# Patient Record
Sex: Female | Born: 1950 | Race: White | Hispanic: No | Marital: Married | Smoking: Never smoker
Health system: Southern US, Community
[De-identification: ages and names within clinical notes are randomized; demographics above are authoritative.]

## PROBLEM LIST (undated history)

## (undated) DIAGNOSIS — C541 Malignant neoplasm of endometrium: Secondary | ICD-10-CM

## (undated) DIAGNOSIS — E039 Hypothyroidism, unspecified: Secondary | ICD-10-CM

## (undated) DIAGNOSIS — Z923 Personal history of irradiation: Secondary | ICD-10-CM

## (undated) DIAGNOSIS — N9489 Other specified conditions associated with female genital organs and menstrual cycle: Secondary | ICD-10-CM

## (undated) DIAGNOSIS — M19071 Primary osteoarthritis, right ankle and foot: Secondary | ICD-10-CM

## (undated) DIAGNOSIS — G629 Polyneuropathy, unspecified: Secondary | ICD-10-CM

## (undated) DIAGNOSIS — G709 Myoneural disorder, unspecified: Secondary | ICD-10-CM

## (undated) DIAGNOSIS — C801 Malignant (primary) neoplasm, unspecified: Secondary | ICD-10-CM

## (undated) DIAGNOSIS — N950 Postmenopausal bleeding: Secondary | ICD-10-CM

## (undated) HISTORY — PX: COSMETIC SURGERY: SHX468

## (undated) HISTORY — PX: ABDOMINAL HYSTERECTOMY: SHX81

## (undated) HISTORY — DX: Personal history of irradiation: Z92.3

## (undated) HISTORY — PX: ABDOMINOPLASTY: SUR9

## (undated) HISTORY — PX: COLONOSCOPY: SHX174

## (undated) HISTORY — PX: APPENDECTOMY: SHX54

---

## 1974-03-22 HISTORY — PX: OVARIAN CYST REMOVAL: SHX89

## 1996-03-22 DIAGNOSIS — C801 Malignant (primary) neoplasm, unspecified: Secondary | ICD-10-CM

## 1996-03-22 HISTORY — DX: Malignant (primary) neoplasm, unspecified: C80.1

## 1996-04-22 HISTORY — PX: THYROIDECTOMY: SHX17

## 1997-08-22 ENCOUNTER — Other Ambulatory Visit: Admission: RE | Admit: 1997-08-22 | Discharge: 1997-08-22 | Payer: Self-pay | Admitting: Obstetrics and Gynecology

## 1997-11-03 ENCOUNTER — Inpatient Hospital Stay (HOSPITAL_COMMUNITY): Admission: AD | Admit: 1997-11-03 | Discharge: 1997-11-06 | Payer: Self-pay | Admitting: Obstetrics and Gynecology

## 1999-01-01 ENCOUNTER — Other Ambulatory Visit: Admission: RE | Admit: 1999-01-01 | Discharge: 1999-01-01 | Payer: Self-pay | Admitting: Obstetrics and Gynecology

## 2000-08-02 ENCOUNTER — Other Ambulatory Visit: Admission: RE | Admit: 2000-08-02 | Discharge: 2000-08-02 | Payer: Self-pay | Admitting: Obstetrics and Gynecology

## 2000-11-07 ENCOUNTER — Encounter: Payer: Self-pay | Admitting: Neurology

## 2000-11-07 ENCOUNTER — Ambulatory Visit (HOSPITAL_COMMUNITY): Admission: RE | Admit: 2000-11-07 | Discharge: 2000-11-07 | Payer: Self-pay | Admitting: Neurology

## 2001-09-13 ENCOUNTER — Ambulatory Visit (HOSPITAL_COMMUNITY): Admission: RE | Admit: 2001-09-13 | Discharge: 2001-09-13 | Payer: Self-pay | Admitting: Gastroenterology

## 2004-12-10 ENCOUNTER — Encounter (INDEPENDENT_AMBULATORY_CARE_PROVIDER_SITE_OTHER): Payer: Self-pay | Admitting: Specialist

## 2004-12-10 ENCOUNTER — Ambulatory Visit (HOSPITAL_COMMUNITY): Admission: RE | Admit: 2004-12-10 | Discharge: 2004-12-10 | Payer: Self-pay | Admitting: Obstetrics and Gynecology

## 2007-09-06 ENCOUNTER — Ambulatory Visit (HOSPITAL_BASED_OUTPATIENT_CLINIC_OR_DEPARTMENT_OTHER): Admission: RE | Admit: 2007-09-06 | Discharge: 2007-09-06 | Payer: Self-pay | Admitting: Orthopedic Surgery

## 2008-09-16 ENCOUNTER — Encounter: Admission: RE | Admit: 2008-09-16 | Discharge: 2008-09-16 | Payer: Self-pay | Admitting: Orthopedic Surgery

## 2010-08-04 NOTE — Op Note (Signed)
NAMENABILAH, [name]                   ACCOUNT NO.:  [id number]   MEDICAL RECORD NO.:  [id number]          PATIENT TYPE:  AMB   LOCATION:  DSC                          FACILITY:  [facility]   PHYSICIAN:  Feliberto Gottron. Turner Daniels, M.D.   DATE OF BIRTH:  [birth year]   DATE OF PROCEDURE:  09/06/2007  DATE OF DISCHARGE:  09/06/2007                               OPERATIVE REPORT   PREOPERATIVE DIAGNOSIS:  Left knee chondromalacia.   POSTOPERATIVE DIAGNOSIS:  Left knee chondromalacia patella, grade 2-3,  apex.   PROCEDURE:  Left knee arthroscopic debridement, chondromalacia patella.   SURGEON:  Feliberto Gottron.  Turner Daniels, MD   FIRST ASSISTANT:  None.   ANESTHETIC:  Local IV sedation plus general LMA.   ESTIMATED BLOOD LOSS:  Minimal.   FLUID REPLACEMENT:  500 mL of crystalloid.   DRAINS PLACED:  None.   TOURNIQUET TIME:  None.   INDICATIONS FOR PROCEDURE:  A 60 year old woman with symptomatic  catching and popping in the subpatellar region of her left knee that has  failed conservative treatment and desires elective arthroscopic  evaluation and treatment of her knee.  Risks and benefits of surgery  were discussed and questions were answered.   DESCRIPTION OF PROCEDURE:  The patient was identified by armband, taken  to the operating room at [facility].  Appropriate  anesthetic monitors were attached, and general LMA anesthesia induced  with the patient in supine position.  She also had local anesthesia.  A  total of 20 mL of 0.5% Marcaine with epinephrine was injected into the  knee and into the inferomedial and inferolateral parapatellar portal  regions after a time-out procedure had been performed.  After the  induction of general anesthesia and administration of the local  anesthetic under sterile conditions, lateral post was applied to the  table, and the left lower extremity was prepped and draped in sterile  fashion from the ankle to the midthigh.  Using a #11 blade, standard  inferomedial and inferolateral parapatellar portals were then made  allowing introduction of the arthroscope through the inferolateral  portal and the outflow through the inferomedial portal.  Diagnostic  arthroscopy revealed focal grade II-III chondromalacia at the apex of  the patella, which was lightly debrided, and some anterior synovium was  incidentally removed.  Moving into the medial compartment, the articular  and meniscal cartilages were in excellent condition.  Moving into the  notch, the ACL and PCL were noted to be in good shape, and moving  laterally, the articular and meniscal cartilages were in excellent  condition.  The gutters were cleared medially and laterally.  The  arthroscopic instruments were  then positioned, so we looked into the posterior compartments of the  knee.  The knee was irrigated out with normal saline solution.  Instruments were removed.  A dressing of Xeroform, 4 x 4 dressing  sponges, Webril, and an Ace wrap applied.  The patient was then awakened  and taken to the recovery room without difficulty.      Feliberto Gottron. Turner Daniels, M.D.  Electronically Signed  FJR/MEDQ  D:  09/25/2007  T:  09/26/2007  Job:  [id number]

## 2010-08-07 NOTE — Procedures (Signed)
[place]  Patient:    [name], [name] Visit Number: [id number] MRN: [id number]          Service Type: END Location: ENDO Attending Physician:  Charna Elizabeth Dictated by:   Anselmo Rod, M.D. Proc. Date: 09/13/01 Admit Date:  09/13/2001   CC:         Cordelia Pen A. Rosalio Macadamia, M.D.  Dr. Lanora Manis ________   Procedure Report  DATE OF BIRTH:  Jan 04, 1951  REFERRING PHYSICIAN:  Cordelia Pen A. Rosalio Macadamia, M.D.  PROCEDURE PERFORMED:  Colonoscopy.  ENDOSCOPIST:  Anselmo Rod, M.D.  INSTRUMENT USED:  Olympus video colonoscope.  INDICATIONS FOR PROCEDURE:  The patient is a 60 year old white female undergoing screening colonoscopy.  Rule out colonic polyps, masses, hemorrhoids, etc.  PREPROCEDURE PREPARATION:  Informed consent was procured from the patient. The patient was fasted for eight hours prior to the procedure and prepped with a bottle of magnesium citrate and a gallon of NuLytely the night prior to the procedure.  PREPROCEDURE PHYSICAL:  The patient had stable vital signs.  Neck supple. Chest clear to auscultation.  S1, S2 regular.  Abdomen soft with normal bowel sounds.  DESCRIPTION OF PROCEDURE:  The patient was placed in the left lateral decubitus position and sedated with 100 mg of Demerol and 10 mg of Versed intravenously.  Once the patient was adequately sedated and maintained on low-flow oxygen and continuous cardiac monitoring, the Olympus video colonoscope was advanced from the rectum to the cecum with slight difficulty. There was some residual stool in the colon.  Multiple washes were done.  There was evidence of mild diffuse melanosis coli throughout the colon.  No masses, polyps, erosions, ulcerations or diverticula were seen.  The procedure was complete up to the cecum.  The appendiceal orifice and ileocecal valve were clearly visualized and photographed.  Small lesions could have been missed secondary to the residual  stool in the colon.  IMPRESSION: 1. No masses, polyps or diverticulosis noted. 2. Mild evidence of melanosis coli throughout the colonic mucosa. 3. Some residual stool in the colon.  Small lesions could have been missed.  RECOMMENDATIONS: 1. A high fiber diet was recommended along with liberal fluid intact. 2. Avoidance of all laxatives has been encouraged.  Stool softeners are to be    used as needed. 3. Repeat colorectal cancer screening is recommended in the next five to 10    years unless the patient develops any abnormal symptoms in the interim. Dictated by:   Anselmo Rod, M.D. Attending Physician:  Charna Elizabeth DD:  09/13/01 TD:  09/14/01 Job: [id number]

## 2010-08-07 NOTE — Op Note (Signed)
[name], [name]                   ACCOUNT NO.:  [id number]   MEDICAL RECORD NO.:  [id number]          PATIENT TYPE:  AMB   LOCATION:  SDC                           FACILITY:  [facility]   PHYSICIAN:  Sherry A. Dickstein, M.D.DATE OF BIRTH:  12-05-1950   DATE OF PROCEDURE:  12/10/2004  DATE OF DISCHARGE:                                 OPERATIVE REPORT   PREOPERATIVE DIAGNOSIS:  Hydrosalpinx.   POSTOPERATIVE DIAGNOSIS:  Hydrosalpinx.   PROCEDURE:  Laparoscopic bilateral salpingectomy.   SURGEONS:  Sherry A. Rosalio Macadamia, M.D., Chester Holstein. Earlene Plater, M.D.   ANESTHESIA:  General.   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  Because of the chronic nature of  the problem, the patient has requested removal of her fallopian tubes.  However, she has requested the conservative surgery.  The patient requests  leaving her ovary in place and leaving her uterus in place.   FINDINGS:  Slightly enlarged anteflexed uterus, right hydrosalpinx.  No  right ovary present.  Left hydrosalpinx with left ovary present.  Sigmoid  colon adhesions to left fallopian tube.   PROCEDURE:  The patient was brought into the operating room and given  adequate general anesthesia after slightly difficult intubation that was  long but very successful.  The patient was placed in a dorsal lithotomy  position.  Her abdomen and the vagina were washed with Betadine, a Foley  catheter was inserted into the bladder, a speculum was placed within the  vagina.  Cervix was grasped with a single-toothed tenaculum, single-tooth  Hulka tenaculum was placed on the cervix.  First tenaculum and speculum were  removed from the vagina.  Surgeon's gown and gloves were changed.  The  patient was draped in sterile fashion.  Subumbilical area was infiltrated  0.25%% Marcaine.  Incision was made under the umbilicus.  The fascia was identified and grasped with Kocher clamps.  The  fascia was  incised, fascial edges were identified and a pursestring stitch was taken  with 0 Vicryl.  The posterior sheath was identified and incised sharply and  the Hulka trocar was introduced into the peritoneal cavity.  This was  cinched down with a Vicryl suture.  Carbon dioxide side was insufflated.  The pelvis was inspected and it was felt that the hydrosalpinx could be  removed through the laparoscope.  A right median incision was made after  infiltrating with 0.25% Marcaine and under direct visualization, a 5 mm  trocar was introduced.  A left median incision was made after infiltrating  with 0.25% Marcaine and 10/11 trocar was introduced with a Z fashion under  direct visualization.  The right infundibulopelvic ligament was identified.  It was grasped well above the ureter and using tripolar cautery was  cauterized in three places and cut.  The mesosalpinx was cauterized and cut  across to the ovary until the fallopian tube was free.  The fallopian tube  was then placed in an EndoCatch bag through the left median incision and  brought through the abdominal  cavity.  It had to be ruptured to be able to  be removed through the EndoCatch bag, and it was sent separately.  The  trocar was then replaced into the abdominal cavity.  The left fallopian tube  had to be dissected free very carefully with Endoshears from the left  sigmoid colon.  This was done with blunt and sharp dissection. The ovary was  dissected free.  The blood supply to the left fallopian tube was cauterized  with tripolar cautery and the fallopian tube was dissected free from the  ovary with tripolar cautery until it was completely separate.  It was then  put in a bag and the fluid was opened while in the bag and suctioned free,  and the bag was removed to the lateral port.  The fallopian tube was sent  separately.  There was very small amount of bleeding from the sigmoid colon  mesentery where it had been dissected  from the fallopian tube.  This was  felt to be very minimal.  The left ovary was bleeding very minimally, and  this was cauterized.  The uterus was felt to be normal.  The entire pelvis  was visualized and felt to be normal.  No other abnormalities were seen.  The upper abdomen was inspected and felt to be normal.  The pelvis was  irrigated and suctioned.  All carbon dioxide was then allowed to escape.  The incisions were then closed.  The subumbilical incision was closed first  by tying the Vicryl pursestring stitch, and all skin incisions were closed  using Monocryl subcuticular running stitches.  The skin was then closed  using Dermabond.  Adequate hemostasis was present throughout.  The trocar  was removed from the vagina.  The Foley catheter was removed.  The patient  taken out of the dorsal lithotomy position.  She was awakened.  She was  moved from the operating table to a stretcher in the stable condition.  Complications were none.  Estimated blood loss was less than 5 mL.      Sherry A. Rosalio Macadamia, M.D.  Electronically Signed     SAD/MEDQ  D:  12/10/2004  T:  12/10/2004  Job:  [id number]

## 2010-12-17 LAB — POCT HEMOGLOBIN-HEMACUE: Hemoglobin: 13.1

## 2012-02-29 ENCOUNTER — Other Ambulatory Visit: Payer: Self-pay | Admitting: Plastic Surgery

## 2015-03-23 HISTORY — PX: SHOULDER SURGERY: SHX246

## 2016-02-09 ENCOUNTER — Other Ambulatory Visit: Payer: Self-pay | Admitting: Orthopedic Surgery

## 2016-02-25 ENCOUNTER — Encounter (HOSPITAL_BASED_OUTPATIENT_CLINIC_OR_DEPARTMENT_OTHER): Payer: Self-pay | Admitting: *Deleted

## 2016-03-02 NOTE — H&P (Signed)
[name] is an 65 y.o. female.    Chief Complaint: Right Great Toe Pain  HPI: [name] is a 65 year old female that is presenting with right great toe pain.  This pain has been ongoing for the past 6 months.  She noticed it more when she was running on a treadmill after her shoulder surgery.  She denies any prior surgery in this area.  She has redness and swelling after she runs on this foot.  She has a history of idiopathic neuropathy.  She has pain over the first MTP joint of the right foot.  Past Medical History:  Diagnosis Date  . Cancer ([place]) 1998   thyroid ca  . Degenerative arthritis of toe joint, right    great toe  . Hypothyroidism   . Neuropathy ([place])    feet    Past Surgical History:  Procedure Laterality Date  . ABDOMINOPLASTY    . APPENDECTOMY    . COSMETIC SURGERY    . OVARIAN CYST REMOVAL  1976  . SHOULDER SURGERY Right 03/2015   by dr [name] at [facility]      History reviewed. No pertinent family history. Social History:  reports that she has never smoked. She has never used smokeless tobacco. She reports that she drinks alcohol. She reports that she does not use drugs.  Allergies: No Known Allergies  No prescriptions prior to admission.    No results found for this or any previous visit (from the past 48 hour(s)). No results found.  Review of Systems  Constitutional: Negative.   HENT: Negative.   Eyes:       Reading glasses  Respiratory: Negative.   Cardiovascular: Negative.   Gastrointestinal: Negative.   Genitourinary: Negative.   Musculoskeletal: Positive for joint pain.  Skin: Negative.   Neurological: Negative.   Endo/Heme/Allergies: Negative.   Psychiatric/Behavioral: Negative.     Height 5\' 9"  (1.753 m), weight 68 kg (150 lb). Physical Exam  Constitutional: She is oriented to person, place, and time. She appears well-developed and well-nourished.  HENT:  Head: Normocephalic and atraumatic.  Eyes: Pupils are equal, round,  and reactive to light.  Neck: Normal range of motion. Neck supple.  Cardiovascular: Intact distal pulses.   Respiratory: Effort normal.  Musculoskeletal: She exhibits tenderness.  Large dorsal osteophyte, Redness over the first MTP joint.  Limited flexion and extension of that first MTP joint.  Slight hallux valgus.  Some loss of the transverse arch.  Longitudinal arch seems to be preserved.  No abnormal callus formation on the plantar aspect.  Normal range of motion of the ankle.  Normal strength.  Neurovascularly intact.  Neurological: She is alert and oriented to person, place, and time.  Skin: Skin is warm and dry.  Psychiatric: She has a normal mood and affect. Her behavior is normal. Judgment and thought content normal.     Assessment/Plan Assessment: Right great toe pain likely related to hallux valgus and arthritic change.   Plan: She was provided and injection of her first MTP joint.  She would also like to be placed on the schedule for consideration of surgery today for right great toe chielectomy. The benefits risks and potential complications of surgery are discussed. The risks include but are not limited to infection,blood clots, difficulty with anesthesia, and damage to bone tendon nerves and soft tissues. She wishes to proceed with surgery. She is to follow-up postop.  Demitri Kucinski R, PA-C 03/02/2016, 8:02 AM

## 2016-03-03 ENCOUNTER — Encounter (HOSPITAL_BASED_OUTPATIENT_CLINIC_OR_DEPARTMENT_OTHER): Payer: Self-pay | Admitting: Anesthesiology

## 2016-03-03 ENCOUNTER — Ambulatory Visit (HOSPITAL_BASED_OUTPATIENT_CLINIC_OR_DEPARTMENT_OTHER)
Admission: RE | Admit: 2016-03-03 | Discharge: 2016-03-03 | Disposition: A | Discharge status: Home / Self Care | Payer: BC Managed Care – PPO | Source: Ambulatory Visit | Attending: Orthopedic Surgery | Admitting: Orthopedic Surgery

## 2016-03-03 ENCOUNTER — Encounter (HOSPITAL_BASED_OUTPATIENT_CLINIC_OR_DEPARTMENT_OTHER)
Admission: RE | Disposition: A | Discharge status: Home / Self Care | Payer: Self-pay | Source: Ambulatory Visit | Attending: Orthopedic Surgery

## 2016-03-03 ENCOUNTER — Ambulatory Visit (HOSPITAL_BASED_OUTPATIENT_CLINIC_OR_DEPARTMENT_OTHER): Payer: BC Managed Care – PPO | Admitting: Anesthesiology

## 2016-03-03 DIAGNOSIS — Z01818 Encounter for other preprocedural examination: Secondary | ICD-10-CM

## 2016-03-03 DIAGNOSIS — Z79899 Other long term (current) drug therapy: Secondary | ICD-10-CM | POA: Diagnosis not present

## 2016-03-03 DIAGNOSIS — E039 Hypothyroidism, unspecified: Secondary | ICD-10-CM | POA: Insufficient documentation

## 2016-03-03 DIAGNOSIS — M21611 Bunion of right foot: Secondary | ICD-10-CM | POA: Insufficient documentation

## 2016-03-03 DIAGNOSIS — M2021 Hallux rigidus, right foot: Secondary | ICD-10-CM | POA: Diagnosis not present

## 2016-03-03 DIAGNOSIS — M2011 Hallux valgus (acquired), right foot: Secondary | ICD-10-CM | POA: Insufficient documentation

## 2016-03-03 DIAGNOSIS — Z85850 Personal history of malignant neoplasm of thyroid: Secondary | ICD-10-CM | POA: Diagnosis not present

## 2016-03-03 DIAGNOSIS — M19071 Primary osteoarthritis, right ankle and foot: Secondary | ICD-10-CM | POA: Diagnosis not present

## 2016-03-03 HISTORY — PX: CHEILECTOMY: SHX1336

## 2016-03-03 HISTORY — DX: Primary osteoarthritis, right ankle and foot: M19.071

## 2016-03-03 HISTORY — PX: BUNIONECTOMY WITH CHILECTOMY: SHX5598

## 2016-03-03 HISTORY — DX: Malignant (primary) neoplasm, unspecified: C80.1

## 2016-03-03 HISTORY — DX: Polyneuropathy, unspecified: G62.9

## 2016-03-03 HISTORY — DX: Hypothyroidism, unspecified: E03.9

## 2016-03-03 SURGERY — CHEILECTOMY
Anesthesia: Monitor Anesthesia Care | Site: Foot | Laterality: Right

## 2016-03-03 MED ORDER — MIDAZOLAM HCL 2 MG/2ML IJ SOLN
INTRAMUSCULAR | Status: AC
  Filled 2016-03-03: qty 2

## 2016-03-03 MED ORDER — FENTANYL CITRATE (PF) 100 MCG/2ML IJ SOLN
INTRAMUSCULAR | Status: AC
  Filled 2016-03-03: qty 2

## 2016-03-03 MED ORDER — DEXAMETHASONE SODIUM PHOSPHATE 10 MG/ML IJ SOLN
INTRAMUSCULAR | Status: AC
  Filled 2016-03-03: qty 1

## 2016-03-03 MED ORDER — ONDANSETRON HCL 4 MG/2ML IJ SOLN
INTRAMUSCULAR | Status: AC
  Filled 2016-03-03: qty 2

## 2016-03-03 MED ORDER — SCOPOLAMINE 1 MG/3DAYS TD PT72: 1.0000 | MEDICATED_PATCH | Freq: Once | TRANSDERMAL | Status: DC | PRN

## 2016-03-03 MED ORDER — ONDANSETRON HCL 4 MG/2ML IJ SOLN
INTRAMUSCULAR | Status: DC | PRN
  Administered 2016-03-03: 4 mg via INTRAVENOUS

## 2016-03-03 MED ORDER — DEXTROSE-NACL 5-0.45 % IV SOLN: INTRAVENOUS | Status: DC

## 2016-03-03 MED ORDER — BUPIVACAINE HCL (PF) 0.5 % IJ SOLN
INTRAMUSCULAR | Status: DC | PRN
  Administered 2016-03-03 (×2): 30 mL

## 2016-03-03 MED ORDER — FENTANYL CITRATE (PF) 100 MCG/2ML IJ SOLN
50.0000 ug | INTRAMUSCULAR | Status: DC | PRN
  Administered 2016-03-03: 50 ug via INTRAVENOUS

## 2016-03-03 MED ORDER — HYDROMORPHONE HCL 1 MG/ML IJ SOLN: 0.2500 mg | INTRAMUSCULAR | Status: DC | PRN

## 2016-03-03 MED ORDER — FENTANYL CITRATE (PF) 100 MCG/2ML IJ SOLN
INTRAMUSCULAR | Status: DC | PRN
  Administered 2016-03-03: 100 ug via INTRAVENOUS

## 2016-03-03 MED ORDER — LACTATED RINGERS IV SOLN
INTRAVENOUS | Status: DC
  Administered 2016-03-03 (×2): via INTRAVENOUS

## 2016-03-03 MED ORDER — HYDROCODONE-ACETAMINOPHEN 5-325 MG PO TABS: 1.0000 | ORAL_TABLET | Freq: Four times a day (QID) | ORAL | 0 refills | Status: DC | PRN

## 2016-03-03 MED ORDER — CEFAZOLIN SODIUM-DEXTROSE 2-4 GM/100ML-% IV SOLN
2.0000 g | INTRAVENOUS | Status: AC
  Administered 2016-03-03: 2 g via INTRAVENOUS

## 2016-03-03 MED ORDER — LIDOCAINE 2% (20 MG/ML) 5 ML SYRINGE
INTRAMUSCULAR | Status: AC
Start: 2016-03-03 — End: 2016-03-03
  Filled 2016-03-03: qty 5

## 2016-03-03 MED ORDER — FENTANYL CITRATE (PF) 100 MCG/2ML IJ SOLN: 25.0000 ug | INTRAMUSCULAR | Status: DC | PRN

## 2016-03-03 MED ORDER — MIDAZOLAM HCL 2 MG/2ML IJ SOLN
1.0000 mg | INTRAMUSCULAR | Status: DC | PRN
  Administered 2016-03-03: 1 mg via INTRAVENOUS

## 2016-03-03 MED ORDER — LIDOCAINE HCL (CARDIAC) 20 MG/ML IV SOLN
INTRAVENOUS | Status: DC | PRN
  Administered 2016-03-03: 10 mg via INTRAVENOUS

## 2016-03-03 MED ORDER — PROPOFOL 10 MG/ML IV BOLUS
INTRAVENOUS | Status: DC | PRN
  Administered 2016-03-03: 30 mg via INTRAVENOUS
  Administered 2016-03-03: 10 mg via INTRAVENOUS
  Administered 2016-03-03 (×2): 20 mg via INTRAVENOUS
  Administered 2016-03-03: 30 mg via INTRAVENOUS
  Administered 2016-03-03: 10 mg via INTRAVENOUS
  Administered 2016-03-03: 20 mg via INTRAVENOUS

## 2016-03-03 MED ORDER — PROPOFOL 10 MG/ML IV BOLUS
INTRAVENOUS | Status: AC
  Filled 2016-03-03: qty 20

## 2016-03-03 MED ORDER — LIDOCAINE-EPINEPHRINE (PF) 1.5 %-1:200000 IJ SOLN
INTRAMUSCULAR | Status: DC | PRN
  Administered 2016-03-03: 10 mL
  Administered 2016-03-03: 10 mL via PERINEURAL

## 2016-03-03 MED ORDER — CHLORHEXIDINE GLUCONATE 4 % EX LIQD: 60.0000 mL | Freq: Once | CUTANEOUS | Status: DC

## 2016-03-03 MED ORDER — MIDAZOLAM HCL 5 MG/5ML IJ SOLN
INTRAMUSCULAR | Status: DC | PRN
  Administered 2016-03-03: 1 mg via INTRAVENOUS
  Administered 2016-03-03: 2 mg via INTRAVENOUS

## 2016-03-03 MED ORDER — CEFAZOLIN SODIUM-DEXTROSE 2-4 GM/100ML-% IV SOLN
INTRAVENOUS | Status: AC
  Filled 2016-03-03: qty 100

## 2016-03-03 SURGICAL SUPPLY — 61 items
BANDAGE ACE 4X5 VEL STRL LF (GAUZE/BANDAGES/DRESSINGS) ×4 IMPLANT
BLADE OSC/SAG .038X5.5 CUT EDG (BLADE) IMPLANT
BLADE SURG 10 STRL SS (BLADE) ×2 IMPLANT
BLADE SURG 15 STRL LF DISP TIS (BLADE) ×2 IMPLANT
BLADE SURG 15 STRL SS (BLADE) ×4
BNDG CMPR 9X4 STRL LF SNTH (GAUZE/BANDAGES/DRESSINGS) ×1
BNDG ESMARK 4X9 LF (GAUZE/BANDAGES/DRESSINGS) ×2 IMPLANT
BOOT STEPPER DURA LG (SOFTGOODS) IMPLANT
BOOT STEPPER DURA MED (SOFTGOODS) IMPLANT
BOOT STEPPER DURA SM (SOFTGOODS) IMPLANT
CANISTER SUCT 1200ML W/VALVE (MISCELLANEOUS) IMPLANT
COVER BACK TABLE 60X90IN (DRAPES) ×2 IMPLANT
COVER MAYO STAND STRL (DRAPES) ×2 IMPLANT
CUFF TOURNIQUET SINGLE 18IN (TOURNIQUET CUFF) ×1 IMPLANT
CUFF TOURNIQUET SINGLE 34IN LL (TOURNIQUET CUFF) ×1 IMPLANT
DECANTER SPIKE VIAL GLASS SM (MISCELLANEOUS) IMPLANT
DRAPE EXTREMITY T 121X128X90 (DRAPE) ×2 IMPLANT
DRAPE IMP U-DRAPE 54X76 (DRAPES) ×2 IMPLANT
DRAPE OEC MINIVIEW 54X84 (DRAPES) IMPLANT
DRAPE SURG 17X23 STRL (DRAPES) ×2 IMPLANT
DURAPREP 26ML APPLICATOR (WOUND CARE) ×2 IMPLANT
ELECT REM PT RETURN 9FT ADLT (ELECTROSURGICAL) ×2
ELECTRODE REM PT RTRN 9FT ADLT (ELECTROSURGICAL) ×1 IMPLANT
GAUZE SPONGE 4X4 16PLY XRAY LF (GAUZE/BANDAGES/DRESSINGS) IMPLANT
GAUZE XEROFORM 1X8 LF (GAUZE/BANDAGES/DRESSINGS) ×2 IMPLANT
GLOVE BIO SURGEON STRL SZ7.5 (GLOVE) ×2 IMPLANT
GLOVE BIO SURGEON STRL SZ8.5 (GLOVE) ×2 IMPLANT
GLOVE BIOGEL PI IND STRL 7.0 (GLOVE) IMPLANT
GLOVE BIOGEL PI IND STRL 8 (GLOVE) ×1 IMPLANT
GLOVE BIOGEL PI IND STRL 9 (GLOVE) ×1 IMPLANT
GLOVE BIOGEL PI INDICATOR 7.0 (GLOVE) ×2
GLOVE BIOGEL PI INDICATOR 8 (GLOVE) ×1
GLOVE BIOGEL PI INDICATOR 9 (GLOVE) ×1
GLOVE ECLIPSE 6.5 STRL STRAW (GLOVE) ×1 IMPLANT
GOWN STRL REUS W/ TWL LRG LVL3 (GOWN DISPOSABLE) ×1 IMPLANT
GOWN STRL REUS W/TWL LRG LVL3 (GOWN DISPOSABLE) ×2
GOWN STRL REUS W/TWL XL LVL3 (GOWN DISPOSABLE) ×2 IMPLANT
NEEDLE HYPO 22GX1.5 SAFETY (NEEDLE) IMPLANT
NS IRRIG 1000ML POUR BTL (IV SOLUTION) ×2 IMPLANT
PACK BASIN DAY SURGERY FS (CUSTOM PROCEDURE TRAY) ×2 IMPLANT
PAD CAST 4YDX4 CTTN HI CHSV (CAST SUPPLIES) ×1 IMPLANT
PADDING CAST ABS 4INX4YD NS (CAST SUPPLIES) ×1
PADDING CAST ABS COTTON 4X4 ST (CAST SUPPLIES) ×1 IMPLANT
PADDING CAST COTTON 4X4 STRL (CAST SUPPLIES) ×2
PENCIL BUTTON HOLSTER BLD 10FT (ELECTRODE) ×1 IMPLANT
SLEEVE SCD COMPRESS KNEE MED (MISCELLANEOUS) IMPLANT
SPONGE LAP 18X18 X RAY DECT (DISPOSABLE) ×1 IMPLANT
SUCTION FRAZIER HANDLE 10FR (MISCELLANEOUS)
SUCTION TUBE FRAZIER 10FR DISP (MISCELLANEOUS) IMPLANT
SUT ETHILON 3 0 PS 1 (SUTURE) IMPLANT
SUT ETHILON 4 0 PS 2 18 (SUTURE) ×2 IMPLANT
SUT TICRON 1 T 12 (SUTURE) IMPLANT
SUT VIC AB 2-0 SH 27 (SUTURE)
SUT VIC AB 2-0 SH 27XBRD (SUTURE) IMPLANT
SUT VIC AB 3-0 FS2 27 (SUTURE) ×1 IMPLANT
SYR BULB 3OZ (MISCELLANEOUS) ×2 IMPLANT
SYR CONTROL 10ML LL (SYRINGE) IMPLANT
TOWEL OR 17X24 6PK STRL BLUE (TOWEL DISPOSABLE) ×2 IMPLANT
TUBE CONNECTING 20X1/4 (TUBING) IMPLANT
UNDERPAD 30X30 (UNDERPADS AND DIAPERS) ×2 IMPLANT
YANKAUER SUCT BULB TIP NO VENT (SUCTIONS) IMPLANT

## 2016-03-03 NOTE — Interval H&P Note (Signed)
History and Physical Interval Note:  03/03/2016 11:17 AM  [name]  has presented today for surgery, with the diagnosis of RIGHT GREAT TOE DEGENERATIVE JOINT DISEASE  The various methods of treatment have been discussed with the patient and family. After consideration of risks, benefits and other options for treatment, the patient has consented to  Procedure(s): CHEILECTOMY RIGHT GREAT TOE (Right) as a surgical intervention .  The patient's history has been reviewed, patient examined, no change in status, stable for surgery.  I have reviewed the patient's chart and labs.  Questions were answered to the patient's satisfaction.     Kerin Salen

## 2016-03-03 NOTE — Anesthesia Procedure Notes (Signed)
Anesthesia Regional Block:  Ankle block  Pre-Anesthetic Checklist: ,, timeout performed, Correct Patient, Correct Site, Correct Laterality, Correct Procedure, Correct Position, site marked, Risks and benefits discussed, pre-op evaluation,  At surgeon's request and post-op pain management  Laterality: Right  Prep: chloraprep       Needles:  Injection technique: Single-shot     Needle Length: 4cm 4 cm Needle Gauge: 21 and 21 G    Additional Needles:  Procedures: other  (field Block) Ankle block Narrative:  Start time: 03/03/2016 10:56 AM End time: 03/03/2016 11:04 AM  Performed by: Personally  Anesthesiologist: Lyndle Herrlich  Additional Notes: 30cc .5% Marcaine + 10cc 1.5% Xylo 1:200 mixture = 40cc

## 2016-03-03 NOTE — Anesthesia Procedure Notes (Signed)
Procedure Name: MAC Date/Time: 03/03/2016 11:34 AM Performed by: Marrianne Mood Pre-anesthesia Checklist: Patient identified, Timeout performed, Emergency Drugs available and Suction available Patient Re-evaluated:Patient Re-evaluated prior to inductionOxygen Delivery Method: Simple face mask Preoxygenation: Pre-oxygenation with 100% oxygen

## 2016-03-03 NOTE — Discharge Instructions (Signed)
°  Post Anesthesia Home Care Instructions ° °Activity: °Get plenty of rest for the remainder of the day. A responsible adult should stay with you for 24 hours following the procedure.  °For the next 24 hours, DO NOT: °-Drive a car °-Operate machinery °-Drink alcoholic beverages °-Take any medication unless instructed by your physician °-Make any legal decisions or sign important papers. ° °Meals: °Start with liquid foods such as gelatin or soup. Progress to regular foods as tolerated. Avoid greasy, spicy, heavy foods. If nausea and/or vomiting occur, drink only clear liquids until the nausea and/or vomiting subsides. Call your physician if vomiting continues. ° °Special Instructions/Symptoms: °Your throat may feel dry or sore from the anesthesia or the breathing tube placed in your throat during surgery. If this causes discomfort, gargle with warm salt water. The discomfort should disappear within 24 hours. ° °If you had a scopolamine patch placed behind your ear for the management of post- operative nausea and/or vomiting: ° °1. The medication in the patch is effective for 72 hours, after which it should be removed.  Wrap patch in a tissue and discard in the trash. Wash hands thoroughly with soap and water. °2. You may remove the patch earlier than 72 hours if you experience unpleasant side effects which may include dry mouth, dizziness or visual disturbances. °3. Avoid touching the patch. Wash your hands with soap and water after contact with the patch. °  °Regional Anesthesia Blocks ° °1. Numbness or the inability to move the "blocked" extremity may last from 3-48 hours after placement. The length of time depends on the medication injected and your individual response to the medication. If the numbness is not going away after 48 hours, call your surgeon. ° °2. The extremity that is blocked will need to be protected until the numbness is gone and the  Strength has returned. Because you cannot feel it, you will need  to take extra care to avoid injury. Because it may be weak, you may have difficulty moving it or using it. You may not know what position it is in without looking at it while the block is in effect. ° °3. For blocks in the legs and feet, returning to weight bearing and walking needs to be done carefully. You will need to wait until the numbness is entirely gone and the strength has returned. You should be able to move your leg and foot normally before you try and bear weight or walk. You will need someone to be with you when you first try to ensure you do not fall and possibly risk injury. ° °4. Bruising and tenderness at the needle site are common side effects and will resolve in a few days. ° °5. Persistent numbness or new problems with movement should be communicated to the surgeon or the [facility] ([phone])/  Surgery Center ([phone]). °

## 2016-03-03 NOTE — H&P (View-Only) (Signed)
Assisted Dr. Lyndle Herrlich with right, ankle block. Side rails up, monitors on throughout procedure. See vital signs in flow sheet. Tolerated Procedure well.

## 2016-03-03 NOTE — Progress Notes (Signed)
Assisted Dr. Lyndle Herrlich with right, ankle block. Side rails up, monitors on throughout procedure. See vital signs in flow sheet. Tolerated Procedure well.

## 2016-03-03 NOTE — Op Note (Signed)
Pre Op Dx: Right great toe MTP hallux rigidus and bunion deformity  Post Op Dx: Same  Procedure: Right great toe MTP joint cheilectomy and modified McBride bunion correction  Surgeon: Kerin Salen, MD  Assistant: Kerry Hough. Barton Dubois  (present throughout entire procedure and necessary for timely completion of the procedure)  Anesthesia: General  EBL: Minimal  Fluids: 1000 mL  Tourniquet Time: 25 minutes  Indications: 65 year old female with right great toe hallux rigidus with a very large dorsal osteophyte as well as a mild to moderate bunion deformity with a first second toe intermetatarsal angle of 11 and and MTP hallux valgus angle of 20. Patient is failed conservative treatment with anti-inflammatory medicines modified shoe wear and stretching exercises saw significant pain and deformity to the right great toe she desires elective cheilectomy and bunion correction plan is to perform the cheilectomy and then possibly a modified McBride procedure to get rid of the hallux valgus deformity. Wrist benefits of surgery been discussed and all questions answered.  Procedure: Patient identified by arm band receive preoperative right ankle block anesthetic [facility]. She is taken to operating room 5 the appropriate sec monitors were attached and IV sedation was administered. A tourniquet was applied to the right ankle and the right foot prepped and draped in usual sterile fashion from the toes to the tourniquet. A timeout procedure was performed. The limb was wrapped with an Esmarch bandage tourniquet inflated to 250 mmHg and we began the operation by making a standard dorsal lateral incision from 1.5 cm distal to the MTP joint and 3 cm proximal to the MTP joint. The incision went through the skin and into the subcutaneous tissue down to the capsule over the MTP joint which was incised longitudinally exposing the very large dorsal osteophyte measured 2.5 cm x  1.2 cm x 8 mm. Placing small Cobra retractors we isolated the spur and removed with a small anterior cruciate ligament saw. The edges were smoothed off. After this was accomplished the majority of the bunion deformity was actually corrected. We then went into the first second intermetatarsal space at the MTP joint and cut the tendon to the adductor fenestrated the lateral capsule to complete the modified McBride release. Moving to the medial side we placed a V incision in the capsule allowing [place] to repeat the medial capsule completing the correction of the bunion deformity. Tourniquet was let down small bleeders identified and cauterized with the bipolar we then closed the capsular and periosteal incision with running 3-0 Vicryl suture the subcutaneous tissue and subcuticular closures were accomplished with the same suture. Dressing of Xerofoam 4 x 4 dressing sponges with 20 years between the toes and a great toe spica 4 x 4 followed by web roll and an Ace wrap was then applied followed by a postop shoe. Patient was then taken to the recovery without difficulty.

## 2016-03-03 NOTE — Transfer of Care (Signed)
Immediate Anesthesia Transfer of Care Note  Patient: [name]  Procedure(s) Performed: Procedure(s): CHEILECTOMY RIGHT GREAT TOE (Right) BUNIONECTOMY WITH CHILECTOMY (Right)  Patient Location: PACU  Anesthesia Type:MAC combined with regional for post-op pain  Level of Consciousness: awake and patient cooperative  Airway & Oxygen Therapy: Patient Spontanous Breathing and Patient connected to face mask oxygen  Post-op Assessment: Report given to RN and Post -op Vital signs reviewed and stable  Post vital signs: Reviewed and stable  Last Vitals:  Vitals:   03/03/16 1100 03/03/16 1105  BP: 111/61 119/64  Pulse: 78 77  Resp:  13  Temp:      Last Pain:  Vitals:   03/03/16 1034  TempSrc: Oral         Complications: No apparent anesthesia complications

## 2016-03-03 NOTE — Anesthesia Postprocedure Evaluation (Signed)
Anesthesia Post Note  Patient: [name]  Procedure(s) Performed: Procedure(s) (LRB): CHEILECTOMY RIGHT GREAT TOE (Right) BUNIONECTOMY WITH CHILECTOMY (Right)  Patient location during evaluation: PACU Anesthesia Type: General Level of consciousness: awake Pain management: pain level controlled Vital Signs Assessment: post-procedure vital signs reviewed and stable Respiratory status: spontaneous breathing Cardiovascular status: stable Anesthetic complications: no    Last Vitals:  Vitals:   03/03/16 1245 03/03/16 1300  BP: (!) 101/56   Pulse: 69 65  Resp: 15 (!) 25  Temp:      Last Pain:  Vitals:   03/03/16 1300  TempSrc:   PainSc: 0-No pain                 Tandi Hanko

## 2016-03-03 NOTE — Anesthesia Preprocedure Evaluation (Addendum)
Anesthesia Evaluation  Patient identified by MRN, date of birth, ID band Patient awake    Reviewed: Allergy & Precautions, H&P , Patient's Chart, lab work & pertinent test results, reviewed documented beta blocker date and time   Airway Mallampati: II  TM Distance: >3 FB Neck ROM: full    Dental no notable dental hx.    Pulmonary    Pulmonary exam normal breath sounds clear to auscultation       Cardiovascular  Rhythm:regular Rate:Normal     Neuro/Psych    GI/Hepatic   Endo/Other    Renal/GU      Musculoskeletal   Abdominal   Peds  Hematology   Anesthesia Other Findings   Reproductive/Obstetrics                            Anesthesia Physical Anesthesia Plan  ASA: II  Anesthesia Plan: General   Post-op Pain Management:  Regional for Post-op pain   Induction: Intravenous  Airway Management Planned: Mask  Additional Equipment:   Intra-op Plan:   Post-operative Plan:   Informed Consent: I have reviewed the patients History and Physical, chart, labs and discussed the procedure including the risks, benefits and alternatives for the proposed anesthesia with the patient or authorized representative who has indicated his/her understanding and acceptance.   Dental Advisory Given and Dental advisory given  Plan Discussed with: CRNA and Surgeon  Anesthesia Plan Comments: (Discussed GA with LMA, possible sore throat, potential need to switch to ETT, N/V, pulmonary aspiration. Questions answered. )     Anesthesia Quick Evaluation

## 2016-03-04 ENCOUNTER — Encounter (HOSPITAL_BASED_OUTPATIENT_CLINIC_OR_DEPARTMENT_OTHER): Payer: Self-pay | Admitting: Orthopedic Surgery

## 2016-03-05 NOTE — Addendum Note (Signed)
Addendum  created 03/05/16 1512 by Lyndle Herrlich, MD   Anesthesia Intra Meds edited, Sign clinical note

## 2016-03-09 NOTE — Addendum Note (Signed)
Addendum  created 03/09/16 1415 by Lyndle Herrlich, MD   Sign clinical note

## 2016-03-09 NOTE — Addendum Note (Signed)
Addendum  created 03/09/16 1943 by Belinda Block, MD   Sign clinical note

## 2016-03-10 ENCOUNTER — Encounter (HOSPITAL_BASED_OUTPATIENT_CLINIC_OR_DEPARTMENT_OTHER): Payer: Self-pay | Admitting: Orthopedic Surgery

## 2016-03-10 NOTE — Addendum Note (Signed)
Addendum  created 03/10/16 1251 by Lyndle Herrlich, MD   SmartForm saved

## 2016-09-28 ENCOUNTER — Other Ambulatory Visit: Payer: Self-pay | Admitting: Obstetrics & Gynecology

## 2016-10-04 ENCOUNTER — Other Ambulatory Visit: Payer: Self-pay

## 2016-10-04 ENCOUNTER — Encounter (HOSPITAL_COMMUNITY): Payer: Self-pay

## 2016-10-04 ENCOUNTER — Encounter (HOSPITAL_COMMUNITY)
Admission: RE | Admit: 2016-10-04 | Discharge: 2016-10-04 | Disposition: A | Discharge status: Home / Self Care | Payer: Medicare Other | Source: Ambulatory Visit | Attending: Obstetrics & Gynecology | Admitting: Obstetrics & Gynecology

## 2016-10-04 DIAGNOSIS — Z01818 Encounter for other preprocedural examination: Secondary | ICD-10-CM | POA: Insufficient documentation

## 2016-10-04 DIAGNOSIS — N950 Postmenopausal bleeding: Secondary | ICD-10-CM | POA: Insufficient documentation

## 2016-10-04 DIAGNOSIS — Z01812 Encounter for preprocedural laboratory examination: Secondary | ICD-10-CM | POA: Diagnosis not present

## 2016-10-04 DIAGNOSIS — Z0183 Encounter for blood typing: Secondary | ICD-10-CM | POA: Insufficient documentation

## 2016-10-04 LAB — BASIC METABOLIC PANEL
Anion gap: 6 (ref 5–15)
BUN: 22 mg/dL — ABNORMAL HIGH (ref 6–20)
CO2: 27 mmol/L (ref 22–32)
Calcium: 9.1 mg/dL (ref 8.9–10.3)
Chloride: 107 mmol/L (ref 101–111)
Creatinine, Ser: 0.64 mg/dL (ref 0.44–1.00)
GFR calc Af Amer: >60 mL/min (ref >60)
GFR calc non Af Amer: >60 mL/min (ref >60)
Glucose, Bld: 85 mg/dL (ref 65–99)
Potassium: 4.2 mmol/L (ref 3.5–5.1)
Sodium: 140 mmol/L (ref 135–145)

## 2016-10-04 LAB — CBC
HCT: 36.2 % (ref 36.0–46.0)
Hemoglobin: 12.2 g/dL (ref 12.0–15.0)
MCH: 32.1 pg (ref 26.0–34.0)
MCHC: 33.7 g/dL (ref 30.0–36.0)
MCV: 95.3 fL (ref 78.0–100.0)
Platelets: 176 10*3/uL (ref 150–400)
RBC: 3.8 MIL/uL — ABNORMAL LOW (ref 3.87–5.11)
RDW: 12.8 % (ref 11.5–15.5)
WBC: 6.9 10*3/uL (ref 4.0–10.5)

## 2016-10-04 LAB — TYPE AND SCREEN
ABO/RH(D): O POS
Antibody Screen: NEGATIVE

## 2016-10-04 LAB — ABO/RH: ABO/RH(D): O POS

## 2016-10-04 NOTE — Patient Instructions (Signed)
Your procedure is scheduled on:  Friday, October 15, 2016  Enter through the [employer] [facility] at:  12:45 PM  Pick up the phone at the desk and dial [phone].  Call this number if you have problems the morning of surgery: [phone].  Remember: Do NOT eat food:  After Midnight Thursday  Do NOT drink clear liquids after:  8:00 AM day of surgery  Take these medicines the morning of surgery with a SIP OF WATER:  Levothyroxine  Stop ALL herbal medications at this time  Do NOT smoke the day of surgery.  Do NOT wear jewelry (body piercing), metal hair clips/bobby pins, make-up, artifical eyelashes or nail polish. Do NOT wear lotions, powders, or perfumes.  You may wear deodorant. Do NOT shave for 48 hours prior to surgery. Do NOT bring valuables to the hospital. Contacts, dentures, or bridgework may not be worn into surgery.  Have a responsible adult drive you home and stay with you for 24 hours after your procedure  Bring a copy of your healthcare power of attorney and living will documents.

## 2016-10-14 MED ORDER — CEFAZOLIN SODIUM-DEXTROSE 2-4 GM/100ML-% IV SOLN: 2.0000 g | INTRAVENOUS | Status: AC

## 2016-10-15 ENCOUNTER — Ambulatory Visit (HOSPITAL_COMMUNITY)
Admission: RE | Admit: 2016-10-15 | Discharge: 2016-10-15 | Disposition: A | Discharge status: Home / Self Care | Payer: Medicare Other | Source: Ambulatory Visit | Attending: Obstetrics & Gynecology | Admitting: Obstetrics & Gynecology

## 2016-10-15 ENCOUNTER — Ambulatory Visit (HOSPITAL_COMMUNITY): Payer: Medicare Other | Admitting: Anesthesiology

## 2016-10-15 ENCOUNTER — Encounter (HOSPITAL_COMMUNITY): Payer: Self-pay

## 2016-10-15 ENCOUNTER — Encounter (HOSPITAL_COMMUNITY)
Admission: RE | Disposition: A | Discharge status: Home / Self Care | Payer: Self-pay | Source: Ambulatory Visit | Attending: Obstetrics & Gynecology

## 2016-10-15 DIAGNOSIS — N950 Postmenopausal bleeding: Secondary | ICD-10-CM | POA: Diagnosis not present

## 2016-10-15 DIAGNOSIS — Z79899 Other long term (current) drug therapy: Secondary | ICD-10-CM | POA: Insufficient documentation

## 2016-10-15 DIAGNOSIS — N882 Stricture and stenosis of cervix uteri: Secondary | ICD-10-CM | POA: Diagnosis not present

## 2016-10-15 DIAGNOSIS — N9489 Other specified conditions associated with female genital organs and menstrual cycle: Secondary | ICD-10-CM

## 2016-10-15 DIAGNOSIS — E039 Hypothyroidism, unspecified: Secondary | ICD-10-CM | POA: Insufficient documentation

## 2016-10-15 DIAGNOSIS — C541 Malignant neoplasm of endometrium: Secondary | ICD-10-CM | POA: Insufficient documentation

## 2016-10-15 DIAGNOSIS — Z85850 Personal history of malignant neoplasm of thyroid: Secondary | ICD-10-CM | POA: Insufficient documentation

## 2016-10-15 HISTORY — DX: Postmenopausal bleeding: N95.0

## 2016-10-15 HISTORY — DX: Other specified conditions associated with female genital organs and menstrual cycle: N94.89

## 2016-10-15 HISTORY — PX: DILATATION & CURETTAGE/HYSTEROSCOPY WITH MYOSURE: SHX6511

## 2016-10-15 SURGERY — DILATATION & CURETTAGE/HYSTEROSCOPY WITH MYOSURE
Anesthesia: General

## 2016-10-15 MED ORDER — DEXAMETHASONE SODIUM PHOSPHATE 10 MG/ML IJ SOLN
INTRAMUSCULAR | Status: AC
  Filled 2016-10-15: qty 1

## 2016-10-15 MED ORDER — ACETAMINOPHEN 160 MG/5ML PO SOLN
975.0000 mg | Freq: Once | ORAL | Status: AC
  Administered 2016-10-15: 975 mg via ORAL

## 2016-10-15 MED ORDER — PROPOFOL 10 MG/ML IV BOLUS
INTRAVENOUS | Status: DC | PRN
  Administered 2016-10-15: 160 mg via INTRAVENOUS

## 2016-10-15 MED ORDER — KETOROLAC TROMETHAMINE 30 MG/ML IJ SOLN
INTRAMUSCULAR | Status: AC
  Filled 2016-10-15: qty 1

## 2016-10-15 MED ORDER — LIDOCAINE HCL (CARDIAC) 20 MG/ML IV SOLN
INTRAVENOUS | Status: AC
  Filled 2016-10-15: qty 5

## 2016-10-15 MED ORDER — DEXAMETHASONE SODIUM PHOSPHATE 10 MG/ML IJ SOLN
INTRAMUSCULAR | Status: DC | PRN
  Administered 2016-10-15: 10 mg via INTRAVENOUS

## 2016-10-15 MED ORDER — FENTANYL CITRATE (PF) 250 MCG/5ML IJ SOLN
INTRAMUSCULAR | Status: AC
  Filled 2016-10-15: qty 5

## 2016-10-15 MED ORDER — LACTATED RINGERS IV SOLN
INTRAVENOUS | Status: DC
  Administered 2016-10-15: 125 mL/h via INTRAVENOUS
  Administered 2016-10-15: 15:00:00 via INTRAVENOUS

## 2016-10-15 MED ORDER — MIDAZOLAM HCL 2 MG/2ML IJ SOLN
INTRAMUSCULAR | Status: AC
  Filled 2016-10-15: qty 2

## 2016-10-15 MED ORDER — ONDANSETRON HCL 4 MG/2ML IJ SOLN
INTRAMUSCULAR | Status: DC | PRN
  Administered 2016-10-15: 4 mg via INTRAVENOUS

## 2016-10-15 MED ORDER — KETOROLAC TROMETHAMINE 30 MG/ML IJ SOLN
INTRAMUSCULAR | Status: DC | PRN
  Administered 2016-10-15: 15 mg via INTRAVENOUS

## 2016-10-15 MED ORDER — ONDANSETRON HCL 4 MG/2ML IJ SOLN
INTRAMUSCULAR | Status: AC
  Filled 2016-10-15: qty 2

## 2016-10-15 MED ORDER — PROPOFOL 10 MG/ML IV BOLUS
INTRAVENOUS | Status: AC
  Filled 2016-10-15: qty 40

## 2016-10-15 MED ORDER — LIDOCAINE HCL 1 % IJ SOLN
INTRAMUSCULAR | Status: AC
  Filled 2016-10-15: qty 20

## 2016-10-15 MED ORDER — MIDAZOLAM HCL 2 MG/2ML IJ SOLN
INTRAMUSCULAR | Status: DC | PRN
  Administered 2016-10-15: 1 mg via INTRAVENOUS

## 2016-10-15 MED ORDER — ACETAMINOPHEN 160 MG/5ML PO SOLN
ORAL | Status: AC
  Filled 2016-10-15: qty 40.6

## 2016-10-15 MED ORDER — LIDOCAINE HCL (CARDIAC) 20 MG/ML IV SOLN
INTRAVENOUS | Status: DC | PRN
  Administered 2016-10-15: 60 mg via INTRAVENOUS

## 2016-10-15 MED ORDER — FENTANYL CITRATE (PF) 100 MCG/2ML IJ SOLN
INTRAMUSCULAR | Status: DC | PRN
Start: 2016-10-15 — End: 2016-10-15
  Administered 2016-10-15: 50 ug via INTRAVENOUS
  Administered 2016-10-15: 25 ug via INTRAVENOUS
  Administered 2016-10-15: 50 ug via INTRAVENOUS
  Administered 2016-10-15: 25 ug via INTRAVENOUS
  Administered 2016-10-15 (×2): 50 ug via INTRAVENOUS

## 2016-10-15 MED ORDER — FENTANYL CITRATE (PF) 100 MCG/2ML IJ SOLN: 25.0000 ug | INTRAMUSCULAR | Status: DC | PRN

## 2016-10-15 SURGICAL SUPPLY — 17 items
CANISTER SUCT 3000ML PPV (MISCELLANEOUS) ×2 IMPLANT
CATH ROBINSON RED A/P 16FR (CATHETERS) ×2 IMPLANT
CLOTH BEACON ORANGE TIMEOUT ST (SAFETY) ×2 IMPLANT
CONTAINER PREFILL 10% NBF 60ML (FORM) ×4 IMPLANT
DEVICE MYOSURE LITE (MISCELLANEOUS) ×1 IMPLANT
DEVICE MYOSURE REACH (MISCELLANEOUS) IMPLANT
FILTER ARTHROSCOPY CONVERTOR (FILTER) ×2 IMPLANT
GLOVE BIO SURGEON STRL SZ7 (GLOVE) ×2 IMPLANT
GLOVE BIOGEL PI IND STRL 7.0 (GLOVE) ×2 IMPLANT
GLOVE BIOGEL PI INDICATOR 7.0 (GLOVE) ×2
GOWN STRL REUS W/TWL LRG LVL3 (GOWN DISPOSABLE) ×4 IMPLANT
PACK VAGINAL MINOR WOMEN LF (CUSTOM PROCEDURE TRAY) ×2 IMPLANT
PAD OB MATERNITY 4.3X12.25 (PERSONAL CARE ITEMS) ×2 IMPLANT
SEAL ROD LENS SCOPE MYOSURE (ABLATOR) ×2 IMPLANT
TOWEL OR 17X24 6PK STRL BLUE (TOWEL DISPOSABLE) ×4 IMPLANT
TUBING AQUILEX INFLOW (TUBING) ×2 IMPLANT
TUBING AQUILEX OUTFLOW (TUBING) ×2 IMPLANT

## 2016-10-15 NOTE — Op Note (Signed)
Preoperative diagnosis: Postmenopausal bleeding, endometrial mass/ thick endometrial stripe.                                       Cervical stenosis    Postop diagnosis: Same.   Procedure: Hysteroscopic resection of endometrial mass with Myosure Lite and dilatation and curettage Anesthesia General  Surgeon: [name], MD  IV fluids: LR 1 liter Estimated blood loss : 100 cc  Saline Fluid deficit: 1060 cc  Urine output: straight catheter preop 25 cc  Complications none  Condition stable  Disposition PACU  Specimen: Endometrial mass resected tissue, endometrial curetting, endometrial fluid  Procedure  Indication: 66 year old with postmenopausal bleeding and cervical stenosis, office pelvic sonogram noted thick irregular endometrium and endometrial mass, endometrial fluid. Office attempt of endometrial biopsy failed due to cervical stenosis despite Cytotec, so patient was advised procedure in the hospital. Patient was counseled on risks/ complications including infection, bleeding, damage to internal organs, she understood and agrees, gave informed written consent.  Patient was brought to the operating room with IV running. Time out was carried out. She received preop 2 gm Ancef. She underwent general anesthesia without complications. She was given dorsolithotomy position. Parts were prepped and draped in standard fashion. Bladder was catheterized once. Bimanual exam revealed uterus to be anteverted and small. Vaginal retractor placed and normal cervix with pin point cervical os noted. Cervix was grasped with single-tooth tenaculum. Cervical os was dilated initially with Lacrimal dilaros and then os finder followed by Kennon Rounds dilators. Copious turbid fluid drained from the os and was collected and sent for malignant cells.  Myosure Lite Hysteroscope was introduced in the uterine cavity under vision, using saline for irrigation. Findings: Bleeding noted in the endometrial cavity and after clearing it  a thick irregular vascular endometrium with a larger posterior wall irregular large mass noted. Hysteroscopic resection of endometrial mass and tissue done with Myosure Lite under vision. Then scope was removed and curettage performed with sharp curette carefully. Copious endometrial tissue was all put together with resected tissue and sent to pathology. Hysteroscopy done again, mass was essentially excised and bleeding was better.  All instruments removed.  All counts are correct x2. No complications. Patient brought to the recovery room in stable condition.  Discharge home with husband. Concerning findings of hysteroscopy including possible cancer reviewed with husband, awaiting pathology report in few days.   Follow up in 2 weeks in office. Warning signs of infection and excessive bleeding reviewed.   V.Zabria Liss, MD.

## 2016-10-15 NOTE — Anesthesia Procedure Notes (Signed)
Procedure Name: LMA Insertion Date/Time: 10/15/2016 2:57 PM Performed by: Flossie Dibble Pre-anesthesia Checklist: Patient identified, Timeout performed, Patient being monitored, Emergency Drugs available and Suction available Patient Re-evaluated:Patient Re-evaluated prior to induction Oxygen Delivery Method: Circle system utilized Preoxygenation: Pre-oxygenation with 100% oxygen Induction Type: IV induction LMA: LMA inserted LMA Size: 4.0 Placement Confirmation: positive ETCO2 and breath sounds checked- equal and bilateral Tube secured with: Tape Dental Injury: Teeth and Oropharynx as per pre-operative assessment

## 2016-10-15 NOTE — H&P (Signed)
[name] is an 66 y.o. female here for postmenopausal bleeding for few months. Office sono consistent with thick irregular endometrium, possible polyp, abnormal uterine flow. Stenotic cervix, failed to open with cytotec so could not have office sonohysterogram or endometrial biopsy.  Normal Pap, HPV neg 2016. Prior abn pap history. G0.  Prior DUB 10 yrs back, failed Mirena then as it caused infection then.      Past Medical History:  Diagnosis Date  . Cancer ([place]) 1998   thyroid ca  . Degenerative arthritis of toe joint, right    great toe  . Hypothyroidism   . Neuropathy    feet    Past Surgical History:  Procedure Laterality Date  . ABDOMINOPLASTY    . APPENDECTOMY    . BUNIONECTOMY WITH CHILECTOMY Right 03/03/2016   Procedure: Lillard Anes WITH CHILECTOMY;  Surgeon: [name], MD;  Location: [place];  Service: Orthopedics;  Laterality: Right;  . CHEILECTOMY Right 03/03/2016   Procedure: CHEILECTOMY RIGHT GREAT TOE;  Surgeon: [name], MD;  Location: [place];  Service: Orthopedics;  Laterality: Right;  . COLONOSCOPY    . COSMETIC SURGERY    . OVARIAN CYST REMOVAL  1976  . SHOULDER SURGERY Right 03/2015   by dr [name] at [facility]      History reviewed. No pertinent family history.  Social History:  reports that she has never smoked. She has never used smokeless tobacco. She reports that she drinks alcohol. She reports that she does not use drugs.  Allergies: No Known Allergies  Prescriptions Prior to Admission  Medication Sig Dispense Refill Last Dose  . ibuprofen (ADVIL,MOTRIN) 200 MG tablet Take 600 mg by mouth 2 (two) times daily as needed for moderate pain.   Past Month at Unknown time  . levothyroxine (SYNTHROID, LEVOTHROID) 150 MCG tablet Take 150 mcg by mouth daily before breakfast.   0 10/15/2016 at 0730  . OVER THE COUNTER MEDICATION Take 5 tablets by mouth 3 (three) times a week. Pyridoxal 1, 2, 3, and 4 otc  supplement   Past Week at Unknown time  . OVER THE COUNTER MEDICATION Take 1 tablet by mouth as directed. Sulfur 24 g7 otc supplement take 1 tablet daily 5 days out of the week   10/14/2016 at Unknown time  . OVER THE COUNTER MEDICATION Carbon 90 bx otc supplement take 1 capsule daily 5 days out of the week   More than a month at Unknown time    ROS neg   Blood pressure 131/64, pulse 70, temperature 98.1 F (36.7 C), temperature source Oral, resp. rate 16, SpO2 98 %. Physical Exam  A&O x 3, no acute distress. Pleasant HEENT neg, no thyromegaly Lungs CTA bilat CV RRR, S1S2 normal Abdo soft, non tender, non acute Extr no edema/ tenderness Pelvic cervix stenosed. Uterus 8 wks. Mobile, no adnexal masses  CBC    Component Value Date/Time   WBC 6.9 10/04/2016 1130   RBC 3.80 (L) 10/04/2016 1130   HGB 12.2 10/04/2016 1130   HCT 36.2 10/04/2016 1130   PLT 176 10/04/2016 1130   MCV 95.3 10/04/2016 1130   MCH 32.1 10/04/2016 1130   MCHC 33.7 10/04/2016 1130   RDW 12.8 10/04/2016 1130   Assessment/Plan: 65 yo female with postmenopausal bleeding here for D&C, hysteroscopy with myosure. At risk for failed attempt/ false passage/ uterine perforation. Risks/complications of surgery reviewed incl infection, bleeding, damage to internal organs including bladder, bowels, ureters, blood vessels,  other risks from anesthesia, VTE and delayed complications of any surgery, complications in future surgery reviewed If unable to perform hysteroscopy, will attempt endometrial biopsy. If not possible due to stenosis, plan MRI and Gynonc consult.  Patient understands and agrees.    Raeqwon Lux R 10/15/2016, 2:17 PM

## 2016-10-15 NOTE — Discharge Instructions (Signed)
Hysteroscopy, Care After  Refer to this sheet in the next few weeks. These instructions provide you with information on caring for yourself after your procedure. Your health care provider may also give you more specific instructions. Your treatment has been planned according to current medical practices, but problems sometimes occur. Call your health care provider if you have any problems or questions after your procedure.  What can I expect after the procedure?  After your procedure, it is typical to have the following:  · You may have some cramping. This normally lasts for a couple days.  · You may have bleeding. This can vary from light spotting for a few days to menstrual-like bleeding for 3-7 days.    Follow these instructions at home:  · Rest for the first 1-2 days after the procedure.  · Only take over-the-counter or prescription medicines as directed by your health care provider. Do not take aspirin. It can increase the chances of bleeding.  · Take showers instead of baths for 2 weeks or as directed by your health care provider.  · Do not drive for 24 hours or as directed.  · Do not drink alcohol while taking pain medicine.  · Do not use tampons, douche, or have sexual intercourse for 2 weeks or until your health care provider says it is okay.  · Take your temperature twice a day for 4-5 days. Write it down each time.  · Follow your health care provider's advice about diet, exercise, and lifting.  · If you develop constipation, you may:  ? Take a mild laxative if your health care provider approves.  ? Add bran foods to your diet.  ? Drink enough fluids to keep your urine clear or pale yellow.  · Try to have someone with you or available to you for the first 24-48 hours, especially if you were given a general anesthetic.  · Follow up with your health care provider as directed.  Contact a health care provider if:  · You feel dizzy or lightheaded.  · You feel sick to your stomach (nauseous).  · You have  abnormal vaginal discharge.  · You have a rash.  · You have pain that is not controlled with medicine.  Get help right away if:  · You have bleeding that is heavier than a normal menstrual period.  · You have a fever.  · You have increasing cramps or pain, not controlled with medicine.  · You have new belly (abdominal) pain.  · You pass out.  · You have pain in the tops of your shoulders (shoulder strap areas).  · You have shortness of breath.  This information is not intended to replace advice given to you by your health care provider. Make sure you discuss any questions you have with your health care provider.  Document Released: 12/27/2012 Document Revised: 08/14/2015 Document Reviewed: 10/05/2012  Elsevier Interactive Patient Education © 2017 [employer]

## 2016-10-15 NOTE — Transfer of Care (Signed)
Immediate Anesthesia Transfer of Care Note  Patient: [name]  Procedure(s) Performed: Procedure(s) with comments: DILATATION & CURETTAGE/HYSTEROSCOPY WITH MYOSURE (N/A) - Requests Microdilators and Lacrimal Duct Dilators  Patient Location: PACU  Anesthesia Type:General  Level of Consciousness: awake, alert  and oriented  Airway & Oxygen Therapy: Patient Spontanous Breathing and Patient connected to nasal cannula oxygen  Post-op Assessment: Report given to RN and Post -op Vital signs reviewed and stable  Post vital signs: Reviewed and stable  Last Vitals:  Vitals:   10/15/16 1305  BP: 131/64  Pulse: 70  Resp: 16  Temp: 36.7 C    Last Pain:  Vitals:   10/15/16 1305  TempSrc: Oral  PainSc: 1       Patients Stated Pain Goal: 3 (37/34/28 7681)  Complications: No apparent anesthesia complications

## 2016-10-15 NOTE — Anesthesia Preprocedure Evaluation (Signed)
Anesthesia Evaluation  Patient identified by MRN, date of birth, ID band Patient awake    Reviewed: Allergy & Precautions, H&P , Patient's Chart, lab work & pertinent test results, reviewed documented beta blocker date and time   Airway Mallampati: II  TM Distance: >3 FB Neck ROM: full    Dental no notable dental hx.    Pulmonary    Pulmonary exam normal breath sounds clear to auscultation       Cardiovascular  Rhythm:regular Rate:Normal     Neuro/Psych    GI/Hepatic   Endo/Other    Renal/GU      Musculoskeletal   Abdominal   Peds  Hematology   Anesthesia Other Findings   Reproductive/Obstetrics                             Anesthesia Physical Anesthesia Plan  ASA: II  Anesthesia Plan: General   Post-op Pain Management:    Induction: Intravenous  PONV Risk Score and Plan: 2 and Ondansetron, Dexamethasone and Treatment may vary due to age or medical condition  Airway Management Planned: LMA  Additional Equipment:   Intra-op Plan:   Post-operative Plan:   Informed Consent: I have reviewed the patients History and Physical, chart, labs and discussed the procedure including the risks, benefits and alternatives for the proposed anesthesia with the patient or authorized representative who has indicated his/her understanding and acceptance.   Dental Advisory Given  Plan Discussed with: CRNA and Surgeon  Anesthesia Plan Comments: ( )        Anesthesia Quick Evaluation

## 2016-10-16 NOTE — Anesthesia Postprocedure Evaluation (Signed)
Anesthesia Post Note  Patient: [name]  Procedure(s) Performed: Procedure(s) (LRB): DILATATION & CURETTAGE/HYSTEROSCOPY WITH MYOSURE (N/A)     Anesthesia Type: General    Last Vitals:  Vitals:   10/15/16 1700 10/15/16 1730  BP: (!) 143/72 132/68  Pulse: 68 70  Resp: 19 20  Temp:  36.6 C    Last Pain:  Vitals:   10/15/16 1700  TempSrc:   PainSc: 2    Pain Goal: Patients Stated Pain Goal: 3 (10/15/16 1305)               Lyndle Herrlich EDWARD

## 2016-10-17 ENCOUNTER — Encounter (HOSPITAL_COMMUNITY): Payer: Self-pay | Admitting: Obstetrics & Gynecology

## 2016-10-25 ENCOUNTER — Encounter: Payer: Self-pay | Admitting: Gynecology

## 2016-10-25 ENCOUNTER — Ambulatory Visit: Payer: Medicare Other | Attending: Gynecology | Admitting: Gynecology

## 2016-10-25 VITALS — BP 115/59 | HR 74 | Temp 98.5°F | Resp 20 | Ht 69.0 in | Wt 152.0 lb

## 2016-10-25 DIAGNOSIS — G629 Polyneuropathy, unspecified: Secondary | ICD-10-CM | POA: Insufficient documentation

## 2016-10-25 DIAGNOSIS — N950 Postmenopausal bleeding: Secondary | ICD-10-CM | POA: Insufficient documentation

## 2016-10-25 DIAGNOSIS — C541 Malignant neoplasm of endometrium: Secondary | ICD-10-CM | POA: Diagnosis present

## 2016-10-25 DIAGNOSIS — Z79899 Other long term (current) drug therapy: Secondary | ICD-10-CM | POA: Diagnosis not present

## 2016-10-25 DIAGNOSIS — Z85850 Personal history of malignant neoplasm of thyroid: Secondary | ICD-10-CM | POA: Insufficient documentation

## 2016-10-25 DIAGNOSIS — E039 Hypothyroidism, unspecified: Secondary | ICD-10-CM | POA: Diagnosis not present

## 2016-10-25 NOTE — Progress Notes (Signed)
Consult Note: Gyn-Onc   Novelle S Brandy 66 y.o. female  Chief Complaint  Patient presents with  . Endometrial Cancer    Assessment :Endometrioid adenocarcinoma the endometrium (grade 2)  Plan:The natural history and treatment options for endometrial cancer discussed with patient and her husband. I would recommend the patient undergo a robotically assisted laparoscopic hysterectomy, bilateral salpingo-oophorectomy, and sentinel node biopsy. The procedure as well as risks and benefits were discussed. Patient's aware of the risks of hemorrhage, infection, injury to adjacent viscera, anesthetic risks, and thromboembolic complications. She is also aware that adjuvant radiation therapy and/or chemotherapy may be recommended postoperatively. All her questions are answered. Surgery scheduled with Dr. Rossi on September 4,/2018 at the patient's request..   HPI: 66-year-old white married female seen in consultation at the request of Dr. Mody regarding management of a newly diagnosed endometrial carcinoma. The patient had some postmenopausal bleeding in December 2017 which by February had abated. Then returned more recently with heavier bleeding. Patient was found to have cervical stenosis and therefore underwent a formal D&C 10/15/2016. She was found to have a large endometrial polyp the grade 2 endometrial carcinoma. The patient's had an uncomplicated postoperative course. Patient has some past gynecologic history including a left oophorectomy in her teens for an ovarian cyst. 2006 she had bilateral salpingectomy for what I presume is hydrosalpinx. The patient is not on any hormone replacement therapy.    Review of Systems:10 point review of systems is negative except as noted in interval history.   Vitals: Blood pressure (!) 115/59, pulse 74, temperature 98.5 F (36.9 C), resp. rate 20, height 5' 9" (1.753 m), weight 152 lb (68.9 kg), SpO2 99 %.  Physical Exam: General : The patient is a healthy woman  in no acute distress.  HEENT: normocephalic, extraoccular movements normal; neck is supple without thyromegally  Lynphnodes: Supraclavicular and inguinal nodes not enlarged  Abdomen: Soft, non-tender, no ascites, no organomegally, no masses, no hernias  Pelvic:  EGBUS: Normal female  Vagina: Normal, no lesions  Urethra and Bladder: Normal, non-tender  Cervix: Normal, no lesions are noted Uterus: Anterior, normal shape size consistency  Bi-manual examination: Non-tender; no adenxal masses or nodularity  Rectal: normal sphincter tone, no masses, no blood  Lower extremities: No edema or varicosities. Normal range of motion      No Known Allergies  Past Medical History:  Diagnosis Date  . Cancer ([facility]) 1998   thyroid ca  . Degenerative arthritis of toe joint, right    great toe  . Endometrial mass 10/15/2016  . Hypothyroidism   . Neuropathy    feet  . Postmenopausal bleeding 10/15/2016    Past Surgical History:  Procedure Laterality Date  . ABDOMINOPLASTY    . APPENDECTOMY    . BUNIONECTOMY WITH CHILECTOMY Right 03/03/2016   Procedure: BUNIONECTOMY WITH CHILECTOMY;  Surgeon: Frank Rowan, MD;  Location: [facility];  Service: Orthopedics;  Laterality: Right;  . CHEILECTOMY Right 03/03/2016   Procedure: CHEILECTOMY RIGHT GREAT TOE;  Surgeon: Frank Rowan, MD;  [facility];  Service: Orthopedics;  Laterality: Right;  . COLONOSCOPY    . COSMETIC SURGERY    . DILATATION & CURETTAGE/HYSTEROSCOPY WITH MYOSURE N/A 10/15/2016   Procedure: DILATATION & CURETTAGE/HYSTEROSCOPY WITH MYOSURE;  Surgeon: Mody, Vaishali, MD;  Location: [facility];  Service: Gynecology;  Laterality: N/A;  Requests Microdilators and Lacrimal Duct Dilators  . OVARIAN CYST REMOVAL  1976  . SHOULDER SURGERY Right 03/2015   by dr rowan at   [place]  . THYROIDECTOMY      Current Outpatient Prescriptions  Medication Sig Dispense Refill  . ibuprofen (ADVIL,MOTRIN) 200 MG tablet Take 600 mg  by mouth 2 (two) times daily as needed for moderate pain.    . levothyroxine (SYNTHROID, LEVOTHROID) 150 MCG tablet Take 150 mcg by mouth daily before breakfast.   0  . OVER THE COUNTER MEDICATION Take 5 tablets by mouth 3 (three) times a week. Pyridoxal 1, 2, 3, and 4 otc supplement    . OVER THE COUNTER MEDICATION Carbon 90 bx otc supplement take 1 capsule daily 5 days out of the week    . OVER THE COUNTER MEDICATION Take 1 tablet by mouth as directed. Sulfur 24 g7 otc supplement take 1 tablet daily 5 days out of the week     No current facility-administered medications for this visit.     Social History   Social History  . Marital status: Married    Spouse name: N/A  . Number of children: N/A  . Years of education: N/A   Occupational History  . Not on file.   Social History Main Topics  . Smoking status: Never Smoker  . Smokeless tobacco: Never Used  . Alcohol use Yes     Comment: social  . Drug use: No  . Sexual activity: Not on file   Other Topics Concern  . Not on file   Social History Narrative  . No narrative on file    History reviewed. No pertinent family history.    Harlie Ragle Clarke-Pearson, MD 10/25/2016, 11:39 AM       

## 2016-10-25 NOTE — Patient Instructions (Addendum)
Preparing for your Surgery  Plan for surgery on November 23, 2016 with Dr. [name] at [facility] will be scheduled for a robotic assisted total hysterectomy, bilateral salpingo-oophorectomy, sentinel lymph node biopsy.    Pre-operative Testing -You will receive a phone call from presurgical testing at [facility] to arrange for a pre-operative testing appointment before your surgery.  This appointment normally occurs one to two weeks before your scheduled surgery.   -Bring your insurance card, copy of an advanced directive if applicable, medication list  -At that visit, you will be asked to sign a consent for a possible blood transfusion in case a transfusion becomes necessary during surgery.  The need for a blood transfusion is rare but having consent is a necessary part of your care.     -You should not be taking blood thinners or aspirin at least ten days prior to surgery unless instructed by your surgeon.  Day Before Surgery at [facility] will be asked to take in a light diet the day before surgery.  Avoid carbonated beverages.  You will be advised to have nothing to eat or drink after midnight the evening before.     Eat a light diet the day before surgery.  Examples including soups, broths, toast, yogurt, mashed potatoes.  Things to avoid include carbonated beverages (fizzy beverages), raw fruits and raw vegetables, or beans.    If your bowels are filled with gas, your surgeon will have difficulty visualizing your pelvic organs which increases your surgical risks.  Your role in recovery Your role is to become active as soon as directed by your doctor, while still giving yourself time to heal.  Rest when you feel tired. You will be asked to do the following in order to speed your recovery:  - Cough and breathe deeply. This helps toclear and expand your lungs and can prevent pneumonia. You may be given a spirometer to practice deep breathing. A  staff member will show you how to use the spirometer. - Do mild physical activity. Walking or moving your legs help your circulation and body functions return to normal. A staff member will help you when you try to walk and will provide you with simple exercises. Do not try to get up or walk alone the first time. - Actively manage your pain. Managing your pain lets you move in comfort. We will ask you to rate your pain on a scale of zero to 10. It is your responsibility to tell your doctor or nurse where and how much you hurt so your pain can be treated.  Special Considerations -If you are diabetic, you may be placed on insulin after surgery to have closer control over your blood sugars to promote healing and recovery.  This does not mean that you will be discharged on insulin.  If applicable, your oral antidiabetics will be resumed when you are tolerating a solid diet.  -Your final pathology results from surgery should be available by the Friday after surgery and the results will be relayed to you when available.   Blood Transfusion Information WHAT IS A BLOOD TRANSFUSION? A transfusion is the replacement of blood or some of its parts. Blood is made up of multiple cells which provide different functions.  Red blood cells carry oxygen and are used for blood loss replacement.  White blood cells fight against infection.  Platelets control bleeding.  Plasma helps clot blood.  Other blood products are available for specialized needs, such  as hemophilia or other clotting disorders. BEFORE THE TRANSFUSION  Who gives blood for transfusions?   You may be able to donate blood to be used at a later date on yourself (autologous donation).  Relatives can be asked to donate blood. This is generally not any safer than if you have received blood from a stranger. The same precautions are taken to ensure safety when a relative's blood is donated.  Healthy volunteers who are fully evaluated to make sure  their blood is safe. This is blood bank blood. Transfusion therapy is the safest it has ever been in the practice of medicine. Before blood is taken from a donor, a complete history is taken to make sure that person has no history of diseases nor engages in risky social behavior (examples are intravenous drug use or sexual activity with multiple partners). The donor's travel history is screened to minimize risk of transmitting infections, such as malaria. The donated blood is tested for signs of infectious diseases, such as HIV and hepatitis. The blood is then tested to be sure it is compatible with you in order to minimize the chance of a transfusion reaction. If you or a relative donates blood, this is often done in anticipation of surgery and is not appropriate for emergency situations. It takes many days to process the donated blood. RISKS AND COMPLICATIONS Although transfusion therapy is very safe and saves many lives, the main dangers of transfusion include:   Getting an infectious disease.  Developing a transfusion reaction. This is an allergic reaction to something in the blood you were given. Every precaution is taken to prevent this. The decision to have a blood transfusion has been considered carefully by your caregiver before blood is given. Blood is not given unless the benefits outweigh the risks.

## 2016-11-09 NOTE — Patient Instructions (Signed)
[name]  11/09/2016   Your procedure is scheduled on: 11-23-16   Report to [facility] Main  Entrance Take [place]  Elevators to 3rd floor to  [place] at 9:00 AM.   Call this number if you have problems the morning of surgery [phone]    Remember: ONLY 1 PERSON MAY GO WITH YOU TO SHORT STAY TO GET  READY MORNING OF [place].  Do not eat food or drink liquids :After Midnight.   Eat a light diet the day before surgery.  Examples including soups, broths, toast, yogurt, mashed potatoes.  Things to avoid include carbonated beverages (fizzy beverages), raw fruits and raw vegetables, or beans.   If your bowels are filled with gas, your surgeon will have difficulty visualizing your pelvic organs which increases your surgical risks.   Take these medicines the morning of surgery with A SIP OF WATER: Levothyroxine (Synthroid)                                You may not have any metal on your body including hair pins and              piercings  Do not wear jewelry, make-up, lotions, powders or perfumes, deodorant             Do not wear nail polish.  Do not shave  48 hours prior to surgery.                Do not bring valuables to the hospital. [employer].  Contacts, dentures or bridgework may not be worn into surgery.  Leave suitcase in the car. After surgery it may be brought to your room.     Please read over the following fact sheets you were given: _____________________________________________________________________             [facility] - Preparing for Surgery Before surgery, you can play an important role.  Because skin is not sterile, your skin needs to be as free of germs as possible.  You can reduce the number of germs on your skin by washing with CHG (chlorahexidine gluconate) soap before surgery.  CHG is an antiseptic cleaner which kills germs and bonds with the skin to continue killing  germs even after washing. Please DO NOT use if you have an allergy to CHG or antibacterial soaps.  If your skin becomes reddened/irritated stop using the CHG and inform your nurse when you arrive at Short Stay. Do not shave (including legs and underarms) for at least 48 hours prior to the first CHG shower.  You may shave your face/neck. Please follow these instructions carefully:  1.  Shower with CHG Soap the night before surgery and the  morning of Surgery.  2.  If you choose to wash your hair, wash your hair first as usual with your  normal  shampoo.  3.  After you shampoo, rinse your hair and body thoroughly to remove the  shampoo.                           4.  Use CHG as you would any other liquid soap.  You can apply chg directly  to the skin and wash                       Gently with a scrungie or clean washcloth.  5.  Apply the CHG Soap to your body ONLY FROM THE NECK DOWN.   Do not use on face/ open                           Wound or open sores. Avoid contact with eyes, ears mouth and genitals (private parts).                       Wash face,  Genitals (private parts) with your normal soap.             6.  Wash thoroughly, paying special attention to the area where your surgery  will be performed.  7.  Thoroughly rinse your body with warm water from the neck down.  8.  DO NOT shower/wash with your normal soap after using and rinsing off  the CHG Soap.                9.  Pat yourself dry with a clean towel.            10.  Wear clean pajamas.            11.  Place clean sheets on your bed the night of your first shower and do not  sleep with pets. Day of Surgery : Do not apply any lotions/deodorants the morning of surgery.  Please wear clean clothes to the hospital/surgery center.  FAILURE TO FOLLOW THESE INSTRUCTIONS MAY RESULT IN THE CANCELLATION OF YOUR SURGERY PATIENT SIGNATURE_________________________________  NURSE  SIGNATURE__________________________________  ________________________________________________________________________   Adam Phenix  An incentive spirometer is a tool that can help keep your lungs clear and active. This tool measures how well you are filling your lungs with each breath. Taking long deep breaths may help reverse or decrease the chance of developing breathing (pulmonary) problems (especially infection) following:  A long period of time when you are unable to move or be active. BEFORE THE PROCEDURE   If the spirometer includes an indicator to show your best effort, your nurse or respiratory therapist will set it to a desired goal.  If possible, sit up straight or lean slightly forward. Try not to slouch.  Hold the incentive spirometer in an upright position. INSTRUCTIONS FOR USE  1. Sit on the edge of your bed if possible, or sit up as far as you can in bed or on a chair. 2. Hold the incentive spirometer in an upright position. 3. Breathe out normally. 4. Place the mouthpiece in your mouth and seal your lips tightly around it. 5. Breathe in slowly and as deeply as possible, raising the piston or the ball toward the top of the column. 6. Hold your breath for 3-5 seconds or for as long as possible. Allow the piston or ball to fall to the bottom of the column. 7. Remove the mouthpiece from your mouth and breathe out normally. 8. Rest for a few seconds and repeat Steps 1 through 7 at least 10 times every 1-2 hours when you are awake. Take your time and take a few normal breaths between deep breaths. 9. The spirometer may include an indicator to show your best effort. Use the indicator as a goal to work toward during each repetition. 10. After each  set of 10 deep breaths, practice coughing to be sure your lungs are clear. If you have an incision (the cut made at the time of surgery), support your incision when coughing by placing a pillow or rolled up towels firmly  against it. Once you are able to get out of bed, walk around indoors and cough well. You may stop using the incentive spirometer when instructed by your caregiver.  RISKS AND COMPLICATIONS  Take your time so you do not get dizzy or light-headed.  If you are in pain, you may need to take or ask for pain medication before doing incentive spirometry. It is harder to take a deep breath if you are having pain. AFTER USE  Rest and breathe slowly and easily.  It can be helpful to keep track of a log of your progress. Your caregiver can provide you with a simple table to help with this. If you are using the spirometer at home, follow these instructions: [place] IF:   You are having difficultly using the spirometer.  You have trouble using the spirometer as often as instructed.  Your pain medication is not giving enough relief while using the spirometer.  You develop fever of 100.5 F (38.1 C) or higher. SEEK IMMEDIATE MEDICAL CARE IF:   You cough up bloody sputum that had not been present before.  You develop fever of 102 F (38.9 C) or greater.  You develop worsening pain at or near the incision site. MAKE SURE YOU:   Understand these instructions.  Will watch your condition.  Will get help right away if you are not doing well or get worse. Document Released: 07/19/2006 Document Revised: 05/31/2011 Document Reviewed: 09/19/2006 Exit[employer] Patient Information 2014 [employer], [place].   ________________________________________________________________________  WHAT IS A BLOOD TRANSFUSION? Blood Transfusion Information  A transfusion is the replacement of blood or some of its parts. Blood is made up of multiple cells which provide different functions.  Red blood cells carry oxygen and are used for blood loss replacement.  White blood cells fight against infection.  Platelets control bleeding.  Plasma helps clot blood.  Other blood products are available for  specialized needs, such as hemophilia or other clotting disorders. BEFORE THE TRANSFUSION  Who gives blood for transfusions?   Healthy volunteers who are fully evaluated to make sure their blood is safe. This is blood bank blood. Transfusion therapy is the safest it has ever been in the practice of medicine. Before blood is taken from a donor, a complete history is taken to make sure that person has no history of diseases nor engages in risky social behavior (examples are intravenous drug use or sexual activity with multiple partners). The donor's travel history is screened to minimize risk of transmitting infections, such as malaria. The donated blood is tested for signs of infectious diseases, such as HIV and hepatitis. The blood is then tested to be sure it is compatible with you in order to minimize the chance of a transfusion reaction. If you or a relative donates blood, this is often done in anticipation of surgery and is not appropriate for emergency situations. It takes many days to process the donated blood. RISKS AND COMPLICATIONS Although transfusion therapy is very safe and saves many lives, the main dangers of transfusion include:   Getting an infectious disease.  Developing a transfusion reaction. This is an allergic reaction to something in the blood you were given. Every precaution is taken to prevent this. The decision to have  a blood transfusion has been considered carefully by your caregiver before blood is given. Blood is not given unless the benefits outweigh the risks. AFTER THE TRANSFUSION  Right after receiving a blood transfusion, you will usually feel much better and more energetic. This is especially true if your red blood cells have gotten low (anemic). The transfusion raises the level of the red blood cells which carry oxygen, and this usually causes an energy increase.  The nurse administering the transfusion will monitor you carefully for complications. HOME CARE  INSTRUCTIONS  No special instructions are needed after a transfusion. You may find your energy is better. Speak with your caregiver about any limitations on activity for underlying diseases you may have. SEEK MEDICAL CARE IF:   Your condition is not improving after your transfusion.  You develop redness or irritation at the intravenous (IV) site. SEEK IMMEDIATE MEDICAL CARE IF:  Any of the following symptoms occur over the next 12 hours:  Shaking chills.  You have a temperature by mouth above 102 F (38.9 C), not controlled by medicine.  Chest, back, or muscle pain.  People around you feel you are not acting correctly or are confused.  Shortness of breath or difficulty breathing.  Dizziness and fainting.  You get a rash or develop hives.  You have a decrease in urine output.  Your urine turns a dark color or changes to pink, red, or brown. Any of the following symptoms occur over the next 10 days:  You have a temperature by mouth above 102 F (38.9 C), not controlled by medicine.  Shortness of breath.  Weakness after normal activity.  The white part of the eye turns yellow (jaundice).  You have a decrease in the amount of urine or are urinating less often.  Your urine turns a dark color or changes to pink, red, or brown. Document Released: 03/05/2000 Document Revised: 05/31/2011 Document Reviewed: 10/23/2007 [facility] Patient Information 2014 [place], [place].  _______________________________________________________________________

## 2016-11-11 ENCOUNTER — Encounter (HOSPITAL_COMMUNITY): Payer: Self-pay

## 2016-11-11 ENCOUNTER — Encounter (INDEPENDENT_AMBULATORY_CARE_PROVIDER_SITE_OTHER): Payer: Self-pay

## 2016-11-11 ENCOUNTER — Encounter (HOSPITAL_COMMUNITY)
Admission: RE | Admit: 2016-11-11 | Discharge: 2016-11-11 | Disposition: A | Discharge status: Home / Self Care | Payer: Medicare Other | Source: Ambulatory Visit | Attending: Gynecologic Oncology | Admitting: Gynecologic Oncology

## 2016-11-11 DIAGNOSIS — Z79899 Other long term (current) drug therapy: Secondary | ICD-10-CM | POA: Diagnosis not present

## 2016-11-11 DIAGNOSIS — Z9889 Other specified postprocedural states: Secondary | ICD-10-CM | POA: Insufficient documentation

## 2016-11-11 DIAGNOSIS — C541 Malignant neoplasm of endometrium: Secondary | ICD-10-CM | POA: Diagnosis not present

## 2016-11-11 DIAGNOSIS — Z01812 Encounter for preprocedural laboratory examination: Secondary | ICD-10-CM | POA: Insufficient documentation

## 2016-11-11 LAB — URINALYSIS, ROUTINE W REFLEX MICROSCOPIC
Bilirubin Urine: NEGATIVE
Glucose, UA: NEGATIVE mg/dL
Hgb urine dipstick: NEGATIVE
Ketones, ur: 5 mg/dL — AB
Nitrite: NEGATIVE
Protein, ur: 30 mg/dL — AB
Specific Gravity, Urine: 1.028 (ref 1.005–1.030)
pH: 5 (ref 5.0–8.0)

## 2016-11-11 LAB — COMPREHENSIVE METABOLIC PANEL
ALT: 17 U/L (ref 14–54)
AST: 20 U/L (ref 15–41)
Albumin: 4.2 g/dL (ref 3.5–5.0)
Alkaline Phosphatase: 64 U/L (ref 38–126)
Anion gap: 5 (ref 5–15)
BUN: 24 mg/dL — ABNORMAL HIGH (ref 6–20)
CO2: 26 mmol/L (ref 22–32)
Calcium: 9.1 mg/dL (ref 8.9–10.3)
Chloride: 109 mmol/L (ref 101–111)
Creatinine, Ser: 0.65 mg/dL (ref 0.44–1.00)
GFR calc Af Amer: >60 mL/min (ref >60)
GFR calc non Af Amer: >60 mL/min (ref >60)
Glucose, Bld: 91 mg/dL (ref 65–99)
Potassium: 4.8 mmol/L (ref 3.5–5.1)
Sodium: 140 mmol/L (ref 135–145)
Total Bilirubin: 0.5 mg/dL (ref 0.3–1.2)
Total Protein: 7.2 g/dL (ref 6.5–8.1)

## 2016-11-11 LAB — CBC
HCT: 35.5 % — ABNORMAL LOW (ref 36.0–46.0)
Hemoglobin: 11.9 g/dL — ABNORMAL LOW (ref 12.0–15.0)
MCH: 31.5 pg (ref 26.0–34.0)
MCHC: 33.5 g/dL (ref 30.0–36.0)
MCV: 93.9 fL (ref 78.0–100.0)
Platelets: 194 10*3/uL (ref 150–400)
RBC: 3.78 MIL/uL — ABNORMAL LOW (ref 3.87–5.11)
RDW: 12.6 % (ref 11.5–15.5)
WBC: 5.4 10*3/uL (ref 4.0–10.5)

## 2016-11-11 LAB — TYPE AND SCREEN
ABO/RH(D): O POS
Antibody Screen: NEGATIVE

## 2016-11-11 LAB — ABO/RH: ABO/RH(D): O POS

## 2016-11-12 LAB — URINE CULTURE: Culture: NO GROWTH

## 2016-11-12 NOTE — Progress Notes (Signed)
11-11-16 UA and CMP results routed to Dr. Denman George for review.

## 2016-11-23 ENCOUNTER — Ambulatory Visit (HOSPITAL_COMMUNITY): Payer: Medicare Other | Admitting: Certified Registered Nurse Anesthetist

## 2016-11-23 ENCOUNTER — Ambulatory Visit (HOSPITAL_COMMUNITY)
Admission: RE | Admit: 2016-11-23 | Discharge: 2016-11-24 | Disposition: A | Discharge status: Home / Self Care | Payer: Medicare Other | Source: Ambulatory Visit | Attending: Gynecologic Oncology | Admitting: Gynecologic Oncology

## 2016-11-23 ENCOUNTER — Encounter (HOSPITAL_COMMUNITY): Payer: Self-pay | Admitting: Certified Registered Nurse Anesthetist

## 2016-11-23 ENCOUNTER — Encounter (HOSPITAL_COMMUNITY)
Admission: RE | Disposition: A | Discharge status: Home / Self Care | Payer: Self-pay | Source: Ambulatory Visit | Attending: Gynecologic Oncology

## 2016-11-23 DIAGNOSIS — C541 Malignant neoplasm of endometrium: Secondary | ICD-10-CM | POA: Diagnosis not present

## 2016-11-23 DIAGNOSIS — C775 Secondary and unspecified malignant neoplasm of intrapelvic lymph nodes: Secondary | ICD-10-CM | POA: Insufficient documentation

## 2016-11-23 DIAGNOSIS — Z85850 Personal history of malignant neoplasm of thyroid: Secondary | ICD-10-CM | POA: Diagnosis not present

## 2016-11-23 DIAGNOSIS — M19071 Primary osteoarthritis, right ankle and foot: Secondary | ICD-10-CM | POA: Diagnosis not present

## 2016-11-23 DIAGNOSIS — E039 Hypothyroidism, unspecified: Secondary | ICD-10-CM | POA: Insufficient documentation

## 2016-11-23 DIAGNOSIS — Z23 Encounter for immunization: Secondary | ICD-10-CM | POA: Insufficient documentation

## 2016-11-23 DIAGNOSIS — N9489 Other specified conditions associated with female genital organs and menstrual cycle: Secondary | ICD-10-CM | POA: Diagnosis not present

## 2016-11-23 DIAGNOSIS — N800 Endometriosis of uterus: Secondary | ICD-10-CM | POA: Insufficient documentation

## 2016-11-23 DIAGNOSIS — D259 Leiomyoma of uterus, unspecified: Secondary | ICD-10-CM | POA: Diagnosis not present

## 2016-11-23 DIAGNOSIS — Z79899 Other long term (current) drug therapy: Secondary | ICD-10-CM | POA: Diagnosis not present

## 2016-11-23 HISTORY — PX: SENTINEL NODE BIOPSY: SHX6608

## 2016-11-23 HISTORY — PX: ROBOTIC ASSISTED TOTAL HYSTERECTOMY: SHX6085

## 2016-11-23 HISTORY — PX: ROBOTIC ASSISTED BILATERAL SALPINGO OOPHERECTOMY: SHX6078

## 2016-11-23 SURGERY — HYSTERECTOMY, TOTAL, ROBOT-ASSISTED
Anesthesia: General | Laterality: Bilateral

## 2016-11-23 MED ORDER — GABAPENTIN 300 MG PO CAPS
300.0000 mg | ORAL_CAPSULE | Freq: Every day | ORAL | Status: AC
  Administered 2016-11-23: 300 mg via ORAL
  Filled 2016-11-23: qty 1

## 2016-11-23 MED ORDER — DEXAMETHASONE SODIUM PHOSPHATE 10 MG/ML IJ SOLN
INTRAMUSCULAR | Status: AC
  Filled 2016-11-23: qty 1

## 2016-11-23 MED ORDER — OXYCODONE-ACETAMINOPHEN 5-325 MG PO TABS
1.0000 | ORAL_TABLET | ORAL | Status: DC | PRN
Start: 2016-11-23 — End: 2016-11-24

## 2016-11-23 MED ORDER — SENNOSIDES-DOCUSATE SODIUM 8.6-50 MG PO TABS
2.0000 | ORAL_TABLET | Freq: Every day | ORAL | Status: DC
  Administered 2016-11-23: 2 via ORAL
  Filled 2016-11-23: qty 2

## 2016-11-23 MED ORDER — ACETAMINOPHEN 500 MG PO TABS
1000.0000 mg | ORAL_TABLET | Freq: Two times a day (BID) | ORAL | Status: DC
  Administered 2016-11-23 – 2016-11-24 (×2): 1000 mg via ORAL
  Filled 2016-11-23 (×2): qty 2

## 2016-11-23 MED ORDER — CEFAZOLIN SODIUM-DEXTROSE 2-4 GM/100ML-% IV SOLN
2.0000 g | INTRAVENOUS | Status: AC
  Administered 2016-11-23: 2 g via INTRAVENOUS
  Filled 2016-11-23: qty 100

## 2016-11-23 MED ORDER — HYDROMORPHONE HCL-NACL 0.5-0.9 MG/ML-% IV SOSY
0.2500 mg | PREFILLED_SYRINGE | INTRAVENOUS | Status: DC | PRN
  Administered 2016-11-23 (×2): 0.5 mg via INTRAVENOUS

## 2016-11-23 MED ORDER — SCOPOLAMINE 1 MG/3DAYS TD PT72
MEDICATED_PATCH | TRANSDERMAL | Status: DC | PRN
  Administered 2016-11-23: 1 via TRANSDERMAL

## 2016-11-23 MED ORDER — DICLOFENAC SODIUM 50 MG PO TBEC
50.0000 mg | DELAYED_RELEASE_TABLET | Freq: Four times a day (QID) | ORAL | Status: DC
  Administered 2016-11-23 – 2016-11-24 (×3): 50 mg via ORAL
  Filled 2016-11-23 (×4): qty 1

## 2016-11-23 MED ORDER — STERILE WATER FOR INJECTION IJ SOLN
INTRAMUSCULAR | Status: AC
  Filled 2016-11-23: qty 10

## 2016-11-23 MED ORDER — PROMETHAZINE HCL 25 MG/ML IJ SOLN: 6.2500 mg | INTRAMUSCULAR | Status: DC | PRN

## 2016-11-23 MED ORDER — ROCURONIUM BROMIDE 50 MG/5ML IV SOSY
PREFILLED_SYRINGE | INTRAVENOUS | Status: AC
  Filled 2016-11-23: qty 5

## 2016-11-23 MED ORDER — SUGAMMADEX SODIUM 200 MG/2ML IV SOLN
INTRAVENOUS | Status: AC
  Filled 2016-11-23: qty 2

## 2016-11-23 MED ORDER — FENTANYL CITRATE (PF) 250 MCG/5ML IJ SOLN
INTRAMUSCULAR | Status: AC
  Filled 2016-11-23: qty 5

## 2016-11-23 MED ORDER — PROPOFOL 10 MG/ML IV BOLUS
INTRAVENOUS | Status: DC | PRN
  Administered 2016-11-23: 180 mg via INTRAVENOUS
  Administered 2016-11-23: 50 mg via INTRAVENOUS

## 2016-11-23 MED ORDER — MIDAZOLAM HCL 5 MG/5ML IJ SOLN
INTRAMUSCULAR | Status: DC | PRN
  Administered 2016-11-23: 2 mg via INTRAVENOUS

## 2016-11-23 MED ORDER — MIDAZOLAM HCL 2 MG/2ML IJ SOLN
INTRAMUSCULAR | Status: AC
  Filled 2016-11-23: qty 2

## 2016-11-23 MED ORDER — ACETAMINOPHEN 10 MG/ML IV SOLN
1000.0000 mg | Freq: Once | INTRAVENOUS | Status: AC
  Administered 2016-11-23: 1000 mg via INTRAVENOUS

## 2016-11-23 MED ORDER — LACTATED RINGERS IR SOLN
Status: DC | PRN
  Administered 2016-11-23: 1000 mL

## 2016-11-23 MED ORDER — ONDANSETRON HCL 4 MG/2ML IJ SOLN
INTRAMUSCULAR | Status: DC | PRN
  Administered 2016-11-23: 4 mg via INTRAVENOUS

## 2016-11-23 MED ORDER — ONDANSETRON HCL 4 MG PO TABS: 4.0000 mg | ORAL_TABLET | Freq: Four times a day (QID) | ORAL | Status: DC | PRN

## 2016-11-23 MED ORDER — ENOXAPARIN SODIUM 40 MG/0.4ML ~~LOC~~ SOLN
40.0000 mg | SUBCUTANEOUS | Status: DC
  Administered 2016-11-24: 40 mg via SUBCUTANEOUS
  Filled 2016-11-23: qty 0.4

## 2016-11-23 MED ORDER — ACETAMINOPHEN 10 MG/ML IV SOLN
INTRAVENOUS | Status: AC
  Filled 2016-11-23: qty 100

## 2016-11-23 MED ORDER — PHENYLEPHRINE 40 MCG/ML (10ML) SYRINGE FOR IV PUSH (FOR BLOOD PRESSURE SUPPORT)
PREFILLED_SYRINGE | INTRAVENOUS | Status: DC | PRN
  Administered 2016-11-23 (×2): 80 ug via INTRAVENOUS

## 2016-11-23 MED ORDER — ONDANSETRON HCL 4 MG/2ML IJ SOLN
INTRAMUSCULAR | Status: AC
  Filled 2016-11-23: qty 2

## 2016-11-23 MED ORDER — PNEUMOCOCCAL VAC POLYVALENT 25 MCG/0.5ML IJ INJ
0.5000 mL | INJECTION | INTRAMUSCULAR | Status: AC
  Administered 2016-11-24: 0.5 mL via INTRAMUSCULAR
  Filled 2016-11-23: qty 0.5

## 2016-11-23 MED ORDER — MIDAZOLAM HCL 2 MG/2ML IJ SOLN: 0.5000 mg | Freq: Once | INTRAMUSCULAR | Status: DC | PRN

## 2016-11-23 MED ORDER — FENTANYL CITRATE (PF) 100 MCG/2ML IJ SOLN
INTRAMUSCULAR | Status: DC | PRN
  Administered 2016-11-23 (×4): 50 ug via INTRAVENOUS

## 2016-11-23 MED ORDER — LIDOCAINE 2% (20 MG/ML) 5 ML SYRINGE
INTRAMUSCULAR | Status: AC
  Filled 2016-11-23: qty 5

## 2016-11-23 MED ORDER — KCL IN DEXTROSE-NACL 20-5-0.45 MEQ/L-%-% IV SOLN
INTRAVENOUS | Status: DC
  Administered 2016-11-23: 17:00:00 via INTRAVENOUS
  Filled 2016-11-23 (×2): qty 1000

## 2016-11-23 MED ORDER — LIDOCAINE 2% (20 MG/ML) 5 ML SYRINGE
INTRAMUSCULAR | Status: DC | PRN
  Administered 2016-11-23: 50 mg via INTRAVENOUS

## 2016-11-23 MED ORDER — LACTATED RINGERS IV SOLN
INTRAVENOUS | Status: DC
  Administered 2016-11-23 (×2): via INTRAVENOUS

## 2016-11-23 MED ORDER — ONDANSETRON HCL 4 MG/2ML IJ SOLN: 4.0000 mg | Freq: Four times a day (QID) | INTRAMUSCULAR | Status: DC | PRN

## 2016-11-23 MED ORDER — HYDROMORPHONE HCL-NACL 0.5-0.9 MG/ML-% IV SOSY: 0.2000 mg | PREFILLED_SYRINGE | INTRAVENOUS | Status: DC | PRN

## 2016-11-23 MED ORDER — DEXAMETHASONE SODIUM PHOSPHATE 4 MG/ML IJ SOLN
INTRAMUSCULAR | Status: DC | PRN
  Administered 2016-11-23: 10 mg via INTRAVENOUS

## 2016-11-23 MED ORDER — ROCURONIUM BROMIDE 50 MG/5ML IV SOSY
PREFILLED_SYRINGE | INTRAVENOUS | Status: DC | PRN
  Administered 2016-11-23: 50 mg via INTRAVENOUS
  Administered 2016-11-23: 20 mg via INTRAVENOUS

## 2016-11-23 MED ORDER — PROPOFOL 10 MG/ML IV BOLUS
INTRAVENOUS | Status: AC
  Filled 2016-11-23: qty 20

## 2016-11-23 MED ORDER — SUGAMMADEX SODIUM 200 MG/2ML IV SOLN
INTRAVENOUS | Status: DC | PRN
  Administered 2016-11-23: 200 mg via INTRAVENOUS

## 2016-11-23 MED ORDER — MEPERIDINE HCL 50 MG/ML IJ SOLN: 6.2500 mg | INTRAMUSCULAR | Status: DC | PRN

## 2016-11-23 MED ORDER — SCOPOLAMINE 1 MG/3DAYS TD PT72
MEDICATED_PATCH | TRANSDERMAL | Status: AC
  Filled 2016-11-23: qty 1

## 2016-11-23 MED ORDER — HYDROMORPHONE HCL-NACL 0.5-0.9 MG/ML-% IV SOSY
PREFILLED_SYRINGE | INTRAVENOUS | Status: AC
  Filled 2016-11-23: qty 4

## 2016-11-23 MED ORDER — ENOXAPARIN SODIUM 40 MG/0.4ML ~~LOC~~ SOLN
40.0000 mg | SUBCUTANEOUS | Status: AC
  Administered 2016-11-23: 40 mg via SUBCUTANEOUS
  Filled 2016-11-23: qty 0.4

## 2016-11-23 MED ORDER — PHENYLEPHRINE 40 MCG/ML (10ML) SYRINGE FOR IV PUSH (FOR BLOOD PRESSURE SUPPORT)
PREFILLED_SYRINGE | INTRAVENOUS | Status: AC
  Filled 2016-11-23: qty 10

## 2016-11-23 MED ORDER — STERILE WATER FOR IRRIGATION IR SOLN
Status: DC | PRN
  Administered 2016-11-23: 1000 mL

## 2016-11-23 MED ORDER — LEVOTHYROXINE SODIUM 25 MCG PO TABS
137.0000 ug | ORAL_TABLET | Freq: Every day | ORAL | Status: DC
  Administered 2016-11-24: 137 ug via ORAL
  Filled 2016-11-23: qty 1

## 2016-11-23 SURGICAL SUPPLY — 48 items
AGENT HMST KT MTR STRL THRMB (HEMOSTASIS)
APL ESCP 34 STRL LF DISP (HEMOSTASIS)
APPLICATOR SURGIFLO ENDO (HEMOSTASIS) IMPLANT
BAG LAPAROSCOPIC 12 15 PORT 16 (BASKET) IMPLANT
BAG RETRIEVAL 12/15 (BASKET)
BAG SPEC RTRVL LRG 6X4 10 (ENDOMECHANICALS)
CHLORAPREP W/TINT 26ML (MISCELLANEOUS) ×2 IMPLANT
COVER BACK TABLE 60X90IN (DRAPES) ×2 IMPLANT
COVER TIP SHEARS 8 DVNC (MISCELLANEOUS) ×1 IMPLANT
COVER TIP SHEARS 8MM DA VINCI (MISCELLANEOUS) ×1
DRAPE ARM DVNC X/XI (DISPOSABLE) ×4 IMPLANT
DRAPE COLUMN DVNC XI (DISPOSABLE) ×1 IMPLANT
DRAPE DA VINCI XI ARM (DISPOSABLE) ×4
DRAPE DA VINCI XI COLUMN (DISPOSABLE) ×1
DRAPE SHEET LG 3/4 BI-LAMINATE (DRAPES) ×4 IMPLANT
DRAPE SURG IRRIG POUCH 19X23 (DRAPES) ×2 IMPLANT
ELECT REM PT RETURN 15FT ADLT (MISCELLANEOUS) ×2 IMPLANT
GLOVE BIO SURGEON STRL SZ 6 (GLOVE) ×8 IMPLANT
GLOVE BIO SURGEON STRL SZ 6.5 (GLOVE) ×4 IMPLANT
GOWN STRL REUS W/ TWL LRG LVL3 (GOWN DISPOSABLE) ×3 IMPLANT
GOWN STRL REUS W/TWL LRG LVL3 (GOWN DISPOSABLE) ×6
HOLDER FOLEY CATH W/STRAP (MISCELLANEOUS) ×2 IMPLANT
IRRIG SUCT STRYKERFLOW 2 WTIP (MISCELLANEOUS) ×2
IRRIGATION SUCT STRKRFLW 2 WTP (MISCELLANEOUS) ×1 IMPLANT
MANIPULATOR UTERINE 4.5 ZUMI (MISCELLANEOUS) ×2 IMPLANT
NDL SAFETY ECLIPSE 18X1.5 (NEEDLE) IMPLANT
NDL SPNL 18GX3.5 QUINCKE PK (NEEDLE) IMPLANT
NEEDLE HYPO 18GX1.5 SHARP (NEEDLE) ×2
NEEDLE SPNL 18GX3.5 QUINCKE PK (NEEDLE) ×2 IMPLANT
OBTURATOR OPTICAL STANDARD 8MM (TROCAR) ×1
OBTURATOR OPTICAL STND 8 DVNC (TROCAR) ×1
OBTURATOR OPTICALSTD 8 DVNC (TROCAR) ×1 IMPLANT
PACK ROBOT GYN CUSTOM WL (TRAY / TRAY PROCEDURE) ×2 IMPLANT
PAD POSITIONING PINK XL (MISCELLANEOUS) ×2 IMPLANT
POUCH SPECIMEN RETRIEVAL 10MM (ENDOMECHANICALS) IMPLANT
SEAL CANN UNIV 5-8 DVNC XI (MISCELLANEOUS) ×4 IMPLANT
SEAL XI 5MM-8MM UNIVERSAL (MISCELLANEOUS) ×4
SET TRI-LUMEN FLTR TB AIRSEAL (TUBING) ×2 IMPLANT
SOLUTION ELECTROLUBE (MISCELLANEOUS) ×2 IMPLANT
SURGIFLO W/THROMBIN 8M KIT (HEMOSTASIS) IMPLANT
SUT PROLENE 5 0 CC 1 (SUTURE) IMPLANT
SUT VIC AB 0 CT1 27 (SUTURE) ×2
SUT VIC AB 0 CT1 27XBRD ANTBC (SUTURE) IMPLANT
TOWEL OR NON WOVEN STRL DISP B (DISPOSABLE) ×2 IMPLANT
TRAP SPECIMEN MUCOUS 40CC (MISCELLANEOUS) IMPLANT
TRAY FOLEY W/METER SILVER 16FR (SET/KITS/TRAYS/PACK) ×2 IMPLANT
UNDERPAD 30X30 (UNDERPADS AND DIAPERS) ×2 IMPLANT
WATER STERILE IRR 1000ML POUR (IV SOLUTION) ×2 IMPLANT

## 2016-11-23 NOTE — Op Note (Signed)
OPERATIVE NOTE 11/23/16  Surgeon: [name]   Assistants: Dr Lahoma Crocker (an MD assistant was necessary for tissue manipulation, management of robotic instrumentation, retraction and positioning due to the complexity of the case and hospital policies).   Anesthesia: General endotracheal anesthesia  ASA Class: 3   Pre-operative Diagnosis: grade 2 endometrial cancer   Post-operative Diagnosis: same  Operation: Robotic-assisted laparoscopic total hysterectomy with left salpingoophorectomy,  Sentinel lymph node biopsy.  Surgeon: [name]  Assistant Surgeon: Lahoma Crocker MD  Anesthesia: GET  Urine Output: 200cc  Operative Findings:  : 6cm normal appearing uterus. 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 No suspcious nodes. Adhesions between left ovary and left ovarian fossa.  Estimated Blood Loss:  Minimal      Total IV Fluids: 600 ml         Specimens: bilateral external iliac SLN's. uterus, cervix, left ovary.         Complications:  None; patient tolerated the procedure well.         Disposition: PACU - hemodynamically stable.  Procedure Details  The patient was seen in the Holding Room. The risks, benefits, complications, treatment options, and expected outcomes were discussed with the patient.  The patient concurred with the proposed plan, giving informed consent.  The site of surgery properly noted/marked. The patient was identified as [name] and the procedure verified as a Robotic-assisted hysterectomy with bilateral salpingo oophorectomy with SLN biopsy. A Time Out was held and the above information confirmed.  After induction of anesthesia, the patient was draped and prepped in the usual sterile manner. Pt was placed in supine position after anesthesia and draped and prepped in the usual sterile manner. The abdominal drape was placed after the CholoraPrep had been allowed to dry for 3 minutes.  Her arms were tucked  to her side with all appropriate precautions.  The shoulders were stabilized with padded shoulder blocks applied to the acromium processes.  The patient was placed in the semi-lithotomy position in [place].  The perineum was prepped with Betadine. The patient was then prepped. Foley catheter was placed.  A sterile speculum was placed in the vagina.  The cervix was grasped with a single-tooth tenaculum and dilated with Kennon Rounds dilators. 1mg  total of ICG was injected into the cervical stroma at 2 and 9 o'clock at a 28mm depth (concentration 0..5mg /ml).  The ZUMI uterine manipulator with a medium colpotomizer ring was placed without difficulty.  A pneum occluder balloon was placed over the manipulator.  OG tube placement was confirmed and to suction.   Next, a 5 mm skin incision was made 1 cm below the subcostal margin in the midclavicular line.  The 5 mm Optiview port and scope was used for direct entry.  Opening pressure was under 10 mm CO2.  The abdomen was insufflated and the findings were noted as above.   At this point and all points during the procedure, the patient's intra-abdominal pressure did not exceed 15 mmHg. Next, a 10 mm skin incision was made in the umbilicus and a right and left port was placed about 10 cm lateral to the robot port on the right and left side.  A fourth arm was placed in the left lower quadrant 2 cm above and superior and medial to the anterior superior iliac spine.  All ports were placed under direct visualization.  The patient was placed in steep Trendelenburg.  Bowel was folded away into the upper abdomen.  The robot  was docked in the normal manner.  The right and left peritoneum were opened parallel to the IP ligament to open the retroperitoneal spaces bilaterally. The SLN mapping was performed in bilateral pelvic basins. The para rectal and paravesical spaces were opened up. Lymphatic channels were identified travelling to the following visualized sentinel lymph node's:  bilateral external iliac SLN's. These SLN's were separated from their surrounding lymphatic tissue, removed and sent for permanent pathology.  The hysterectomy was started after the round ligament on the right side was incised and the retroperitoneum was entered and the pararectal space was developed.  The ureter was noted to be on the medial leaf of the broad ligament.  The peritoneum above the ureter was incised and stretched and the infundibulopelvic ligament was skeletonized, cauterized and cut.  The posterior peritoneum was taken down to the level of the KOH ring.  The anterior peritoneum was also taken down.  The bladder flap was created to the level of the KOH ring.  The uterine artery on the right side was skeletonized, cauterized and cut in the normal manner.  A similar procedure was performed on the left.  The colpotomy was made and the uterus, cervix, bilateral ovaries and tubes were amputated and delivered through the vagina.  Pedicles were inspected and excellent hemostasis was achieved.    The colpotomy at the vaginal cuff was closed with Vicryl on a CT1 needle in a running manner.  Irrigation was used and excellent hemostasis was achieved.  At this point in the procedure was completed.  Robotic instruments were removed under direct visulaization.  The robot was undocked. The 10 mm ports were closed with Vicryl on a UR-5 needle and the fascia was closed with 0 Vicryl on a UR-5 needle.  The skin was closed with 4-0 Vicryl in a subcuticular manner.  Dermabond was applied.  Sponge, lap and needle counts correct x 2.  The patient was taken to the recovery room in stable condition.  The vagina was swabbed with  minimal bleeding noted.   All instrument and needle counts were correct x  3.   The patient was transferred to the recovery room in stable condition.  [name], MD

## 2016-11-23 NOTE — Anesthesia Postprocedure Evaluation (Addendum)
Anesthesia Post Note  Patient: [name]  Procedure(s) Performed: Procedure(s) (LRB): XI ROBOTIC ASSISTED TOTAL HYSTERECTOMY (Bilateral) XI ROBOTIC ASSISTED LEFT OOPHORECTOMY (Bilateral) SENTINEL NODE BIOPSY (Bilateral)     Patient location during evaluation: PACU Anesthesia Type: General Level of consciousness: patient cooperative, oriented and sedated Pain management: pain level controlled Vital Signs Assessment: post-procedure vital signs reviewed and stable Respiratory status: spontaneous breathing, nonlabored ventilation, respiratory function stable and patient connected to nasal cannula oxygen Cardiovascular status: blood pressure returned to baseline and stable Postop Assessment: no signs of nausea or vomiting Anesthetic complications: no    Last Vitals:  Vitals:   11/23/16 1445 11/23/16 1455  BP: 132/74 138/73  Pulse: (!) 57 62  Resp: 11 15  Temp:    SpO2: 100% 100%    Last Pain:  Vitals:   11/23/16 1455  TempSrc:   PainSc: Asleep                 Verena Shawgo,E. Lehman Whiteley

## 2016-11-23 NOTE — H&P (View-Only) (Signed)
Consult Note: Gyn-Onc   [name] 65 y.o. female  Chief Complaint  Patient presents with  . Endometrial Cancer    Assessment :Endometrioid adenocarcinoma the endometrium (grade 2)  Plan:The natural history and treatment options for endometrial cancer discussed with patient and her husband. I would recommend the patient undergo a robotically assisted laparoscopic hysterectomy, bilateral salpingo-oophorectomy, and sentinel node biopsy. The procedure as well as risks and benefits were discussed. Patient's aware of the risks of hemorrhage, infection, injury to adjacent viscera, anesthetic risks, and thromboembolic complications. She is also aware that adjuvant radiation therapy and/or chemotherapy may be recommended postoperatively. All her questions are answered. Surgery scheduled with Dr. Denman George on September 4,/2018 at the patient's request..   HPI: 66 year old white married female seen in consultation at the request of [name] regarding management of a newly diagnosed endometrial carcinoma. The patient had some postmenopausal bleeding in December 2017 which by February had abated. Then returned more recently with heavier bleeding. Patient was found to have cervical stenosis and therefore underwent a formal D&C 10/15/2016. She was found to have a large endometrial polyp the grade 2 endometrial carcinoma. The patient's had an uncomplicated postoperative course. Patient has some past gynecologic history including a left oophorectomy in her teens for an ovarian cyst. 2006 she had bilateral salpingectomy for what I presume is hydrosalpinx. The patient is not on any hormone replacement therapy.    Review of Systems:10 point review of systems is negative except as noted in interval history.   Vitals: Blood pressure (!) 115/59, pulse 74, temperature 98.5 F (36.9 C), resp. rate 20, height 5\' 9"  (1.753 m), weight 152 lb (68.9 kg), SpO2 99 %.  Physical Exam: General : The patient is a healthy woman  in no acute distress.  HEENT: normocephalic, extraoccular movements normal; neck is supple without thyromegally  Lynphnodes: Supraclavicular and inguinal nodes not enlarged  Abdomen: Soft, non-tender, no ascites, no organomegally, no masses, no hernias  Pelvic:  EGBUS: Normal female  Vagina: Normal, no lesions  Urethra and Bladder: Normal, non-tender  Cervix: Normal, no lesions are noted Uterus: Anterior, normal shape size consistency  Bi-manual examination: Non-tender; no adenxal masses or nodularity  Rectal: normal sphincter tone, no masses, no blood  Lower extremities: No edema or varicosities. Normal range of motion      No Known Allergies  Past Medical History:  Diagnosis Date  . Cancer ([place]) 1998   thyroid ca  . Degenerative arthritis of toe joint, right    great toe  . Endometrial mass 10/15/2016  . Hypothyroidism   . Neuropathy    feet  . Postmenopausal bleeding 10/15/2016    Past Surgical History:  Procedure Laterality Date  . ABDOMINOPLASTY    . APPENDECTOMY    . BUNIONECTOMY WITH CHILECTOMY Right 03/03/2016   Procedure: Lillard Anes WITH CHILECTOMY;  Surgeon: [name], MD;  Location: [facility];  Service: Orthopedics;  Laterality: Right;  . CHEILECTOMY Right 03/03/2016   Procedure: CHEILECTOMY RIGHT GREAT TOE;  Surgeon: [name], MD;  Location: [facility];  Service: Orthopedics;  Laterality: Right;  . COLONOSCOPY    . COSMETIC SURGERY    . DILATATION & CURETTAGE/HYSTEROSCOPY WITH MYOSURE N/A 10/15/2016   Procedure: DILATATION & CURETTAGE/HYSTEROSCOPY WITH MYOSURE;  Surgeon: [name], MD;  Location: [facility];  Service: Gynecology;  Laterality: N/A;  Requests Microdilators and Lacrimal Duct Dilators  . OVARIAN CYST REMOVAL  1976  . SHOULDER SURGERY Right 03/2015   by dr [name] at  [facility]  . THYROIDECTOMY      Current Outpatient Prescriptions  Medication Sig Dispense Refill  . ibuprofen (ADVIL,MOTRIN) 200 MG tablet Take 600 mg  by mouth 2 (two) times daily as needed for moderate pain.    Marland Kitchen levothyroxine (SYNTHROID, LEVOTHROID) 150 MCG tablet Take 150 mcg by mouth daily before breakfast.   0  . OVER THE COUNTER MEDICATION Take 5 tablets by mouth 3 (three) times a week. Pyridoxal 1, 2, 3, and 4 otc supplement    . OVER THE COUNTER MEDICATION Carbon 90 bx otc supplement take 1 capsule daily 5 days out of the week    . OVER THE COUNTER MEDICATION Take 1 tablet by mouth as directed. Sulfur 24 g7 otc supplement take 1 tablet daily 5 days out of the week     No current facility-administered medications for this visit.     Social History   Social History  . Marital status: Married    Spouse name: N/A  . Number of children: N/A  . Years of education: N/A   Occupational History  . Not on file.   Social History Main Topics  . Smoking status: Never Smoker  . Smokeless tobacco: Never Used  . Alcohol use Yes     Comment: social  . Drug use: No  . Sexual activity: Not on file   Other Topics Concern  . Not on file   Social History Narrative  . No narrative on file    History reviewed. No pertinent family history.    Marti Sleigh, MD 10/25/2016, 11:39 AM

## 2016-11-23 NOTE — Transfer of Care (Signed)
Immediate Anesthesia Transfer of Care Note  Patient: [name]  Procedure(s) Performed: Procedure(s): XI ROBOTIC ASSISTED TOTAL HYSTERECTOMY (Bilateral) XI ROBOTIC ASSISTED LEFT OOPHORECTOMY (Bilateral) SENTINEL NODE BIOPSY (Bilateral)  Patient Location: PACU  Anesthesia Type:General  Level of Consciousness: Patient easily awoken, sedated, comfortable, cooperative, following commands, responds to stimulation.   Airway & Oxygen Therapy: Patient spontaneously breathing, ventilating well, oxygen via simple oxygen mask.  Post-op Assessment: Report given to PACU RN, vital signs reviewed and stable, moving all extremities.   Post vital signs: Reviewed and stable.  Complications: No apparent anesthesia complications  Last Vitals:  Vitals:   11/23/16 0900  BP: 136/80  Pulse: 68  Resp: 16  Temp: 36.7 C  SpO2: 98%    Last Pain:  Vitals:   11/23/16 0900  TempSrc: Oral      Patients Stated Pain Goal: 4 (76/81/15 7262)  Complications: No apparent anesthesia complications

## 2016-11-23 NOTE — Anesthesia Preprocedure Evaluation (Addendum)
Anesthesia Evaluation  Patient identified by MRN, date of birth, ID band Patient awake    Reviewed: Allergy & Precautions, NPO status , Patient's Chart, lab work & pertinent test results  History of Anesthesia Complications Negative for: history of anesthetic complications  Airway Mallampati: II  TM Distance: >3 FB Neck ROM: Full    Dental  (+) Dental Advisory Given   Pulmonary neg pulmonary ROS,    breath sounds clear to auscultation       Cardiovascular negative cardio ROS   Rhythm:Regular Rate:Normal     Neuro/Psych negative neurological ROS     GI/Hepatic negative GI ROS, Neg liver ROS,   Endo/Other  Hypothyroidism   Renal/GU negative Renal ROS     Musculoskeletal  (+) Arthritis , Osteoarthritis,    Abdominal   Peds  Hematology negative hematology ROS (+)   Anesthesia Other Findings   Reproductive/Obstetrics Endometrial mass/cancer                            Anesthesia Physical Anesthesia Plan  ASA: II  Anesthesia Plan: General   Post-op Pain Management:    Induction: Intravenous  PONV Risk Score and Plan: 4 or greater and Ondansetron, Dexamethasone, Midazolam and Scopolamine patch - Pre-op  Airway Management Planned: Oral ETT  Additional Equipment:   Intra-op Plan:   Post-operative Plan: Extubation in OR  Informed Consent: I have reviewed the patients History and Physical, chart, labs and discussed the procedure including the risks, benefits and alternatives for the proposed anesthesia with the patient or authorized representative who has indicated his/her understanding and acceptance.   Dental advisory given  Plan Discussed with: CRNA and Surgeon  Anesthesia Plan Comments: (Plan routine monitors, GETA )        Anesthesia Quick Evaluation

## 2016-11-23 NOTE — Anesthesia Procedure Notes (Signed)
Procedure Name: MAC Performed by: Deliah Boston Pre-anesthesia Checklist: Patient identified, Emergency Drugs available, Suction available, Patient being monitored and Timeout performed Patient Re-evaluated:Patient Re-evaluated prior to induction Oxygen Delivery Method: Circle system utilized Preoxygenation: Pre-oxygenation with 100% oxygen Induction Type: IV induction Ventilation: Mask ventilation without difficulty Laryngoscope Size: Mac and 3 Grade View: Grade III Tube type: Oral Number of attempts: 2 Airway Equipment and Method: Bougie stylet Placement Confirmation: ETT inserted through vocal cords under direct vision,  positive ETCO2,  CO2 detector and breath sounds checked- equal and bilateral Secured at: 21 cm Tube secured with: Tape Dental Injury: Teeth and Oropharynx as per pre-operative assessment

## 2016-11-23 NOTE — Progress Notes (Signed)
Received patient from PACU, VS obtained, skin assessed, oriented to unit, call light placed in reach

## 2016-11-23 NOTE — Interval H&P Note (Signed)
History and Physical Interval Note:  11/23/2016 11:10 AM  [name]  has presented today for surgery, with the diagnosis of ENDOMETRIAL CANCER  The various methods of treatment have been discussed with the patient and family. After consideration of risks, benefits and other options for treatment, the patient has consented to  Procedure(s): XI ROBOTIC ASSISTED TOTAL HYSTERECTOMY (Bilateral) XI ROBOTIC ASSISTED BILATERAL SALPINGO OOPHORECTOMY (Bilateral) SENTINEL NODE BIOPSY (Bilateral) as a surgical intervention .  The patient's history has been reviewed, patient examined, no change in status, stable for surgery.  I have reviewed the patient's chart and labs.  Questions were answered to the patient's satisfaction.     [name]

## 2016-11-24 ENCOUNTER — Telehealth: Payer: Self-pay | Admitting: *Deleted

## 2016-11-24 DIAGNOSIS — C541 Malignant neoplasm of endometrium: Secondary | ICD-10-CM | POA: Diagnosis not present

## 2016-11-24 LAB — BASIC METABOLIC PANEL
Anion gap: 6 (ref 5–15)
BUN: 17 mg/dL (ref 6–20)
CO2: 24 mmol/L (ref 22–32)
Calcium: 8.7 mg/dL — ABNORMAL LOW (ref 8.9–10.3)
Chloride: 110 mmol/L (ref 101–111)
Creatinine, Ser: 0.64 mg/dL (ref 0.44–1.00)
GFR calc Af Amer: >60 mL/min (ref >60)
GFR calc non Af Amer: >60 mL/min (ref >60)
Glucose, Bld: 145 mg/dL — ABNORMAL HIGH (ref 65–99)
Potassium: 4.3 mmol/L (ref 3.5–5.1)
Sodium: 140 mmol/L (ref 135–145)

## 2016-11-24 LAB — CBC
HCT: 33.2 % — ABNORMAL LOW (ref 36.0–46.0)
Hemoglobin: 11.2 g/dL — ABNORMAL LOW (ref 12.0–15.0)
MCH: 32.4 pg (ref 26.0–34.0)
MCHC: 33.7 g/dL (ref 30.0–36.0)
MCV: 96 fL (ref 78.0–100.0)
Platelets: 183 10*3/uL (ref 150–400)
RBC: 3.46 MIL/uL — ABNORMAL LOW (ref 3.87–5.11)
RDW: 13 % (ref 11.5–15.5)
WBC: 12 10*3/uL — ABNORMAL HIGH (ref 4.0–10.5)

## 2016-11-24 MED ORDER — OXYCODONE-ACETAMINOPHEN 5-325 MG PO TABS: 1.0000 | ORAL_TABLET | ORAL | 0 refills | Status: DC | PRN

## 2016-11-24 NOTE — Discharge Summary (Signed)
Physician Discharge Summary  Patient ID: [name] MRN: [id number] DOB/AGE: [birth year] 65 y.o.  Admit date: 11/23/2016 Discharge date: 11/24/2016  Admission Diagnoses: Endometrial cancer [facility])  Discharge Diagnoses:  Principal Problem:   Endometrial cancer [facility])   Discharged Condition:  The patient is in good condition and stable for discharge.    Hospital Course: On 11/23/2016, the patient underwent the following: Procedure(s): XI ROBOTIC ASSISTED TOTAL HYSTERECTOMY XI ROBOTIC ASSISTED LEFT OOPHORECTOMY SENTINEL NODE BIOPSY.  The postoperative course was uneventful.  She was discharged to home on postoperative day 1 tolerating a regular diet, voiding, passing flatus, ambulating without difficulty, pain controlled.  Consults: None  Significant Diagnostic Studies: None  Treatments: surgery: see above  Discharge Exam: Blood pressure (!) 109/51, pulse 97, temperature 98.4 F (36.9 C), temperature source Oral, resp. rate 16, height 5\' 9"  (1.753 m), weight 154 lb (69.9 kg), SpO2 99 %. General appearance: alert, cooperative and no distress Resp: clear to auscultation bilaterally Cardio: regular rate and rhythm, S1, S2 normal, no murmur, click, rub or gallop GI: soft, non-tender; bowel sounds normal; no masses,  no organomegaly Extremities: extremities normal, atraumatic, no cyanosis or edema Incision/Wound: Lap sites to the abdomen with dermabond without erythema or drainage  Disposition: 01-Home or Self Care  Discharge Instructions    Call MD for:  difficulty breathing, headache or visual disturbances    Complete by:  As directed    Call MD for:  extreme fatigue    Complete by:  As directed    Call MD for:  hives    Complete by:  As directed    Call MD for:  persistant dizziness or light-headedness    Complete by:  As directed    Call MD for:  persistant nausea and vomiting    Complete by:  As directed    Call MD for:  redness, tenderness, or signs of infection (pain, swelling,  redness, odor or green/yellow discharge around incision site)    Complete by:  As directed    Call MD for:  severe uncontrolled pain    Complete by:  As directed    Call MD for:  temperature >100.4    Complete by:  As directed    Diet - low sodium heart healthy    Complete by:  As directed    Driving Restrictions    Complete by:  As directed    No driving for 1 week.  Do not take narcotics and drive.   Increase activity slowly    Complete by:  As directed    Lifting restrictions    Complete by:  As directed    No lifting greater than 10 lbs.   Sexual Activity Restrictions    Complete by:  As directed    No sexual activity, nothing in the vagina, for 8 weeks.     Allergies as of 11/24/2016   No Known Allergies     Medication List    TAKE these medications   ibuprofen 200 MG tablet Commonly known as:  ADVIL,MOTRIN Take 600 mg by mouth 2 (two) times daily as needed for moderate pain.   levothyroxine 150 MCG tablet Commonly known as:  SYNTHROID, LEVOTHROID Take 150 mcg by mouth daily before breakfast.   levothyroxine 137 MCG tablet Commonly known as:  SYNTHROID, LEVOTHROID TAKE ONE TABLET DAILY IN THE MORNING 1/2 HOUR PRIOR TO FOOD   OVER THE COUNTER MEDICATION Take 5 tablets by mouth 3 (three) times a week. Pyridoxal 1, 2, 3, and  4 otc supplement  Vitamin package   OVER THE COUNTER MEDICATION Take 1 tablet by mouth as directed. Sulfur 24 g7 otc supplement take 1 tablet daily 5 days out of the week  Adrenaline gland support   OVER THE COUNTER MEDICATION Take 1 capsule by mouth See admin instructions. Carbon 90 bx otc supplement take 1 capsule daily 5 days out of the week  Omega 3 supplement   oxyCODONE-acetaminophen 5-325 MG tablet Commonly known as:  PERCOCET/ROXICET Take 1-2 tablets by mouth every 4 (four) hours as needed (moderate to severe pain).            Discharge Care Instructions        Start     Ordered   11/24/16 0000  oxyCODONE-acetaminophen  (PERCOCET/ROXICET) 5-325 MG tablet  Every 4 hours PRN     11/24/16 0951   11/24/16 0000  Increase activity slowly     11/24/16 0951   11/24/16 0000  Driving Restrictions    Comments:  No driving for 1 week.  Do not take narcotics and drive.   11/24/16 0951   11/24/16 0000  Lifting restrictions    Comments:  No lifting greater than 10 lbs.   11/24/16 0951   11/24/16 0000  Sexual Activity Restrictions    Comments:  No sexual activity, nothing in the vagina, for 8 weeks.   11/24/16 0951   11/24/16 0000  Diet - low sodium heart healthy     11/24/16 0951   11/24/16 0000  Call MD for:  temperature >100.4     11/24/16 0951   11/24/16 0000  Call MD for:  persistant nausea and vomiting     11/24/16 0951   11/24/16 0000  Call MD for:  severe uncontrolled pain     11/24/16 0951   11/24/16 0000  Call MD for:  redness, tenderness, or signs of infection (pain, swelling, redness, odor or green/yellow discharge around incision site)     11/24/16 0951   11/24/16 0000  Call MD for:  difficulty breathing, headache or visual disturbances     11/24/16 0951   11/24/16 0000  Call MD for:  hives     11/24/16 0951   11/24/16 0000  Call MD for:  persistant dizziness or light-headedness     11/24/16 0951   11/24/16 0000  Call MD for:  extreme fatigue     11/24/16 0951       Greater than thirty minutes were spend for face to face discharge instructions and discharge orders/summary in EPIC.   Signed: Ipek Westra DEAL 11/24/2016, 10:16 AM

## 2016-11-24 NOTE — Progress Notes (Signed)
Discharge and medication instructions reviewed with patient and spouse. Questions answered and both deny further questions. One prescription given to patient. Spouse is driving patient home. Donne Hazel, RN

## 2016-11-24 NOTE — Telephone Encounter (Signed)
Attempted to contact the patient with the post op appt. Left a message on the machine with the date/time of October 1st at 12:15pm

## 2016-11-24 NOTE — Discharge Instructions (Signed)
11/24/2016  Return to work: 4-6 weeks if applicable  Activity: 1. Be up and out of the bed during the day.  Take a nap if needed.  You may walk up steps but be careful and use the hand rail.  Stair climbing will tire you more than you think, you may need to stop part way and rest.   2. No lifting or straining for 6 weeks.  3. No driving for 1 week(s).  Do not drive if you are taking narcotic pain medicine.  4. Shower daily.  Use soap and water on your incision and pat dry; don't rub.  No tub baths until cleared by your surgeon.   5. No sexual activity and nothing in the vagina for 8 weeks.  6. You may experience a small amount of clear drainage from your incisions, which is normal.  If the drainage persists or increases, please call the office.  7. You may experience vaginal spotting after surgery or around the 6-8 week mark from surgery when the stitches at the top of the vagina begin to dissolve.  The spotting is normal but if you experience heavy bleeding, call our office.  Diet: 1. Low sodium Heart Healthy Diet is recommended.  2. It is safe to use a laxative, such as Miralax or Colace, if you have difficulty moving your bowels.   Wound Care: 1. Keep clean and dry.  Shower daily.  Reasons to call the Doctor:  Fever - Oral temperature greater than 100.4 degrees Fahrenheit  Foul-smelling vaginal discharge  Difficulty urinating  Nausea and vomiting  Increased pain at the site of the incision that is unrelieved with pain medicine.  Difficulty breathing with or without chest pain  New calf pain especially if only on one side  Sudden, continuing increased vaginal bleeding with or without clots.   Contacts: For questions or concerns you should contact:  Dr. [name] at [phone]  Joylene John, NP at [phone]  After Hours: call [phone] and have the GYN Oncologist paged/contacted  Acetaminophen; Oxycodone tablets What is this medicine? ACETAMINOPHEN;  OXYCODONE (a set a MEE noe fen; ox i KOE done) is a pain reliever. It is used to treat moderate to severe pain. This medicine may be used for other purposes; ask your health care provider or pharmacist if you have questions. COMMON BRAND NAME(S): Endocet, Magnacet, Narvox, Percocet, Perloxx, Primalev, Primlev, Roxicet, Xolox What should I tell my health care provider before I take this medicine? They need to know if you have any of these conditions: -brain tumor -Crohn's disease, inflammatory bowel disease, or ulcerative colitis -drug abuse or addiction -head injury -heart or circulation problems -if you often drink alcohol -kidney disease or problems going to the bathroom -liver disease -lung disease, asthma, or breathing problems -an unusual or allergic reaction to acetaminophen, oxycodone, other opioid analgesics, other medicines, foods, dyes, or preservatives -pregnant or trying to get pregnant -breast-feeding How should I use this medicine? Take this medicine by mouth with a full glass of water. Follow the directions on the prescription label. You can take it with or without food. If it upsets your stomach, take it with food. Take your medicine at regular intervals. Do not take it more often than directed. A special MedGuide will be given to you by the pharmacist with each prescription and refill. Be sure to read this information carefully each time. Talk to your pediatrician regarding the use of this medicine in children. Special care may be needed. Overdosage: If you  think you have taken too much of this medicine contact a poison control center or emergency room at once. NOTE: This medicine is only for you. Do not share this medicine with others. What if I miss a dose? If you miss a dose, take it as soon as you can. If it is almost time for your next dose, take only that dose. Do not take double or extra doses. What may interact with this medicine? This medicine may interact with the  following medications: -alcohol -antihistamines for allergy, cough and cold -antiviral medicines for HIV or AIDS -atropine -certain antibiotics like clarithromycin, erythromycin, linezolid, rifampin -certain medicines for anxiety or sleep -certain medicines for bladder problems like oxybutynin, tolterodine -certain medicines for depression like amitriptyline, fluoxetine, sertraline -certain medicines for fungal infections like ketoconazole, itraconazole, voriconazole -certain medicines for migraine headache like almotriptan, eletriptan, frovatriptan, naratriptan, rizatriptan, sumatriptan, zolmitriptan -certain medicines for nausea or vomiting like dolasetron, ondansetron, palonosetron -certain medicines for Parkinson's disease like benztropine, trihexyphenidyl -certain medicines for seizures like phenobarbital, phenytoin, primidone -certain medicines for stomach problems like dicyclomine, hyoscyamine -certain medicines for travel sickness like scopolamine -diuretics -general anesthetics like halothane, isoflurane, methoxyflurane, propofol -ipratropium -local anesthetics like lidocaine, pramoxine, tetracaine -MAOIs like Carbex, Eldepryl, Marplan, Nardil, and Parnate -medicines that relax muscles for surgery -methylene blue -nilotinib -other medicines with acetaminophen -other narcotic medicines for pain or cough -phenothiazines like chlorpromazine, mesoridazine, prochlorperazine, thioridazine This list may not describe all possible interactions. Give your health care provider a list of all the medicines, herbs, non-prescription drugs, or dietary supplements you use. Also tell them if you smoke, drink alcohol, or use illegal drugs. Some items may interact with your medicine. What should I watch for while using this medicine? Tell your doctor or health care professional if your pain does not go away, if it gets worse, or if you have new or a different type of pain. You may develop tolerance  to the medicine. Tolerance means that you will need a higher dose of the medication for pain relief. Tolerance is normal and is expected if you take this medicine for a long time. Do not suddenly stop taking your medicine because you may develop a severe reaction. Your body becomes used to the medicine. This does NOT mean you are addicted. Addiction is a behavior related to getting and using a drug for a non-medical reason. If you have pain, you have a medical reason to take pain medicine. Your doctor will tell you how much medicine to take. If your doctor wants you to stop the medicine, the dose will be slowly lowered over time to avoid any side effects. There are different types of narcotic medicines (opiates). If you take more than one type at the same time or if you are taking another medicine that also causes drowsiness, you may have more side effects. Give your health care provider a list of all medicines you use. Your doctor will tell you how much medicine to take. Do not take more medicine than directed. Call emergency for help if you have problems breathing or unusual sleepiness. Do not take other medicines that contain acetaminophen with this medicine. Always read labels carefully. If you have questions, ask your doctor or pharmacist. If you take too much acetaminophen get medical help right away. Too much acetaminophen can be very dangerous and cause liver damage. Even if you do not have symptoms, it is important to get help right away. You may get drowsy or dizzy. Do not drive, use machinery,  or do anything that needs mental alertness until you know how this medicine affects you. Do not stand or sit up quickly, especially if you are an older patient. This reduces the risk of dizzy or fainting spells. Alcohol may interfere with the effect of this medicine. Avoid alcoholic drinks. The medicine will cause constipation. Try to have a bowel movement at least every 2 to 3 days. If you do not have a bowel  movement for 3 days, call your doctor or health care professional. Your mouth may get dry. Chewing sugarless gum or sucking hard candy, and drinking plenty or water may help. Contact your doctor if the problem does not go away or is severe. What side effects may I notice from receiving this medicine? Side effects that you should report to your doctor or health care professional as soon as possible: -allergic reactions like skin rash, itching or hives, swelling of the face, lips, or tongue -breathing problems -confusion -redness, blistering, peeling or loosening of the skin, including inside the mouth -signs and symptoms of liver injury like dark yellow or brown urine; general ill feeling or flu-like symptoms; light-colored stools; loss of appetite; nausea; right upper belly pain; unusually weak or tired; yellowing of the eyes or skin -signs and symptoms of low blood pressure like dizziness; feeling faint or lightheaded, falls; unusually weak or tired -trouble passing urine or change in the amount of urine Side effects that usually do not require medical attention (report to your doctor or health care professional if they continue or are bothersome): -constipation -dry mouth -nausea, vomiting -tiredness This list may not describe all possible side effects. Call your doctor for medical advice about side effects. You may report side effects to FDA at 1-800-FDA-1088. Where should I keep my medicine? Keep out of the reach of children. This medicine can be abused. Keep your medicine in a safe place to protect it from theft. Do not share this medicine with anyone. Selling or giving away this medicine is dangerous and against the law. This medicine may cause accidental overdose and death if it taken by other adults, children, or pets. Mix any unused medicine with a substance like cat litter or coffee grounds. Then throw the medicine away in a sealed container like a sealed bag or a coffee can with a lid.  Do not use the medicine after the expiration date. Store at room temperature between 20 and 25 degrees C (68 and 77 degrees F). NOTE: This sheet is a summary. It may not cover all possible information. If you have questions about this medicine, talk to your doctor, pharmacist, or health care provider.  2018 Elsevier/Gold Standard (2014-12-02 21:48:12)

## 2016-11-30 ENCOUNTER — Telehealth: Payer: Self-pay | Admitting: Gynecologic Oncology

## 2016-11-30 ENCOUNTER — Encounter (HOSPITAL_COMMUNITY): Payer: Self-pay | Admitting: Gynecologic Oncology

## 2016-11-30 DIAGNOSIS — C541 Malignant neoplasm of endometrium: Secondary | ICD-10-CM

## 2016-11-30 NOTE — Telephone Encounter (Signed)
Unable to speak with patient - left message to call back.   patient has findings of metastatic cancer in her lymph nodes. Recommend adjuvant chemotherapy and vaginal brachytherapy. Also recommend PET/CT to evaluate for distant sites of disease.

## 2016-12-02 ENCOUNTER — Encounter: Payer: Self-pay | Admitting: Radiation Oncology

## 2016-12-02 ENCOUNTER — Telehealth: Payer: Self-pay | Admitting: Gynecologic Oncology

## 2016-12-02 NOTE — Telephone Encounter (Signed)
Returned call to patient.  Left message with information about proceeding with her dental implant and what ointments to use on her rash-like area around her incision.  Advised her to please call the office for any questions or concerns.

## 2016-12-03 ENCOUNTER — Telehealth: Payer: Self-pay | Admitting: *Deleted

## 2016-12-03 ENCOUNTER — Telehealth: Payer: Self-pay | Admitting: Gynecologic Oncology

## 2016-12-03 NOTE — Telephone Encounter (Signed)
Patient called backed and left a message to call her back. Attempted to call the patietn back with no answer.

## 2016-12-03 NOTE — Telephone Encounter (Signed)
Patient informed of upcoming appt with Alvy Bimler.  All questions answered.  She states the rash around her incisions does not itch and is improving.  Advised to call for any needs.

## 2016-12-03 NOTE — Telephone Encounter (Signed)
Per staff message from Dr. Denman George, consult sppts made to see Dr.Kinard and Dr. Alvy Bimler. Attempted to contact the patient with no answer, LMOM.

## 2016-12-06 ENCOUNTER — Telehealth: Payer: Self-pay | Admitting: *Deleted

## 2016-12-06 NOTE — Telephone Encounter (Signed)
Patient called and was given the appt date/time for the PET scan on September 20th 12pm.

## 2016-12-08 ENCOUNTER — Encounter: Payer: Self-pay | Admitting: Radiation Oncology

## 2016-12-08 ENCOUNTER — Ambulatory Visit
Admission: RE | Admit: 2016-12-08 | Discharge: 2016-12-08 | Disposition: A | Discharge status: Home / Self Care | Payer: Medicare Other | Source: Ambulatory Visit | Attending: Radiation Oncology | Admitting: Radiation Oncology

## 2016-12-08 ENCOUNTER — Ambulatory Visit (HOSPITAL_BASED_OUTPATIENT_CLINIC_OR_DEPARTMENT_OTHER): Payer: Medicare Other | Admitting: Hematology and Oncology

## 2016-12-08 ENCOUNTER — Encounter: Payer: Self-pay | Admitting: Hematology and Oncology

## 2016-12-08 ENCOUNTER — Telehealth: Payer: Self-pay | Admitting: Hematology and Oncology

## 2016-12-08 VITALS — BP 134/62 | HR 67 | Temp 98.0°F | Resp 18 | Ht 69.0 in | Wt 157.8 lb

## 2016-12-08 DIAGNOSIS — Z9889 Other specified postprocedural states: Secondary | ICD-10-CM | POA: Diagnosis not present

## 2016-12-08 DIAGNOSIS — C541 Malignant neoplasm of endometrium: Secondary | ICD-10-CM

## 2016-12-08 DIAGNOSIS — Z8542 Personal history of malignant neoplasm of other parts of uterus: Secondary | ICD-10-CM | POA: Insufficient documentation

## 2016-12-08 DIAGNOSIS — Z510 Encounter for antineoplastic radiation therapy: Secondary | ICD-10-CM | POA: Insufficient documentation

## 2016-12-08 DIAGNOSIS — Z791 Long term (current) use of non-steroidal anti-inflammatories (NSAID): Secondary | ICD-10-CM | POA: Insufficient documentation

## 2016-12-08 DIAGNOSIS — Z9221 Personal history of antineoplastic chemotherapy: Secondary | ICD-10-CM | POA: Insufficient documentation

## 2016-12-08 DIAGNOSIS — Z90710 Acquired absence of both cervix and uterus: Secondary | ICD-10-CM | POA: Insufficient documentation

## 2016-12-08 DIAGNOSIS — Z85850 Personal history of malignant neoplasm of thyroid: Secondary | ICD-10-CM | POA: Insufficient documentation

## 2016-12-08 DIAGNOSIS — Z803 Family history of malignant neoplasm of breast: Secondary | ICD-10-CM | POA: Insufficient documentation

## 2016-12-08 DIAGNOSIS — Z79899 Other long term (current) drug therapy: Secondary | ICD-10-CM | POA: Diagnosis not present

## 2016-12-08 DIAGNOSIS — E039 Hypothyroidism, unspecified: Secondary | ICD-10-CM | POA: Diagnosis not present

## 2016-12-08 DIAGNOSIS — G629 Polyneuropathy, unspecified: Secondary | ICD-10-CM | POA: Diagnosis not present

## 2016-12-08 DIAGNOSIS — D259 Leiomyoma of uterus, unspecified: Secondary | ICD-10-CM | POA: Diagnosis not present

## 2016-12-08 DIAGNOSIS — N950 Postmenopausal bleeding: Secondary | ICD-10-CM | POA: Insufficient documentation

## 2016-12-08 DIAGNOSIS — Z923 Personal history of irradiation: Secondary | ICD-10-CM | POA: Insufficient documentation

## 2016-12-08 HISTORY — DX: Malignant neoplasm of endometrium: C54.1

## 2016-12-08 NOTE — Progress Notes (Signed)
GYN Location of Tumor / Histology: Endometrioid adenocarcinoma the endometrium   [name] presented with symptoms of: postmenopausal bleeding in December 2017 which by February had abated. Then returned more recently with heavier bleeding.  Biopsies revealed:   11/23/16 Diagnosis 1. Lymph node, sentinel, biopsy, right external iliac - NO CARCINOMA IDENTIFIED IN ONE LYMPH NODE (0/1) - ENDOSALPINGIOSIS 2. Lymph node, sentinel, biopsy, left external iliac - METASTATIC CARCINOMA INVOLVING ONE LYMPH NODE 1/1 - ENDOSALPINGIOSIS 3. Uterus +/- tubes/ovaries, neoplastic, and left ovary, cervix UTERUS: - ENDOMETRIOID ADENOCARCINOMA, FIGO GRADE 3 OF 3 - LEIOMYOMATA (1.6 CM; LARGEST) - ADENOMYOSIS CERVIX: - NO CARCINOMA IDENTIFIED LEFT OVARY: - NO CARCINOMA IDENTIFIED  10/15/16 Diagnosis Endometrium, resection - ENDOMETRIOID ADENOCARCINOMA. - SEE COMMENT.  Past/Anticipated interventions by Gyn/Onc surgery, if any: 11/23/16 - Procedure: XI ROBOTIC ASSISTED TOTAL HYSTERECTOMY and Procedure: XI ROBOTIC ASSISTED LEFT OOPHORECTOMY;  Surgeon: [name] MD  Past/Anticipated interventions by medical oncology, if any: Dr. Denman George is recommending adjuvant chemotherapy and vaginal brachytherapy.  She has an appointment with Dr. Clemon Chambers today.  Weight changes, if any: no  Bowel/Bladder complaints, if any: No.,   Nausea/Vomiting, if any: no  Pain issues, if any:  no  SAFETY ISSUES:  Prior radiation? no  Pacemaker/ICD? no  Possible current pregnancy? no  Is the patient on methotrexate? no  Current Complaints / other details:  PET scan scheduled for tomorrow.  Patient has a history of thyroid cancer and has a sister who had breast cancer. She is here with her husband.  BP 134/62 (BP Location: Right Arm, Patient Position: Sitting)   Pulse 67   Temp 98 F (36.7 C) (Oral)   Ht 5\' 9"  (1.753 m)   Wt 157 lb 12.8 oz (71.6 kg)   SpO2 100%   BMI 23.30 kg/m    Wt Readings from Last 3  Encounters:  12/08/16 157 lb 12.8 oz (71.6 kg)  11/23/16 154 lb (69.9 kg)  11/11/16 154 lb 8 oz (70.1 kg)

## 2016-12-08 NOTE — Progress Notes (Signed)
Please see the Nurse Progress Note in the MD Initial Consult Encounter for this patient. 

## 2016-12-08 NOTE — Progress Notes (Signed)
START ON PATHWAY REGIMEN - Uterine     A cycle is every 21 days:     Paclitaxel      Carboplatin   **Always confirm dose/schedule in your pharmacy ordering system**    Patient Characteristics: Endometrioid Histology, First Line - Newly Diagnosed, Medically Operable, Stage IIIC1/IIIC2 - Grade 1, 2, or 3 AJCC T Category: T1 AJCC N Category: N1 AJCC M Category: M0 AJCC 8 Stage Grouping: IIIC1 Line of therapy: First Line - Newly Diagnosed Would you be surprised if this patient died  in the next year<= I would be surprised if this patient died in the next year Patient Status: Medically Operable Intent of Therapy: Curative Intent, Discussed with Patient

## 2016-12-08 NOTE — Progress Notes (Addendum)
Radiation Oncology         [phone]) [phone] ________________________________  Initial outpatient Consultation  Name: [name] MRN: [id number]  Date: 12/08/2016  DOB: Aug 17, 1950  JS:EGBT, Benjamine Mola, NP  [name], MD   REFERRING PHYSICIAN: [name], MD  DIAGNOSIS: The encounter diagnosis was Endometrial cancer [facility]).   Endometrial adenocarcinoma, FIGO grade III, stage pT1b,snpN1a   HISTORY OF PRESENT ILLNESS::[name] is a 66 y.o. female who initially presented with symptoms of postmenopausal bleeding in December 2017 that resolved by February 2018. She noted that her postmenopausal bleeding started again recently and was heavier than normal. Pt underwent a pap smear and endometrium biopsy on 10/15/2016 with results revealing: Endometrioid adenocarcinoma. Microscopic Comment The carcinoma appears FIGO grade II. There are foci of adenocarcinoma involving smooth muscle. Pt was evaluated by Dr. Fermin Schwab on 10/25/2016 who recommended that the patient undergo total hysterectomy.  Pt had a total hysterectomy completed on 9/4/018 performed by Dr. Denman George. Pt also underwent sentinel node biopsy on 11/23/2016 with results revealing: 1. Lymph node, sentinel, biopsy, right external iliac -No carcinoma identified in one lymph node. Endosalpingiosis. 2. Lymph node, sentinel, biopsy, left external iliac. -Metastatic carcinoma involving one lymph node 1/1. Endosalpingiosis. 3. Uterus +/- tubes/ovaries, neoplastic, and left ovary, cervix. Uterus: endometrioid adenocarcinoma, FIGO grade 3 of 3. leiomyomata (1.6 cm; largest). Adenomyosis. Cervix and left ovary with no carcinoma identified. Pt is scheduled for a PET scan on tomorrow, 12/09/2016.   Pt presents to the office today accompanied by her husband. She reports that she is doing well overall with no complications following surgery. Pt notes that she has been exercising recently and is excited to began playing golf again. She states that she initially  presented with postmenopausal bleeding that was evaluated by her OB-GYN with an [place] and D&C prior to her total hysterectomy on 11/23/2016. Pt reports that she will have 6 treatments of chemo approximately 3 weeks apart and will follow up with a Medical Oncologist today following this visit. She states that she is aware that this time frame could change after her appointment. She states that her next appointment with Dr. Denman George is on 12/20/2016. Pt reports that she has a prior hx of thyroid cancer in 1998 that wasn't treated with radiation therapy, but she had a thyroidectomy completed. Pt notes that she is currently retired, but volunteers and remains with an active lifestyle.   On review of systems, pt denies fever, weight loss, decreased appetite, decreased energy levels. Denies pain. Denies leg swelling. Denies HA or vision changes.   PREVIOUS RADIATION THERAPY: No  PAST MEDICAL HISTORY:  has a past medical history of Cancer ([place]) (1998); Degenerative arthritis of toe joint, right; Endometrial cancer ([place]); Endometrial mass (10/15/2016); Hypothyroidism; Neuropathy; and Postmenopausal bleeding (10/15/2016).    PAST SURGICAL HISTORY: Past Surgical History:  Procedure Laterality Date  . ABDOMINAL HYSTERECTOMY    . ABDOMINOPLASTY    . APPENDECTOMY    . BUNIONECTOMY WITH CHILECTOMY Right 03/03/2016   Procedure: Lillard Anes WITH CHILECTOMY;  Surgeon: [name], MD;  Location: [facility];  Service: Orthopedics;  Laterality: Right;  . CHEILECTOMY Right 03/03/2016   Procedure: CHEILECTOMY RIGHT GREAT TOE;  Surgeon: [name], MD;  Location: [facility];  Service: Orthopedics;  Laterality: Right;  . COLONOSCOPY    . COSMETIC SURGERY    . DILATATION & CURETTAGE/HYSTEROSCOPY WITH MYOSURE N/A 10/15/2016   Procedure: DILATATION & CURETTAGE/HYSTEROSCOPY WITH MYOSURE;  Surgeon: [name], MD;  Location: [facility];  Service: Gynecology;  Laterality: N/A;  Requests Microdilators and  Lacrimal Duct Dilators  . OVARIAN CYST REMOVAL  1976  . ROBOTIC ASSISTED BILATERAL SALPINGO OOPHERECTOMY Bilateral 11/23/2016   Procedure: XI ROBOTIC ASSISTED LEFT OOPHORECTOMY;  Surgeon: [name], MD;  Location: [facility];  Service: Gynecology;  Laterality: Bilateral;  . ROBOTIC ASSISTED TOTAL HYSTERECTOMY Bilateral 11/23/2016   Procedure: XI ROBOTIC ASSISTED TOTAL HYSTERECTOMY;  Surgeon: [name], MD;  Location: [facility];  Service: Gynecology;  Laterality: Bilateral;  . SENTINEL NODE BIOPSY Bilateral 11/23/2016   Procedure: SENTINEL NODE BIOPSY;  Surgeon: [name], MD;  Location: [facility];  Service: Gynecology;  Laterality: Bilateral;  . SHOULDER SURGERY Left 03/2015   by dr [name] at [facility]  . THYROIDECTOMY  04/1996    FAMILY HISTORY: family history includes Breast cancer in her sister.  SOCIAL HISTORY:  reports that she has never smoked. She has never used smokeless tobacco. She reports that she drinks alcohol. She reports that she does not use drugs.  ALLERGIES: Patient has no known allergies.  MEDICATIONS:  Current Outpatient Prescriptions  Medication Sig Dispense Refill  . ibuprofen (ADVIL,MOTRIN) 200 MG tablet Take 600 mg by mouth 2 (two) times daily as needed for moderate pain.    Marland Kitchen levothyroxine (SYNTHROID, LEVOTHROID) 137 MCG tablet TAKE ONE TABLET DAILY IN THE MORNING 1/2 HOUR PRIOR TO FOOD  5  . OVER THE COUNTER MEDICATION Take 5 tablets by mouth 3 (three) times a week. Pyridoxal 1, 2, 3, and 4 otc supplement  Vitamin package    . OVER THE COUNTER MEDICATION Take 1 tablet by mouth as directed. Sulfur 24 g7 otc supplement take 1 tablet daily 5 days out of the week  Adrenaline gland support    . OVER THE COUNTER MEDICATION Take 1 capsule by mouth See admin instructions. Carbon 90 bx otc supplement take 1 capsule daily 5 days out of the week  Omega 3 supplement     No current facility-administered medications for this encounter.     REVIEW OF SYSTEMS:  REVIEW OF SYSTEMS: A 10+  POINT REVIEW OF SYSTEMS WAS OBTAINED including neurology, dermatology, psychiatry, cardiac, respiratory, lymph, extremities, GI, GU, musculoskeletal, constitutional, reproductive, HEENT. All pertinent positives are noted in the HPI. All others are negative.   PHYSICAL EXAM:  height is 5\' 9"  (1.753 m) and weight is 157 lb 12.8 oz (71.6 kg). Her oral temperature is 98 F (36.7 C). Her blood pressure is 134/62 and her pulse is 67. Her oxygen saturation is 100%.   General: Alert and oriented, in no acute distress HEENT: Head is normocephalic. Extraocular movements are intact. Oropharynx is clear. Neck: Neck is supple, no palpable cervical or supraclavicular lymphadenopathy. Heart: Regular in rate and rhythm with no murmurs, rubs, or gallops. Chest: Clear to auscultation bilaterally, with no rhonchi, wheezes, or rales. Abdomen: Soft, nontender, nondistended, with no rigidity or guarding. Extremities: No cyanosis or edema. Lymphatics: see Neck Exam Skin: No concerning lesions. Musculoskeletal: symmetric strength and muscle tone throughout. Neurologic: Cranial nerves II through XII are grossly intact. No obvious focalities. Speech is fluent. Coordination is intact. Psychiatric: Judgment and insight are intact. Affect is appropriate. Pelvic exam deferred until simulation and planning day  ECOG = 1   LABORATORY DATA:  Lab Results  Component Value Date   WBC 12.0 (H) 11/24/2016   HGB 11.2 (L) 11/24/2016   HCT 33.2 (L) 11/24/2016   MCV 96.0 11/24/2016   PLT 183 11/24/2016   Lab Results  Component Value Date  NA 140 11/24/2016   K 4.3 11/24/2016   CL 110 11/24/2016   CO2 24 11/24/2016   GLUCOSE 145 (H) 11/24/2016   CREATININE 0.64 11/24/2016   CALCIUM 8.7 (L) 11/24/2016      RADIOGRAPHY: No results found.    IMPRESSION: 66 y.o. woman with Endometrial adenocarcinoma, FIGO grade III, stage pT1b, snpN1a . Pt found to have metastatic spread to the left external iliac chain on snp.  Therefore will proceed with PET scan tomorrow to rule out additional areas of mestatsis. Pending the results of PET scan, she will be a good candidate for vaginal brachytherapy as part of her overall management. Patients tumor did show deep myometrial invasion. We discussed course of treatment, side effect, and potential toxicities with the patient and her husband and she is receptive to proceeding with radiation therapy.     PLAN: Assuming no evidence of distant metastatic disease on PET scan, she will receive 5 intracavitary brachytherapy treatments directed at vaginal cuff region.       ------------------------------------------------  Blair Promise, PhD, MD     This document serves as a record of services personally performed by Gery Pray, MD. It was created on her behalf by [facility], a trained medical scribe. The creation of this record is based on the scribe's personal observations and the provider's statements to them. This document has been checked and approved by the attending provider.

## 2016-12-08 NOTE — Progress Notes (Signed)
[place] NOTE  Patient Care Team: [name], NP as PCP - General (Obstetrics and Gynecology)  CHIEF COMPLAINTS/PURPOSE OF CONSULTATION:  Stage III endometrial cancer, for adjuvant treatment  HISTORY OF PRESENTING ILLNESS:  [name] 66 y.o. female is here because of recent diagnosis of endometrial cancer She is here with her husband, Wille Glaser The patient initially presented with postmenopausal bleeding I review her records extensively and summarized as follows:   Endometrial cancer ([place])   10/15/2016 Pathology Results    Endometrium, resection - ENDOMETRIOID ADENOCARCINOMA. - SEE COMMENT. Microscopic Comment The carcinoma appears FIGO grade II. There are foci of adenocarcinoma involving smooth muscle.       10/15/2016 Surgery    Preoperative diagnosis: Postmenopausal bleeding, endometrial mass/ thick endometrial stripe.                                       Cervical stenosis    Postop diagnosis: Same.   Procedure: Hysteroscopic resection of endometrial mass with Myosure Lite and dilatation and curettage Anesthesia General  Surgeon: [name], MD  IV fluids: LR 1 liter Estimated blood loss : 100 cc  Saline Fluid deficit: 1060 cc  Urine output: straight catheter preop 25 cc  Complications none  Condition stable  Disposition PACU  Specimen: Endometrial mass resected tissue, endometrial curetting, endometrial fluid      11/23/2016 Pathology Results    1. Lymph node, sentinel, biopsy, right external iliac - NO CARCINOMA IDENTIFIED IN ONE LYMPH NODE (0/1) - ENDOSALPINGIOSIS 2. Lymph node, sentinel, biopsy, left external iliac - METASTATIC CARCINOMA INVOLVING ONE LYMPH NODE 1/1 - ENDOSALPINGIOSIS 3. Uterus +/- tubes/ovaries, neoplastic, and left ovary, cervix UTERUS:    - ENDOMETRIOID ADENOCARCINOMA, FIGO GRADE 3 OF 3 - LEIOMYOMATA (1.6 CM; LARGEST) - ADENOMYOSIS CERVIX: - NO CARCINOMA IDENTIFIED LEFT OVARY: - NO CARCINOMA  IDENTIFIED Microscopic Comment 3. ONCOLOGY TABLE-UTERUS, CARCINOMA OR CARCINOSARCOMA Specimen: Uterus, cervix, left ovary, and sentinel lymph nodes Procedure: Total hysterectomy, left oophorectomy and sentinel lymph node biopsies Maximum tumor size: 6 cm Histologic type: Endometrioid carcinoma, not otherwise specified Grade: FIGO 3 Myometrial invasion: 1.3 cm where myometrium is 1.9 cm in thickness Cervical stromal involvement: Not identified Extent of involvement of other organs: Not identified Lymph - vascular invasion: Not identified Peritoneal washings: Not submitted/found Lymph nodes: Examined: 2 Sentinel 0 Non-sentinel 2 Total Lymph nodes with metastasis: 1 Isolated tumor cells (< 0.2 mm): 0 Micrometastasis: (> 0.2 mm and < 2.0 mm): 0 Macrometastasis: (> 2.0 mm): 1 Extracapsular extension: Not identified TNM code: pT1b, snpN1a FIGO Stage (based on pathologic findings, needs clinical correlation): IIIC1      11/23/2016 Surgery    Operation: Robotic-assisted laparoscopic total hysterectomy with left salpingoophorectomy,  Sentinel lymph node biopsy.  Surgeon: [name]  Assistant Surgeon: Lahoma Crocker MD Operative Findings:  : 6cm normal appearing uterus. 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 No suspcious nodes. Adhesions between left ovary and left ovarian fossa.       She feels well now after surgery She has minimum pain Her wound is healing well She had recent bloating, improving She denies changes in bowel habits Patient also had history of papillary cancer status post thyroidectomy She is on chronic suppressive therapy  MEDICAL HISTORY:  Past Medical History:  Diagnosis Date  . Cancer ([place]) 1998   thyroid ca  . Degenerative arthritis of toe joint,  right    great toe  . Endometrial cancer ([place])   . Endometrial mass 10/15/2016  . Hypothyroidism   . Neuropathy    feet  . Postmenopausal bleeding 10/15/2016     SURGICAL HISTORY: Past Surgical History:  Procedure Laterality Date  . ABDOMINAL HYSTERECTOMY    . ABDOMINOPLASTY    . APPENDECTOMY    . BUNIONECTOMY WITH CHILECTOMY Right 03/03/2016   Procedure: Lillard Anes WITH CHILECTOMY;  Surgeon: [name], MD;  Location: [facility];  Service: Orthopedics;  Laterality: Right;  . CHEILECTOMY Right 03/03/2016   Procedure: CHEILECTOMY RIGHT GREAT TOE;  Surgeon: [name], MD;  Location: [facility];  Service: Orthopedics;  Laterality: Right;  . COLONOSCOPY    . COSMETIC SURGERY    . DILATATION & CURETTAGE/HYSTEROSCOPY WITH MYOSURE N/A 10/15/2016   Procedure: DILATATION & CURETTAGE/HYSTEROSCOPY WITH MYOSURE;  Surgeon: [name], MD;  Location: [facility];  Service: Gynecology;  Laterality: N/A;  Requests Microdilators and Lacrimal Duct Dilators  . OVARIAN CYST REMOVAL  1976  . ROBOTIC ASSISTED BILATERAL SALPINGO OOPHERECTOMY Bilateral 11/23/2016   Procedure: XI ROBOTIC ASSISTED LEFT OOPHORECTOMY;  Surgeon: [name], MD;  Location: [facility];  Service: Gynecology;  Laterality: Bilateral;  . ROBOTIC ASSISTED TOTAL HYSTERECTOMY Bilateral 11/23/2016   Procedure: XI ROBOTIC ASSISTED TOTAL HYSTERECTOMY;  Surgeon: [name], MD;  Location: [facility];  Service: Gynecology;  Laterality: Bilateral;  . SENTINEL NODE BIOPSY Bilateral 11/23/2016   Procedure: SENTINEL NODE BIOPSY;  Surgeon: [name], MD;  Location: [facility];  Service: Gynecology;  Laterality: Bilateral;  . SHOULDER SURGERY Left 03/2015   by dr [name] at [facility]  . THYROIDECTOMY  04/1996    SOCIAL HISTORY: Social History   Social History  . Marital status: Married    Spouse name: Joe  . Number of children: 0  . Years of education: N/A   Occupational History  . retired    Social History Main Topics  . Smoking status: Never Smoker  . Smokeless tobacco: Never Used  . Alcohol use Yes     Comment: social  . Drug use: No  . Sexual activity: Not on file   Other  Topics Concern  . Not on file   Social History Narrative  . No narrative on file    FAMILY HISTORY: Family History  Problem Relation Age of Onset  . Breast cancer Sister     ALLERGIES:  has No Known Allergies.  MEDICATIONS:  Current Outpatient Prescriptions  Medication Sig Dispense Refill  . ibuprofen (ADVIL,MOTRIN) 200 MG tablet Take 600 mg by mouth 2 (two) times daily as needed for moderate pain.    Marland Kitchen levothyroxine (SYNTHROID, LEVOTHROID) 137 MCG tablet TAKE ONE TABLET DAILY IN THE MORNING 1/2 HOUR PRIOR TO FOOD  5  . OVER THE COUNTER MEDICATION Take 5 tablets by mouth 3 (three) times a week. Pyridoxal 1, 2, 3, and 4 otc supplement  Vitamin package    . OVER THE COUNTER MEDICATION Take 1 tablet by mouth as directed. Sulfur 24 g7 otc supplement take 1 tablet daily 5 days out of the week  Adrenaline gland support    . OVER THE COUNTER MEDICATION Take 1 capsule by mouth See admin instructions. Carbon 90 bx otc supplement take 1 capsule daily 5 days out of the week  Omega 3 supplement     No current facility-administered medications for this visit.     REVIEW OF SYSTEMS:   Constitutional: Denies fevers, chills or abnormal night sweats  Eyes: Denies blurriness of vision, double vision or watery eyes Ears, nose, mouth, throat, and face: Denies mucositis or sore throat Respiratory: Denies cough, dyspnea or wheezes Cardiovascular: Denies palpitation, chest discomfort or lower extremity swelling Gastrointestinal:  Denies nausea, heartburn or change in bowel habits Skin: Denies abnormal skin rashes Lymphatics: Denies new lymphadenopathy or easy bruising Neurological:Denies numbness, tingling or new weaknesses Behavioral/Psych: Mood is stable, no new changes  All other systems were reviewed with the patient and are negative.  PHYSICAL EXAMINATION: ECOG PERFORMANCE STATUS: 0 - Asymptomatic  Vitals:   12/08/16 1431  BP: 134/62  Pulse: 67  Resp: 18  Temp: 98 F (36.7 C)  SpO2:  100%   Filed Weights   12/08/16 1431  Weight: 157 lb 12.8 oz (71.6 kg)    GENERAL:alert, no distress and comfortable SKIN: skin color, texture, turgor are normal, no rashes or significant lesions EYES: normal, conjunctiva are pink and non-injected, sclera clear OROPHARYNX:no exudate, no erythema and lips, buccal mucosa, and tongue normal  NECK: Well-healed surgical scar LYMPH:  no palpable lymphadenopathy in the cervical, axillary or inguinal LUNGS: clear to auscultation and percussion with normal breathing effort HEART: regular rate & rhythm and no murmurs and no lower extremity edema ABDOMEN:abdomen soft, non-tender and normal bowel sounds.  Well-healed surgical scar Musculoskeletal:no cyanosis of digits and no clubbing  PSYCH: alert & oriented x 3 with fluent speech NEURO: no focal motor/sensory deficits  LABORATORY DATA:  I have reviewed the data as listed Lab Results  Component Value Date   WBC 12.0 (H) 11/24/2016   HGB 11.2 (L) 11/24/2016   HCT 33.2 (L) 11/24/2016   MCV 96.0 11/24/2016   PLT 183 11/24/2016    Recent Labs  10/04/16 1130 11/11/16 0959 11/24/16 0429  NA 140 140 140  K 4.2 4.8 4.3  CL 107 109 110  CO2 27 26 24   GLUCOSE 85 91 145*  BUN 22* 24* 17  CREATININE 0.64 0.65 0.64  CALCIUM 9.1 9.1 8.7*  GFRNONAA >60 >60 >60  GFRAA >60 >60 >60  PROT  --  7.2  --   ALBUMIN  --  4.2  --   AST  --  20  --   ALT  --  17  --   ALKPHOS  --  64  --   BILITOT  --  0.5  --     ASSESSMENT & PLAN:  Endometrial cancer ([place]) PET CT scan is pending We will call patient with test results We reviewed the NCCN guidelines We discussed the role of chemotherapy. The intent is of curative intent.  We discussed some of the risks, benefits, side-effects of carboplatin & Taxol. Treatment is intravenous, every 3 weeks x 6 cycles  Some of the short term side-effects included, though not limited to, including weight loss, life threatening infections, risk of allergic  reactions, need for transfusions of blood products, nausea, vomiting, change in bowel habits, loss of hair, admission to hospital for various reasons, and risks of death.   Long term side-effects are also discussed including risks of infertility, permanent damage to nerve function, hearing loss, chronic fatigue, kidney damage with possibility needing hemodialysis, and rare secondary malignancy including bone marrow disorders.  The patient is aware that the response rates discussed earlier is not guaranteed.  After a long discussion, patient made an informed decision to proceed with the prescribed plan of care.   Patient education material was dispensed. We discussed premedication with dexamethasone before chemotherapy. I recommend port placement  Given her age, I recommend G-CSF support I will see her back in 2 weeks for chemotherapy consent Due to scheduling issue, we plan to start her on treatment on December 27, 2016   History of thyroid cancer She has personal history of thyroid cancer status post thyroidectomy in 1998 She never require adjuvant radioactive iodine treatment She will continue on chronic suppressive therapy with thyroid medicine  Orders Placed This Encounter  Procedures  . IR FLUORO GUIDE PORT INSERTION RIGHT    Standing Status:   Future    Standing Expiration Date:   02/07/2018    Order Specific Question:   Reason for Exam (SYMPTOM  OR DIAGNOSIS REQUIRED)    Answer:   need port    Order Specific Question:   Preferred Imaging Location?    Answer:   [facility]    All questions were answered. The patient knows to call the clinic with any problems, questions or concerns. I spent 55 minutes counseling the patient face to face. The total time spent in the appointment was 80 minutes and more than 50% was on counseling.     Heath Lark, MD 12/08/2016 3:42 PM

## 2016-12-08 NOTE — Assessment & Plan Note (Signed)
PET CT scan is pending We will call patient with test results We reviewed the NCCN guidelines We discussed the role of chemotherapy. The intent is of curative intent.  We discussed some of the risks, benefits, side-effects of carboplatin & Taxol. Treatment is intravenous, every 3 weeks x 6 cycles  Some of the short term side-effects included, though not limited to, including weight loss, life threatening infections, risk of allergic reactions, need for transfusions of blood products, nausea, vomiting, change in bowel habits, loss of hair, admission to hospital for various reasons, and risks of death.   Long term side-effects are also discussed including risks of infertility, permanent damage to nerve function, hearing loss, chronic fatigue, kidney damage with possibility needing hemodialysis, and rare secondary malignancy including bone marrow disorders.  The patient is aware that the response rates discussed earlier is not guaranteed.  After a long discussion, patient made an informed decision to proceed with the prescribed plan of care.   Patient education material was dispensed. We discussed premedication with dexamethasone before chemotherapy. I recommend port placement Given her age, I recommend G-CSF support I will see her back in 2 weeks for chemotherapy consent Due to scheduling issue, we plan to start her on treatment on December 27, 2016

## 2016-12-08 NOTE — Telephone Encounter (Signed)
Gave avs and calendar for October  °

## 2016-12-08 NOTE — Assessment & Plan Note (Signed)
She has personal history of thyroid cancer status post thyroidectomy in 1998 She never require adjuvant radioactive iodine treatment She will continue on chronic suppressive therapy with thyroid medicine

## 2016-12-09 ENCOUNTER — Telehealth: Payer: Self-pay | Admitting: *Deleted

## 2016-12-09 ENCOUNTER — Other Ambulatory Visit: Payer: Self-pay | Admitting: Hematology and Oncology

## 2016-12-09 ENCOUNTER — Ambulatory Visit (HOSPITAL_COMMUNITY)
Admission: RE | Admit: 2016-12-09 | Discharge: 2016-12-09 | Disposition: A | Discharge status: Home / Self Care | Payer: Medicare Other | Source: Ambulatory Visit | Attending: Gynecologic Oncology | Admitting: Gynecologic Oncology

## 2016-12-09 DIAGNOSIS — C541 Malignant neoplasm of endometrium: Secondary | ICD-10-CM | POA: Insufficient documentation

## 2016-12-09 DIAGNOSIS — Z90710 Acquired absence of both cervix and uterus: Secondary | ICD-10-CM | POA: Diagnosis not present

## 2016-12-09 DIAGNOSIS — K5730 Diverticulosis of large intestine without perforation or abscess without bleeding: Secondary | ICD-10-CM | POA: Diagnosis not present

## 2016-12-09 DIAGNOSIS — R590 Localized enlarged lymph nodes: Secondary | ICD-10-CM | POA: Insufficient documentation

## 2016-12-09 DIAGNOSIS — K8020 Calculus of gallbladder without cholecystitis without obstruction: Secondary | ICD-10-CM | POA: Insufficient documentation

## 2016-12-09 LAB — GLUCOSE, CAPILLARY: Glucose-Capillary: 89 mg/dL (ref 65–99)

## 2016-12-09 MED ORDER — FLUDEOXYGLUCOSE F - 18 (FDG) INJECTION
7.6000 | Freq: Once | INTRAVENOUS | Status: AC | PRN
  Administered 2016-12-09: 7.6 via INTRAVENOUS

## 2016-12-09 NOTE — Telephone Encounter (Signed)
I can move her port to be done sooner Would she consider 10/4 to start? If she would still prefer 10/11 that would still be fine I would need to place order to change

## 2016-12-09 NOTE — Telephone Encounter (Signed)
She would prefer to start on 12/30/16. Is having dental implants at the end of this month.

## 2016-12-09 NOTE — Telephone Encounter (Signed)
I will modify orders Please send scheduling msg Tell her Joylene John will call her with PET result

## 2016-12-09 NOTE — Telephone Encounter (Signed)
Pt left a message that she would rather do chemotherapy on Thursdays rather than Mondays. Would like to start on 10/11 if possible.

## 2016-12-10 ENCOUNTER — Telehealth: Payer: Self-pay | Admitting: Gynecologic Oncology

## 2016-12-10 NOTE — Telephone Encounter (Signed)
Attempted to call patient with PET scan results.  Left message.  Will retry on Monday.

## 2016-12-13 ENCOUNTER — Telehealth: Payer: Self-pay | Admitting: Gynecologic Oncology

## 2016-12-13 ENCOUNTER — Encounter: Payer: Self-pay | Admitting: Gynecologic Oncology

## 2016-12-13 ENCOUNTER — Telehealth: Payer: Self-pay | Admitting: Hematology and Oncology

## 2016-12-13 NOTE — Telephone Encounter (Signed)
Spoke with patient and rescheduled her appts for her.

## 2016-12-13 NOTE — Progress Notes (Signed)
Gynecologic Oncology Multi-Disciplinary Disposition Conference Note  Date of the Conference: December 13, 2016  Patient Name: [name]  Referring Provider: [name] Primary GYN Oncologist: Dr. [name]  Stage/Disposition:  Stage IIIC1 endometrioid adenocarcinoma.  Disposition is to 6 cycles of carboplatin and taxol with vaginal brachytherapy.   This Multidisciplinary conference took place involving physicians from [place], [facility], Radiation Oncology, Pathology, Radiology along with the Gynecologic Oncology Nurse Practitioner and RN.  Comprehensive assessment of the patient's malignancy, staging, need for surgery, chemotherapy, radiation therapy, and need for further testing were reviewed. Supportive measures, both inpatient and following discharge were also discussed. The recommended plan of care is documented. Greater than 35 minutes were spent correlating and coordinating this patient's care.

## 2016-12-13 NOTE — Telephone Encounter (Signed)
Patient's husband notified of PET scan results per pt request.  No concerns voiced.  Advised to call for any needs.

## 2016-12-14 ENCOUNTER — Telehealth: Payer: Self-pay

## 2016-12-14 NOTE — Telephone Encounter (Signed)
Talked with pt about concept of blood counts and levels being low about 7-14 days out when need more to be careful about being around sick people. Discussed 1st week after chemo is usually when people feel worst.

## 2016-12-14 NOTE — Telephone Encounter (Signed)
Pt has chemo class on oct 1st. She is wanting to get info about travel restrictions sooner to plan vacations.  LVM will try to call again.

## 2016-12-20 ENCOUNTER — Encounter: Payer: Self-pay | Admitting: Gynecologic Oncology

## 2016-12-20 ENCOUNTER — Ambulatory Visit (HOSPITAL_BASED_OUTPATIENT_CLINIC_OR_DEPARTMENT_OTHER): Payer: Medicare Other | Admitting: Hematology and Oncology

## 2016-12-20 ENCOUNTER — Other Ambulatory Visit: Payer: Medicare Other

## 2016-12-20 ENCOUNTER — Telehealth: Payer: Self-pay | Admitting: Hematology and Oncology

## 2016-12-20 ENCOUNTER — Ambulatory Visit: Payer: Medicare Other | Attending: Gynecologic Oncology | Admitting: Gynecologic Oncology

## 2016-12-20 ENCOUNTER — Telehealth: Payer: Self-pay

## 2016-12-20 VITALS — BP 115/54 | HR 67 | Temp 98.3°F | Resp 18 | Ht 69.0 in | Wt 155.0 lb

## 2016-12-20 VITALS — BP 115/54 | HR 67 | Temp 98.3°F | Resp 18 | Ht 69.0 in | Wt 155.1 lb

## 2016-12-20 DIAGNOSIS — Z85850 Personal history of malignant neoplasm of thyroid: Secondary | ICD-10-CM | POA: Diagnosis not present

## 2016-12-20 DIAGNOSIS — Z8542 Personal history of malignant neoplasm of other parts of uterus: Secondary | ICD-10-CM | POA: Diagnosis not present

## 2016-12-20 DIAGNOSIS — C541 Malignant neoplasm of endometrium: Secondary | ICD-10-CM | POA: Diagnosis present

## 2016-12-20 DIAGNOSIS — Z90710 Acquired absence of both cervix and uterus: Secondary | ICD-10-CM | POA: Diagnosis not present

## 2016-12-20 DIAGNOSIS — M4802 Spinal stenosis, cervical region: Secondary | ICD-10-CM | POA: Insufficient documentation

## 2016-12-20 DIAGNOSIS — E039 Hypothyroidism, unspecified: Secondary | ICD-10-CM | POA: Diagnosis not present

## 2016-12-20 DIAGNOSIS — Z90722 Acquired absence of ovaries, bilateral: Secondary | ICD-10-CM | POA: Diagnosis not present

## 2016-12-20 DIAGNOSIS — Z9079 Acquired absence of other genital organ(s): Secondary | ICD-10-CM | POA: Diagnosis not present

## 2016-12-20 DIAGNOSIS — Z23 Encounter for immunization: Secondary | ICD-10-CM | POA: Diagnosis not present

## 2016-12-20 DIAGNOSIS — Z803 Family history of malignant neoplasm of breast: Secondary | ICD-10-CM | POA: Diagnosis not present

## 2016-12-20 DIAGNOSIS — Z7189 Other specified counseling: Secondary | ICD-10-CM | POA: Diagnosis not present

## 2016-12-20 MED ORDER — INFLUENZA VAC SPLIT QUAD 0.5 ML IM SUSY
0.5000 mL | PREFILLED_SYRINGE | Freq: Once | INTRAMUSCULAR | Status: AC
  Administered 2016-12-20: 0.5 mL via INTRAMUSCULAR
  Filled 2016-12-20: qty 0.5

## 2016-12-20 MED ORDER — DEXAMETHASONE 4 MG PO TABS: ORAL_TABLET | ORAL | 1 refills | Status: DC

## 2016-12-20 MED ORDER — PROCHLORPERAZINE MALEATE 10 MG PO TABS
10.0000 mg | ORAL_TABLET | Freq: Four times a day (QID) | ORAL | 1 refills | Status: DC | PRN
Start: 2016-12-20 — End: 2017-05-20

## 2016-12-20 MED ORDER — LIDOCAINE-PRILOCAINE 2.5-2.5 % EX CREA: TOPICAL_CREAM | CUTANEOUS | 3 refills | Status: DC

## 2016-12-20 MED ORDER — ONDANSETRON HCL 8 MG PO TABS: 8.0000 mg | ORAL_TABLET | Freq: Three times a day (TID) | ORAL | 1 refills | Status: DC | PRN

## 2016-12-20 NOTE — Telephone Encounter (Signed)
Called Tiffany with below message.

## 2016-12-20 NOTE — Progress Notes (Signed)
Follow-up Note: Gyn-Onc   [name] 66 y.o. female  Chief Complaint  Patient presents with  . Endometrial cancer, FIGO stage IIIC ([facility])    Assessment :Endometrioid adenocarcinoma the endometrium (grade 3), stage IIIC 1  Plan:  In accordance with NCCN guidelines we are recommending systemic therapy with 6 cycles of carboplatin and paclitaxel with vaginal brachytherapy to assist with local control.  Needs MSI testing - patient without risk factors, and has sister with breast cancer.  HPI: 66 year old white married female seen in consultation at the request of [name] regarding management of a newly diagnosed endometrial carcinoma. The patient had some postmenopausal bleeding in December 2017 which by February had abated. Then returned more recently with heavier bleeding. Patient was found to have cervical stenosis and therefore underwent a formal D&C 10/15/2016. She was found to have a large endometrial polyp the grade 2 endometrial carcinoma. The patient's had an uncomplicated postoperative course. Patient has some past gynecologic history including a left oophorectomy in her teens for an ovarian cyst. 2006 she had bilateral salpingectomy for what I presume is hydrosalpinx. The patient is not on any hormone replacement therapy.  Interval Hx: On 11/23/16 she underwent robotic assisted total hysterectomy, BSO, SLN biopsy. Final pathology revealed a 6cm grade 3 endometrioid tumor with 1.3cm of 1.9cm myo invasion, no LVSI. Negative adnexae, but 1of 2 SLN's was positive for macrometastatic disease (from the left external iliac node).  In accordance with NCCN guidelines she was counseled to receive adjuvant chemotherapy and vaginal brachytherapy.  Review of Systems:10 point review of systems is negative except as noted in interval history.   Vitals: Blood pressure (!) 115/54, pulse 67, temperature 98.3 F (36.8 C), temperature source Oral, resp. rate 18, height _0  (1.753 m), weight 155 lb  1.6 oz (70.4 kg), SpO2 98 %.  Physical Exam: General : The patient is a healthy woman in no acute distress.  HEENT: normocephalic, extraoccular movements normal; neck is supple without thyromegally  Lynphnodes: Supraclavicular and inguinal nodes not enlarged  Abdomen: Soft, non-tender, no ascites, no organomegally, no masses, no hernias, incisions well healed Pelvic:  EGBUS: Normal female  Vagina: normally healing cuff. Bi-manual examination: Non-tender; no adenxal masses or nodularity  Rectal: normal sphincter tone, no masses, no blood  Lower extremities: No edema or varicosities. Normal range of motion      No Known Allergies  Past Medical History:  Diagnosis Date  . Cancer ([place]) 1998   thyroid ca  . Degenerative arthritis of toe joint, right    great toe  . Endometrial cancer ([place])   . Endometrial mass 10/15/2016  . Hypothyroidism   . Neuropathy    feet  . Postmenopausal bleeding 10/15/2016    Past Surgical History:  Procedure Laterality Date  . ABDOMINAL HYSTERECTOMY    . ABDOMINOPLASTY    . APPENDECTOMY    . BUNIONECTOMY WITH CHILECTOMY Right 03/03/2016   Procedure: Lillard Anes WITH CHILECTOMY;  Surgeon: [name], MD;  Location: [facility];  Service: Orthopedics;  Laterality: Right;  . CHEILECTOMY Right 03/03/2016   Procedure: CHEILECTOMY RIGHT GREAT TOE;  Surgeon: [name], MD;  Location: [facility];  Service: Orthopedics;  Laterality: Right;  . COLONOSCOPY    . COSMETIC SURGERY    . DILATATION & CURETTAGE/HYSTEROSCOPY WITH MYOSURE N/A 10/15/2016   Procedure: DILATATION & CURETTAGE/HYSTEROSCOPY WITH MYOSURE;  Surgeon: [name], MD;  Location: [facility];  Service: Gynecology;  Laterality: N/A;  Requests Microdilators and Lacrimal Duct Dilators  .  OVARIAN CYST REMOVAL  1976  . ROBOTIC ASSISTED BILATERAL SALPINGO OOPHERECTOMY Bilateral 11/23/2016   Procedure: XI ROBOTIC ASSISTED LEFT OOPHORECTOMY;  Surgeon: [name], MD;   Location[facility];  Service: Gynecology;  Laterality: Bilateral;  . ROBOTIC ASSISTED TOTAL HYSTERECTOMY Bilateral 11/23/2016   Procedure: XI ROBOTIC ASSISTED TOTAL HYSTERECTOMY;  Surgeon: [name], MD;  Location: [facility];  Service: Gynecology;  Laterality: Bilateral;  . SENTINEL NODE BIOPSY Bilateral 11/23/2016   Procedure: SENTINEL NODE BIOPSY;  Surgeon: [name], MD;  [facility];  Service: Gynecology;  Laterality: Bilateral;  . SHOULDER SURGERY Left 03/2015   by dr [name] at [facility]  04/1996    Current Outpatient Prescriptions  Medication Sig Dispense Refill  . ibuprofen (ADVIL,MOTRIN) 200 MG tablet Take 600 mg by mouth 2 (two) times daily as needed for moderate pain.    Marland Kitchen levothyroxine (SYNTHROID, LEVOTHROID) 137 MCG tablet TAKE ONE TABLET DAILY IN THE MORNING 1/2 HOUR PRIOR TO FOOD  5  . OVER THE COUNTER MEDICATION Take 5 tablets by mouth 3 (three) times a week. Pyridoxal 1, 2, 3, and 4 otc supplement  Vitamin package    . OVER THE COUNTER MEDICATION Take 1 tablet by mouth as directed. Sulfur 24 g7 otc supplement take 1 tablet daily 5 days out of the week  Adrenaline gland support    . OVER THE COUNTER MEDICATION Take 1 capsule by mouth See admin instructions. Carbon 90 bx otc supplement take 1 capsule daily 5 days out of the week  Omega 3 supplement     No current facility-administered medications for this visit.     Social History   Social History  . Marital status: Married    Spouse name: Joe  . Number of children: 0  . Years of education: N/A   Occupational History  . retired    Social History Main Topics  . Smoking status: Never Smoker  . Smokeless tobacco: Never Used  . Alcohol use Yes     Comment: social  . Drug use: No  . Sexual activity: Not on file   Other Topics Concern  . Not on file   Social History Narrative  . No narrative on file    Family History  Problem Relation Age of Onset  . Breast cancer Sister      30 minutes of  direct face to face counseling time was spent with the patient. This included discussion about prognosis, therapy recommendations and postoperative side effects and are beyond the scope of routine postoperative care.   [name], MD 12/20/2016, 12:44 PM

## 2016-12-20 NOTE — Telephone Encounter (Signed)
-----   Message from Heath Lark, MD sent at 12/20/2016  2:00 PM EDT ----- Regarding: IR to draw CBC and CMP before port placement on 10/4 pls remind IR

## 2016-12-20 NOTE — Telephone Encounter (Signed)
Gave avs and calendar for October and November  °

## 2016-12-20 NOTE — Patient Instructions (Signed)
You will continue with chemotherapy with Dr Alvy Bimler.  Dr Denman George will see you back for surveillance visits after completing chemotherapy and radiation.  Call [phone] for questions regarding surgery.

## 2016-12-21 ENCOUNTER — Encounter: Payer: Self-pay | Admitting: Hematology and Oncology

## 2016-12-21 ENCOUNTER — Other Ambulatory Visit: Payer: Self-pay | Admitting: Radiology

## 2016-12-21 NOTE — Progress Notes (Signed)
[name] OFF[facility] PROGRESS NOTE  Patient Care Team: [name], NP as PCP - General (Obstetrics and Gynecology)  SUMMARY OF ONCOLOGIC HISTORY: Oncology History   MSI stable     Endometrial cancer ([place])   10/15/2016 Pathology Results    Endometrium, resection - ENDOMETRIOID ADENOCARCINOMA. - SEE COMMENT. Microscopic Comment The carcinoma appears FIGO grade II. There are foci of adenocarcinoma involving smooth muscle.       10/15/2016 Surgery    Preoperative diagnosis: Postmenopausal bleeding, endometrial mass/ thick endometrial stripe.                                       Cervical stenosis    Postop diagnosis: Same.   Procedure: Hysteroscopic resection of endometrial mass with Myosure Lite and dilatation and curettage Anesthesia General  Surgeon: [name], MD  IV fluids: LR 1 liter Estimated blood loss : 100 cc  Saline Fluid deficit: 1060 cc  Urine output: straight catheter preop 25 cc  Complications none  Condition stable  Disposition PACU  Specimen: Endometrial mass resected tissue, endometrial curetting, endometrial fluid      11/23/2016 Pathology Results    1. Lymph node, sentinel, biopsy, right external iliac - NO CARCINOMA IDENTIFIED IN ONE LYMPH NODE (0/1) - ENDOSALPINGIOSIS 2. Lymph node, sentinel, biopsy, left external iliac - METASTATIC CARCINOMA INVOLVING ONE LYMPH NODE 1/1 - ENDOSALPINGIOSIS 3. Uterus +/- tubes/ovaries, neoplastic, and left ovary, cervix UTERUS:    - ENDOMETRIOID ADENOCARCINOMA, FIGO GRADE 3 OF 3 - LEIOMYOMATA (1.6 CM; LARGEST) - ADENOMYOSIS CERVIX: - NO CARCINOMA IDENTIFIED LEFT OVARY: - NO CARCINOMA IDENTIFIED Microscopic Comment 3. ONCOLOGY TABLE-UTERUS, CARCINOMA OR CARCINOSARCOMA Specimen: Uterus, cervix, left ovary, and sentinel lymph nodes Procedure: Total hysterectomy, left oophorectomy and sentinel lymph node biopsies Maximum tumor size: 6 cm Histologic type: Endometrioid carcinoma, not otherwise  specified Grade: FIGO 3 Myometrial invasion: 1.3 cm where myometrium is 1.9 cm in thickness Cervical stromal involvement: Not identified Extent of involvement of other organs: Not identified Lymph - vascular invasion: Not identified Peritoneal washings: Not submitted/found Lymph nodes: Examined: 2 Sentinel 0 Non-sentinel 2 Total Lymph nodes with metastasis: 1 Isolated tumor cells (< 0.2 mm): 0 Micrometastasis: (> 0.2 mm and < 2.0 mm): 0 Macrometastasis: (> 2.0 mm): 1 Extracapsular extension: Not identified TNM code: pT1b, [id number] FIGO Stage (based on pathologic findings, needs clinical correlation): IIIC1      11/23/2016 Surgery    Operation: Robotic-assisted laparoscopic total hysterectomy with left salpingoophorectomy,  Sentinel lymph node biopsy.  Surgeon: [name]  Assistant Surgeon: Lahoma Crocker MD Operative Findings:  : 6cm normal appearing uterus. 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 No suspcious nodes. Adhesions between left ovary and left ovarian fossa.       12/09/2016 PET scan    Recent hysterectomy with small amount of free fluid in pelvis. No evidence of the residual or metastatic carcinoma.  Single 9 mm left level 2A cervical lymph node with low-grade metabolic activity, which is nonspecific but likely reactive in etiology.  Incidentally noted sigmoid diverticulosis and cholelithiasis.       INTERVAL HISTORY: Please see below for problem oriented charting. She returns with her husband for chemotherapy consent We reviewed imaging study Since the last time I saw her, she had dental implant She is not symptomatic Her abdominal wound is healing well  REVIEW OF SYSTEMS:   Constitutional: Denies fevers, chills  or abnormal weight loss Eyes: Denies blurriness of vision Ears, nose, mouth, throat, and face: Denies mucositis or sore throat Respiratory: Denies cough, dyspnea or wheezes Cardiovascular: Denies  palpitation, chest discomfort or lower extremity swelling Gastrointestinal:  Denies nausea, heartburn or change in bowel habits Skin: Denies abnormal skin rashes Lymphatics: Denies new lymphadenopathy or easy bruising Neurological:Denies numbness, tingling or new weaknesses Behavioral/Psych: Mood is stable, no new changes  All other systems were reviewed with the patient and are negative.  I have reviewed the past medical history, past surgical history, social history and family history with the patient and they are unchanged from previous note.  ALLERGIES:  has No Known Allergies.  MEDICATIONS:  Current Outpatient Prescriptions  Medication Sig Dispense Refill  . dexamethasone (DECADRON) 4 MG tablet Take 5 tabs the day before chemo and 5 tabs the morning of chemo, every 3 weeks x 6 60 tablet 1  . ibuprofen (ADVIL,MOTRIN) 200 MG tablet Take 600 mg by mouth 2 (two) times daily as needed for moderate pain.    Marland Kitchen levothyroxine (SYNTHROID, LEVOTHROID) 137 MCG tablet TAKE ONE TABLET DAILY IN THE MORNING 1/2 HOUR PRIOR TO FOOD  5  . lidocaine-prilocaine (EMLA) cream Apply to affected area once 30 g 3  . ondansetron (ZOFRAN) 8 MG tablet Take 1 tablet (8 mg total) by mouth every 8 (eight) hours as needed for refractory nausea / vomiting. Start on day 3 after chemo. 30 tablet 1  . OVER THE COUNTER MEDICATION Take 5 tablets by mouth 3 (three) times a week. Pyridoxal 1, 2, 3, and 4 otc supplement  Vitamin package    . OVER THE COUNTER MEDICATION Take 1 tablet by mouth as directed. Sulfur 24 g7 otc supplement take 1 tablet daily 5 days out of the week  Adrenaline gland support    . OVER THE COUNTER MEDICATION Take 1 capsule by mouth See admin instructions. Carbon 90 bx otc supplement take 1 capsule daily 5 days out of the week  Omega 3 supplement    . prochlorperazine (COMPAZINE) 10 MG tablet Take 1 tablet (10 mg total) by mouth every 6 (six) hours as needed (Nausea or vomiting). 30 tablet 1   No  current facility-administered medications for this visit.     PHYSICAL EXAMINATION: ECOG PERFORMANCE STATUS: 0 - Asymptomatic  Vitals:   12/20/16 1319  BP: (!) 115/54  Pulse: 67  Resp: 18  Temp: 98.3 F (36.8 C)  SpO2: 98%   Filed Weights   12/20/16 1319  Weight: 155 lb (70.3 kg)    GENERAL:alert, no distress and comfortable SKIN: skin color, texture, turgor are normal, no rashes or significant lesions EYES: normal, Conjunctiva are pink and non-injected, sclera clear OROPHARYNX:no exudate, no erythema and lips, buccal mucosa, and tongue normal  NECK: supple, thyroid normal size, non-tender, without nodularity LYMPH:  no palpable lymphadenopathy in the cervical, axillary or inguinal LUNGS: clear to auscultation and percussion with normal breathing effort HEART: regular rate & rhythm and no murmurs and no lower extremity edema ABDOMEN:abdomen soft, non-tender and normal bowel sounds Musculoskeletal:no cyanosis of digits and no clubbing  NEURO: alert & oriented x 3 with fluent speech, no focal motor/sensory deficits  LABORATORY DATA:  I have reviewed the data as listed    Component Value Date/Time   NA 140 11/24/2016 0429   K 4.3 11/24/2016 0429   CL 110 11/24/2016 0429   CO2 24 11/24/2016 0429   GLUCOSE 145 (H) 11/24/2016 0429   BUN 17 11/24/2016 0429  CREATININE 0.64 11/24/2016 0429   CALCIUM 8.7 (L) 11/24/2016 0429   PROT 7.2 11/11/2016 0959   ALBUMIN 4.2 11/11/2016 0959   AST 20 11/11/2016 0959   ALT 17 11/11/2016 0959   ALKPHOS 64 11/11/2016 0959   BILITOT 0.5 11/11/2016 0959   GFRNONAA >60 11/24/2016 0429   GFRAA >60 11/24/2016 0429    No results found for: SPEP, UPEP  Lab Results  Component Value Date   WBC 12.0 (H) 11/24/2016   HGB 11.2 (L) 11/24/2016   HCT 33.2 (L) 11/24/2016   MCV 96.0 11/24/2016   PLT 183 11/24/2016      Chemistry      Component Value Date/Time   NA 140 11/24/2016 0429   K 4.3 11/24/2016 0429   CL 110 11/24/2016 0429    CO2 24 11/24/2016 0429   BUN 17 11/24/2016 0429   CREATININE 0.64 11/24/2016 0429      Component Value Date/Time   CALCIUM 8.7 (L) 11/24/2016 0429   ALKPHOS 64 11/11/2016 0959   AST 20 11/11/2016 0959   ALT 17 11/11/2016 0959   BILITOT 0.5 11/11/2016 0959       RADIOGRAPHIC STUDIES: I have reviewed imaging study with the patient and her husband I have personally reviewed the radiological images as listed and agreed with the findings in the report. Nm Pet Image Initial (pi) Skull Base To Thigh  Result Date: 12/10/2016 CLINICAL DATA:  Initial treatment strategy for endometrial carcinoma with lymph node metastases. status post TAH-BSO 2 weeks ago. EXAM: NUCLEAR MEDICINE PET SKULL BASE TO THIGH TECHNIQUE: 7.6 mCi F-18 FDG was injected intravenously. Full-ring PET imaging was performed from the skull base to thigh after the radiotracer. CT data was obtained and used for attenuation correction and anatomic localization. FASTING BLOOD GLUCOSE:  Value: 89 mg/dl COMPARISON:  None. FINDINGS: NECK: 9 mm left level 2A lymph node shows mild FDG uptake with SUV max of 3.2. No other hypermetabolic cervical lymph nodes or masses identified. CHEST: No hypermetabolic masses or lymphadenopathy. No suspicious pulmonary nodules seen on CT images. ABDOMEN/PELVIS: No abnormal hypermetabolic activity within the liver, pancreas, adrenal glands, or spleen. No hypermetabolic lymph nodes in the abdomen or pelvis. Status post hysterectomy. Small amount free fluid seen in the pelvis. No evidence of inflammatory process or abscess. Diverticulosis of sigmoid colon seen, without evidence of diverticulitis. Small calcified gallstone noted, without evidence of cholecystitis. SKELETON: No focal hypermetabolic bone lesions to suggest skeletal metastasis. IMPRESSION: Recent hysterectomy with small amount of free fluid in pelvis. No evidence of the residual or metastatic carcinoma. Single 9 mm left level 2A cervical lymph node with  low-grade metabolic activity, which is nonspecific but likely reactive in etiology. Incidentally noted sigmoid diverticulosis and cholelithiasis. Electronically Signed   By: Earle Gell M.D.   On: 12/10/2016 07:41    ASSESSMENT & PLAN:  Endometrial cancer ([place]) We reviewed the NCCN guidelines We have reviewed imaging study We discussed the role of chemotherapy. The intent is of curative intent.  We discussed some of the risks, benefits, side-effects of carboplatin & Taxol. Treatment is intravenous, every 3 weeks x 6 cycles  Some of the short term side-effects included, though not limited to, including weight loss, life threatening infections, risk of allergic reactions, need for transfusions of blood products, nausea, vomiting, change in bowel habits, loss of hair, admission to hospital for various reasons, and risks of death.   Long term side-effects are also discussed including risks of infertility, permanent damage to nerve function,  hearing loss, chronic fatigue, kidney damage with possibility needing hemodialysis, and rare secondary malignancy including bone marrow disorders.  The patient is aware that the response rates discussed earlier is not guaranteed.  After a long discussion, patient made an informed decision to proceed with the prescribed plan of care.   Patient education material was dispensed. We discussed premedication with dexamethasone before chemotherapy. Given her age and prior diagnosis of thyroid cancer, I suspect she will be at risk of severe pancytopenia with treatment I recommend G-CSF support I recommend port placement I will alert her radiation oncologist about the timing of chemotherapy and she will receive concurrent radiation therapy in the future as well   History of thyroid cancer She has personal history of thyroid cancer status post thyroidectomy in 1998 She never require adjuvant radioactive iodine treatment She will continue on chronic suppressive  therapy with thyroid medicine   Orders Placed This Encounter  Procedures  . CBC with Differential    Standing Status:   Standing    Number of Occurrences:   20    Standing Expiration Date:   12/21/2017  . Comprehensive metabolic panel    Standing Status:   Standing    Number of Occurrences:   20    Standing Expiration Date:   12/21/2017   All questions were answered. The patient knows to call the clinic with any problems, questions or concerns. No barriers to learning was detected. I spent 25 minutes counseling the patient face to face. The total time spent in the appointment was 40 minutes and more than 50% was on counseling and review of test results     Deborah Lark, MD 12/21/2016 5:23 PM

## 2016-12-21 NOTE — Assessment & Plan Note (Addendum)
We reviewed the NCCN guidelines We have reviewed imaging study We discussed the role of chemotherapy. The intent is of curative intent.  We discussed some of the risks, benefits, side-effects of carboplatin & Taxol. Treatment is intravenous, every 3 weeks x 6 cycles  Some of the short term side-effects included, though not limited to, including weight loss, life threatening infections, risk of allergic reactions, need for transfusions of blood products, nausea, vomiting, change in bowel habits, loss of hair, admission to hospital for various reasons, and risks of death.   Long term side-effects are also discussed including risks of infertility, permanent damage to nerve function, hearing loss, chronic fatigue, kidney damage with possibility needing hemodialysis, and rare secondary malignancy including bone marrow disorders.  The patient is aware that the response rates discussed earlier is not guaranteed.  After a long discussion, patient made an informed decision to proceed with the prescribed plan of care.   Patient education material was dispensed. We discussed premedication with dexamethasone before chemotherapy. Given her age and prior diagnosis of thyroid cancer, I suspect she will be at risk of severe pancytopenia with treatment I recommend G-CSF support I recommend port placement I will alert her radiation oncologist about the timing of chemotherapy and she will receive concurrent radiation therapy in the future as well

## 2016-12-21 NOTE — Assessment & Plan Note (Signed)
She has personal history of thyroid cancer status post thyroidectomy in 1998 She never require adjuvant radioactive iodine treatment She will continue on chronic suppressive therapy with thyroid medicine

## 2016-12-22 ENCOUNTER — Other Ambulatory Visit: Payer: Self-pay | Admitting: Radiology

## 2016-12-23 ENCOUNTER — Encounter (HOSPITAL_COMMUNITY): Payer: Self-pay

## 2016-12-23 ENCOUNTER — Ambulatory Visit (HOSPITAL_COMMUNITY)
Admission: RE | Admit: 2016-12-23 | Discharge: 2016-12-23 | Disposition: A | Discharge status: Home / Self Care | Payer: Medicare Other | Source: Ambulatory Visit | Attending: Hematology and Oncology | Admitting: Hematology and Oncology

## 2016-12-23 ENCOUNTER — Other Ambulatory Visit: Payer: Self-pay | Admitting: Hematology and Oncology

## 2016-12-23 DIAGNOSIS — M19071 Primary osteoarthritis, right ankle and foot: Secondary | ICD-10-CM | POA: Diagnosis not present

## 2016-12-23 DIAGNOSIS — E039 Hypothyroidism, unspecified: Secondary | ICD-10-CM | POA: Diagnosis not present

## 2016-12-23 DIAGNOSIS — C569 Malignant neoplasm of unspecified ovary: Secondary | ICD-10-CM | POA: Insufficient documentation

## 2016-12-23 DIAGNOSIS — G629 Polyneuropathy, unspecified: Secondary | ICD-10-CM | POA: Insufficient documentation

## 2016-12-23 DIAGNOSIS — C541 Malignant neoplasm of endometrium: Secondary | ICD-10-CM

## 2016-12-23 HISTORY — PX: IR US GUIDE VASC ACCESS RIGHT: IMG2390

## 2016-12-23 HISTORY — PX: IR FLUORO GUIDE PORT INSERTION RIGHT: IMG5741

## 2016-12-23 HISTORY — DX: Myoneural disorder, unspecified: G70.9

## 2016-12-23 LAB — COMPREHENSIVE METABOLIC PANEL
ALT: 24 U/L (ref 14–54)
AST: 24 U/L (ref 15–41)
Albumin: 4.6 g/dL (ref 3.5–5.0)
Alkaline Phosphatase: 68 U/L (ref 38–126)
Anion gap: 7 (ref 5–15)
BUN: 23 mg/dL — ABNORMAL HIGH (ref 6–20)
CO2: 24 mmol/L (ref 22–32)
Calcium: 9 mg/dL (ref 8.9–10.3)
Chloride: 109 mmol/L (ref 101–111)
Creatinine, Ser: 0.66 mg/dL (ref 0.44–1.00)
GFR calc Af Amer: >60 mL/min (ref >60)
GFR calc non Af Amer: >60 mL/min (ref >60)
Glucose, Bld: 93 mg/dL (ref 65–99)
Potassium: 4.2 mmol/L (ref 3.5–5.1)
Sodium: 140 mmol/L (ref 135–145)
Total Bilirubin: 0.3 mg/dL (ref 0.3–1.2)
Total Protein: 7.9 g/dL (ref 6.5–8.1)

## 2016-12-23 LAB — CBC WITH DIFFERENTIAL/PLATELET
Basophils Absolute: 0.1 10*3/uL (ref 0.0–0.1)
Basophils Relative: 1 %
Eosinophils Absolute: 0.3 10*3/uL (ref 0.0–0.7)
Eosinophils Relative: 5 %
HCT: 37.3 % (ref 36.0–46.0)
Hemoglobin: 12.5 g/dL (ref 12.0–15.0)
Lymphocytes Relative: 30 %
Lymphs Abs: 1.5 10*3/uL (ref 0.7–4.0)
MCH: 32.2 pg (ref 26.0–34.0)
MCHC: 33.5 g/dL (ref 30.0–36.0)
MCV: 96.1 fL (ref 78.0–100.0)
Monocytes Absolute: 0.4 10*3/uL (ref 0.1–1.0)
Monocytes Relative: 8 %
Neutro Abs: 2.7 10*3/uL (ref 1.7–7.7)
Neutrophils Relative %: 56 %
Platelets: 164 10*3/uL (ref 150–400)
RBC: 3.88 MIL/uL (ref 3.87–5.11)
RDW: 12.7 % (ref 11.5–15.5)
WBC: 4.9 10*3/uL (ref 4.0–10.5)

## 2016-12-23 LAB — PROTIME-INR
INR: 0.92
Prothrombin Time: 12.2 seconds (ref 11.4–15.2)

## 2016-12-23 MED ORDER — MIDAZOLAM HCL 2 MG/2ML IJ SOLN
INTRAMUSCULAR | Status: AC | PRN
  Administered 2016-12-23 (×2): 1 mg via INTRAVENOUS

## 2016-12-23 MED ORDER — CEFAZOLIN SODIUM-DEXTROSE 2-4 GM/100ML-% IV SOLN
INTRAVENOUS | Status: AC
  Administered 2016-12-23: 2 g via INTRAVENOUS
  Filled 2016-12-23: qty 100

## 2016-12-23 MED ORDER — LIDOCAINE-EPINEPHRINE (PF) 2 %-1:200000 IJ SOLN
INTRAMUSCULAR | Status: AC
  Filled 2016-12-23: qty 20

## 2016-12-23 MED ORDER — HEPARIN SOD (PORK) LOCK FLUSH 100 UNIT/ML IV SOLN
INTRAVENOUS | Status: AC | PRN
  Administered 2016-12-23: 500 [IU] via INTRAVENOUS

## 2016-12-23 MED ORDER — MIDAZOLAM HCL 2 MG/2ML IJ SOLN
INTRAMUSCULAR | Status: AC
  Filled 2016-12-23: qty 2

## 2016-12-23 MED ORDER — FENTANYL CITRATE (PF) 100 MCG/2ML IJ SOLN
INTRAMUSCULAR | Status: AC | PRN
  Administered 2016-12-23 (×2): 50 ug via INTRAVENOUS

## 2016-12-23 MED ORDER — LIDOCAINE HCL 1 % IJ SOLN
INTRAMUSCULAR | Status: AC | PRN
  Administered 2016-12-23: 10 mL

## 2016-12-23 MED ORDER — LIDOCAINE-EPINEPHRINE (PF) 2 %-1:200000 IJ SOLN
INTRAMUSCULAR | Status: AC | PRN
  Administered 2016-12-23: 10 mL

## 2016-12-23 MED ORDER — FENTANYL CITRATE (PF) 100 MCG/2ML IJ SOLN
INTRAMUSCULAR | Status: AC
  Filled 2016-12-23: qty 2

## 2016-12-23 MED ORDER — HEPARIN SOD (PORK) LOCK FLUSH 100 UNIT/ML IV SOLN
INTRAVENOUS | Status: AC
  Filled 2016-12-23: qty 5

## 2016-12-23 MED ORDER — CEFAZOLIN SODIUM-DEXTROSE 2-4 GM/100ML-% IV SOLN
2.0000 g | INTRAVENOUS | Status: AC
  Administered 2016-12-23: 2 g via INTRAVENOUS

## 2016-12-23 MED ORDER — SODIUM CHLORIDE 0.9 % IV SOLN
INTRAVENOUS | Status: DC
  Administered 2016-12-23: 11:00:00 via INTRAVENOUS

## 2016-12-23 MED ORDER — LIDOCAINE HCL (PF) 2 % IJ SOLN
INTRAMUSCULAR | Status: AC
  Filled 2016-12-23: qty 10

## 2016-12-23 NOTE — Procedures (Signed)
Placement of right jugular portacath.  Tip at SVC/RA junction.  Minimal blood loss and no immediate complication.   

## 2016-12-23 NOTE — Discharge Instructions (Signed)
Implanted Port Home Guide °An implanted port is a type of central line that is placed under the skin. Central lines are used to provide IV access when treatment or nutrition needs to be given through a person’s veins. Implanted ports are used for long-term IV access. An implanted port may be placed because: °· You need IV medicine that would be irritating to the small veins in your hands or arms. °· You need long-term IV medicines, such as antibiotics. °· You need IV nutrition for a long period. °· You need frequent blood draws for lab tests. °· You need dialysis. ° °Implanted ports are usually placed in the chest area, but they can also be placed in the upper arm, the abdomen, or the leg. An implanted port has two main parts: °· Reservoir. The reservoir is round and will appear as a small, raised area under your skin. The reservoir is the part where a needle is inserted to give medicines or draw blood. °· Catheter. The catheter is a thin, flexible tube that extends from the reservoir. The catheter is placed into a large vein. Medicine that is inserted into the reservoir goes into the catheter and then into the vein. ° °How will I care for my incision site? °Do not get the incision site wet. Bathe or shower as directed by your health care provider. °How is my port accessed? °Special steps must be taken to access the port: °· Before the port is accessed, a numbing cream can be placed on the skin. This helps numb the skin over the port site. °· Your health care provider uses a sterile technique to access the port. °? Your health care provider must put on a mask and sterile gloves. °? The skin over your port is cleaned carefully with an antiseptic and allowed to dry. °? The port is gently pinched between sterile gloves, and a needle is inserted into the port. °· Only "non-coring" port needles should be used to access the port. Once the port is accessed, a blood return should be checked. This helps ensure that the port  is in the vein and is not clogged. °· If your port needs to remain accessed for a constant infusion, a clear (transparent) bandage will be placed over the needle site. The bandage and needle will need to be changed every week, or as directed by your health care provider. °· Keep the bandage covering the needle clean and dry. Do not get it wet. Follow your health care provider’s instructions on how to take a shower or bath while the port is accessed. °· If your port does not need to stay accessed, no bandage is needed over the port. ° °What is flushing? °Flushing helps keep the port from getting clogged. Follow your health care provider’s instructions on how and when to flush the port. Ports are usually flushed with saline solution or a medicine called heparin. The need for flushing will depend on how the port is used. °· If the port is used for intermittent medicines or blood draws, the port will need to be flushed: °? After medicines have been given. °? After blood has been drawn. °? As part of routine maintenance. °· If a constant infusion is running, the port may not need to be flushed. ° °How long will my port stay implanted? °The port can stay in for as long as your health care provider thinks it is needed. When it is time for the port to come out, surgery will be   done to remove it. The procedure is similar to the one performed when the port was put in. °When should I seek immediate medical care? °When you have an implanted port, you should seek immediate medical care if: °· You notice a bad smell coming from the incision site. °· You have swelling, redness, or drainage at the incision site. °· You have more swelling or pain at the port site or the surrounding area. °· You have a fever that is not controlled with medicine. ° °This information is not intended to replace advice given to you by your health care provider. Make sure you discuss any questions you have with your health care provider. °Document  Released: 03/08/2005 Document Revised: 08/14/2015 Document Reviewed: 11/13/2012 °[employer]vier Interactive Patient Education © 2017 [employer] °Moderate Conscious Sedation, Adult, Care After °These instructions provide you with information about caring for yourself after your procedure. Your health care provider may also give you more specific instructions. Your treatment has been planned according to current medical practices, but problems sometimes occur. Call your health care provider if you have any problems or questions after your procedure. °What can I expect after the procedure? °After your procedure, it is common: °· To feel sleepy for several hours. °· To feel clumsy and have poor balance for several hours. °· To have poor judgment for several hours. °· To vomit if you eat too soon. ° °Follow these instructions at home: °For at least 24 hours after the procedure: ° °· Do not: °? Participate in activities where you could fall or become injured. °? Drive. °? Use heavy machinery. °? Drink alcohol. °? Take sleeping pills or medicines that cause drowsiness. °? Make important decisions or sign legal documents. °? Take care of children on your own. °· Rest. °Eating and drinking °· Follow the diet recommended by your health care provider. °· If you vomit: °? Drink water, juice, or soup when you can drink without vomiting. °? Make sure you have little or no nausea before eating solid foods. °General instructions °· Have a responsible adult stay with you until you are awake and alert. °· Take over-the-counter and prescription medicines only as told by your health care provider. °· If you smoke, do not smoke without supervision. °· Keep all follow-up visits as told by your health care provider. This is important. °Contact a health care provider if: °· You keep feeling nauseous or you keep vomiting. °· You feel light-headed. °· You develop a rash. °· You have a fever. °Get help right away if: °· You have trouble  breathing. °This information is not intended to replace advice given to you by your health care provider. Make sure you discuss any questions you have with your health care provider. °Document Released: 12/27/2012 Document Revised: 08/11/2015 Document Reviewed: 06/28/2015 °[employer]vier Interactive Patient Education © 2018 [employer] ° °

## 2016-12-23 NOTE — Consult Note (Signed)
Chief Complaint: Patient was seen in consultation today for Port-A-Cath placement  Referring Physician(s): Gorsuch,Ni  Supervising Physician: Markus Daft  Patient Status: [facility] - Out-pt  History of Present Illness: [name] is a 66 y.o. 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 She presents today for Port-A-Cath placement for chemotherapy.  Past Medical History:  Diagnosis Date  . Cancer ([place]) 1998   thyroid ca  . Degenerative arthritis of toe joint, right    great toe  . Endometrial cancer ([place])   . Endometrial mass 10/15/2016  . Hypothyroidism   . Neuropathy    feet  . Postmenopausal bleeding 10/15/2016    Past Surgical History:  Procedure Laterality Date  . ABDOMINAL HYSTERECTOMY    . ABDOMINOPLASTY    . APPENDECTOMY    . BUNIONECTOMY WITH CHILECTOMY Right 03/03/2016   Procedure: Lillard Anes WITH CHILECTOMY;  Surgeon: [name], MD;  Location: [place];  Service: Orthopedics;  Laterality: Right;  . CHEILECTOMY Right 03/03/2016   Procedure: CHEILECTOMY RIGHT GREAT TOE;  Surgeon: [name], MD;  Location: [place];  Service: Orthopedics;  Laterality: Right;  . COLONOSCOPY    . COSMETIC SURGERY    . DILATATION & CURETTAGE/HYSTEROSCOPY WITH MYOSURE N/A 10/15/2016   Procedure: DILATATION & CURETTAGE/HYSTEROSCOPY WITH MYOSURE;  Surgeon: [name], MD;  Location: [facility];  Service: Gynecology;  Laterality: N/A;  Requests Microdilators and Lacrimal Duct Dilators  . OVARIAN CYST REMOVAL  1976  . ROBOTIC ASSISTED BILATERAL SALPINGO OOPHERECTOMY Bilateral 11/23/2016   Procedure: XI ROBOTIC ASSISTED LEFT OOPHORECTOMY;  Surgeon: [name], MD;  Location: [facility];  Service: Gynecology;  Laterality: Bilateral;  . ROBOTIC ASSISTED TOTAL HYSTERECTOMY Bilateral 11/23/2016   Procedure: XI ROBOTIC ASSISTED TOTAL  HYSTERECTOMY;  Surgeon: [name], MD;  Location: [facility];  Service: Gynecology;  Laterality: Bilateral;  . SENTINEL NODE BIOPSY Bilateral 11/23/2016   Procedure: SENTINEL NODE BIOPSY;  Surgeon: [name], MD;  Location: [facility];  Service: Gynecology;  Laterality: Bilateral;  . SHOULDER SURGERY Left 03/2015   by dr [name] at [place]  04/1996    Allergies: Patient has no known allergies.  Medications: Prior to Admission medications   Medication Sig Start Date End Date Taking? Authorizing Provider  dexamethasone (DECADRON) 4 MG tablet Take 5 tabs the day before chemo and 5 tabs the morning of chemo, every 3 weeks x 6 12/20/16   Heath Lark, MD  ibuprofen (ADVIL,MOTRIN) 200 MG tablet Take 600 mg by mouth 2 (two) times daily as needed for moderate pain.    [provider]  levothyroxine (SYNTHROID, LEVOTHROID) 137 MCG tablet TAKE ONE TABLET DAILY IN THE MORNING 1/2 HOUR PRIOR TO FOOD 11/01/16   [provider]  lidocaine-prilocaine (EMLA) cream Apply to affected area once 12/20/16   Heath Lark, MD  ondansetron (ZOFRAN) 8 MG tablet Take 1 tablet (8 mg total) by mouth every 8 (eight) hours as needed for refractory nausea / vomiting. Start on day 3 after chemo. 12/20/16   Heath Lark, MD  OVER THE COUNTER MEDICATION Take 5 tablets by mouth 3 (three) times a week. Pyridoxal 1, 2, 3, and 4 otc supplement  Vitamin package    [provider]  OVER THE COUNTER MEDICATION Take 1 tablet by mouth as directed. Sulfur 24 g7 otc supplement take 1 tablet daily 5 days out of the week  Adrenaline gland support    [provider]  OVER THE COUNTER MEDICATION Take 1 capsule by mouth See admin instructions. Carbon 90 bx otc supplement take 1 capsule daily 5 days out of the week  Omega 3 supplement    [provider]  prochlorperazine (COMPAZINE) 10 MG tablet Take 1 tablet (10 mg total) by mouth every 6 (six) hours as needed (Nausea or vomiting). 12/20/16    Heath Lark, MD     Family History  Problem Relation Age of Onset  . Breast cancer Sister     Social History   Social History  . Marital status: Married    Spouse name: Joe  . Number of children: 0  . Years of education: N/A   Occupational History  . retired    Social History Main Topics  . Smoking status: Never Smoker  . Smokeless tobacco: Never Used  . Alcohol use Yes     Comment: social  . Drug use: No  . Sexual activity: Not on file   Other Topics Concern  . Not on file   Social History Narrative  . No narrative on file      Review of Systems currently denies fever, headache, chest pain, dyspnea, cough, worsening abdominal pain, nausea, vomiting or bleeding.  Vital Signs: Vitals:   12/23/16 0936  BP: 121/66  Pulse: 72  Resp: 18  Temp: 98.2 F (36.8 C)  SpO2: 98%      Physical Exam awake, alert. Chest clear to auscultation bilaterally. Heart with regular rate and rhythm. Abdomen soft, positive bowel sounds, minimal tenderness to palpation. No lower extremity edema.  Imaging: Nm Pet Image Initial (pi) Skull Base To Thigh  Result Date: 12/10/2016 CLINICAL DATA:  Initial treatment strategy for endometrial carcinoma with lymph node metastases. status post TAH-BSO 2 weeks ago. EXAM: NUCLEAR MEDICINE PET SKULL BASE TO THIGH TECHNIQUE: 7.6 mCi F-18 FDG was injected intravenously. Full-ring PET imaging was performed from the skull base to thigh after the radiotracer. CT data was obtained and used for attenuation correction and anatomic localization. FASTING BLOOD GLUCOSE:  Value: 89 mg/dl COMPARISON:  None. FINDINGS: NECK: 9 mm left level 2A lymph node shows mild FDG uptake with SUV max of 3.2. No other hypermetabolic cervical lymph nodes or masses identified. CHEST: No hypermetabolic masses or lymphadenopathy. No suspicious pulmonary nodules seen on CT images. ABDOMEN/PELVIS: No abnormal hypermetabolic activity within the liver, pancreas, adrenal glands, or spleen.  No hypermetabolic lymph nodes in the abdomen or pelvis. Status post hysterectomy. Small amount free fluid seen in the pelvis. No evidence of inflammatory process or abscess. Diverticulosis of sigmoid colon seen, without evidence of diverticulitis. Small calcified gallstone noted, without evidence of cholecystitis. SKELETON: No focal hypermetabolic bone lesions to suggest skeletal metastasis. IMPRESSION: Recent hysterectomy with small amount of free fluid in pelvis. No evidence of the residual or metastatic carcinoma. Single 9 mm left level 2A cervical lymph node with low-grade metabolic activity, which is nonspecific but likely reactive in etiology. Incidentally noted sigmoid diverticulosis and cholelithiasis. Electronically Signed   By: Earle Gell M.D.   On: 12/10/2016 07:41    Labs:  CBC:  Recent Labs  10/04/16 1130 11/11/16 0959 11/24/16 0429  WBC 6.9 5.4 12.0*  HGB 12.2 11.9* 11.2*  HCT 36.2 35.5* 33.2*  PLT 176 194 183    COAGS: No results for input(s): INR, APTT in the last 8760 hours.  BMP:  Recent Labs  10/04/16 1130 11/11/16 0959 11/24/16 0429  NA 140  140 140  K 4.2 4.8 4.3  CL 107 109 110  CO2 27 26 24   GLUCOSE 85 91 145*  BUN 22* 24* 17  CALCIUM 9.1 9.1 8.7*  CREATININE 0.64 0.65 0.64  GFRNONAA >60 >60 >60  GFRAA >60 >60 >60    LIVER FUNCTION TESTS:  Recent Labs  11/11/16 0959  BILITOT 0.5  AST 20  ALT 17  ALKPHOS 64  PROT 7.2  ALBUMIN 4.2    TUMOR MARKERS: No results for input(s): AFPTM, CEA, CA199, CHROMGRNA in the last 8760 hours.  Assessment and Plan: 66 y.o. 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 She presents today for Port-A-Cath placement for chemotherapy.Risks and benefits discussed with the patient/spouse including, but not limited to bleeding, infection, pneumothorax, or fibrin sheath development and need for  additional procedures.All of the patient's questions were answered, patient is agreeable to proceed.Consent signed and in chart. Labs pending.     Thank you for this interesting consult.  I greatly enjoyed meeting KESHARA KIGER and look forward to participating in their care.  A copy of this report was sent to the requesting provider on this date.  Electronically Signed: D. Rowe Robert, PA-C 12/23/2016, 9:28 AM   I spent a total of 25 minutes    in face to face in clinical consultation, greater than 50% of which was counseling/coordinating care for Port-A-Cath placement

## 2016-12-27 ENCOUNTER — Ambulatory Visit: Payer: Medicare Other

## 2016-12-27 ENCOUNTER — Telehealth: Payer: Self-pay

## 2016-12-27 ENCOUNTER — Other Ambulatory Visit: Payer: Medicare Other

## 2016-12-27 ENCOUNTER — Other Ambulatory Visit: Payer: Self-pay | Admitting: Hematology and Oncology

## 2016-12-27 ENCOUNTER — Ambulatory Visit (HOSPITAL_BASED_OUTPATIENT_CLINIC_OR_DEPARTMENT_OTHER): Payer: Medicare Other

## 2016-12-27 ENCOUNTER — Telehealth: Payer: Self-pay | Admitting: *Deleted

## 2016-12-27 VITALS — BP 134/76 | HR 72 | Temp 98.3°F | Resp 16

## 2016-12-27 DIAGNOSIS — Z5111 Encounter for antineoplastic chemotherapy: Secondary | ICD-10-CM | POA: Diagnosis not present

## 2016-12-27 DIAGNOSIS — C541 Malignant neoplasm of endometrium: Secondary | ICD-10-CM | POA: Diagnosis not present

## 2016-12-27 DIAGNOSIS — Z5189 Encounter for other specified aftercare: Secondary | ICD-10-CM | POA: Diagnosis not present

## 2016-12-27 MED ORDER — SODIUM CHLORIDE 0.9% FLUSH
10.0000 mL | INTRAVENOUS | Status: DC | PRN
  Administered 2016-12-27: 10 mL
  Filled 2016-12-27: qty 10

## 2016-12-27 MED ORDER — PALONOSETRON HCL INJECTION 0.25 MG/5ML
INTRAVENOUS | Status: AC
  Filled 2016-12-27: qty 5

## 2016-12-27 MED ORDER — HEPARIN SOD (PORK) LOCK FLUSH 100 UNIT/ML IV SOLN
500.0000 [IU] | Freq: Once | INTRAVENOUS | Status: AC | PRN
  Administered 2016-12-27: 500 [IU]
  Filled 2016-12-27: qty 5

## 2016-12-27 MED ORDER — PEGFILGRASTIM 6 MG/0.6ML ~~LOC~~ PSKT
6.0000 mg | PREFILLED_SYRINGE | Freq: Once | SUBCUTANEOUS | Status: AC
  Administered 2016-12-27: 6 mg via SUBCUTANEOUS
  Filled 2016-12-27: qty 0.6

## 2016-12-27 MED ORDER — SODIUM CHLORIDE 0.9 % IV SOLN
175.0000 mg/m2 | Freq: Once | INTRAVENOUS | Status: AC
  Administered 2016-12-27: 330 mg via INTRAVENOUS
  Filled 2016-12-27: qty 55

## 2016-12-27 MED ORDER — SODIUM CHLORIDE 0.9 % IV SOLN
Freq: Once | INTRAVENOUS | Status: AC
  Administered 2016-12-27: 12:00:00 via INTRAVENOUS

## 2016-12-27 MED ORDER — DIPHENHYDRAMINE HCL 50 MG/ML IJ SOLN
50.0000 mg | Freq: Once | INTRAMUSCULAR | Status: AC
  Administered 2016-12-27: 50 mg via INTRAVENOUS

## 2016-12-27 MED ORDER — SODIUM CHLORIDE 0.9 % IV SOLN
530.0000 mg | Freq: Once | INTRAVENOUS | Status: AC
  Administered 2016-12-27: 530 mg via INTRAVENOUS
  Filled 2016-12-27: qty 53

## 2016-12-27 MED ORDER — PALONOSETRON HCL INJECTION 0.25 MG/5ML
0.2500 mg | Freq: Once | INTRAVENOUS | Status: AC
  Administered 2016-12-27: 0.25 mg via INTRAVENOUS

## 2016-12-27 MED ORDER — FAMOTIDINE IN NACL 20-0.9 MG/50ML-% IV SOLN
INTRAVENOUS | Status: AC
  Filled 2016-12-27: qty 50

## 2016-12-27 MED ORDER — SODIUM CHLORIDE 0.9 % IV SOLN
Freq: Once | INTRAVENOUS | Status: AC
  Administered 2016-12-27: 13:00:00 via INTRAVENOUS
  Filled 2016-12-27: qty 5

## 2016-12-27 MED ORDER — DIPHENHYDRAMINE HCL 50 MG/ML IJ SOLN
INTRAMUSCULAR | Status: AC
  Filled 2016-12-27: qty 1

## 2016-12-27 MED ORDER — FAMOTIDINE IN NACL 20-0.9 MG/50ML-% IV SOLN
20.0000 mg | Freq: Once | INTRAVENOUS | Status: AC
  Administered 2016-12-27: 20 mg via INTRAVENOUS

## 2016-12-27 NOTE — Telephone Encounter (Signed)
Pt is asking for rx for wig "cranial prosthesis". [employer] needs prior approval to help pay for wig. [facility] providers phone number is [phone]. Ref number [id number].   She is in infusion today for C1 at 1200.

## 2016-12-27 NOTE — Telephone Encounter (Signed)
Called patient to inform of HDR [place], lvm for a return call

## 2016-12-27 NOTE — Telephone Encounter (Signed)
[name], pls help

## 2016-12-27 NOTE — Patient Instructions (Addendum)
[facility] Discharge Instructions for Patients Receiving Chemotherapy  Today you received the following chemotherapy agents Taxol and Carboplatin.   To help prevent nausea and vomiting after your treatment, we encourage you to take your nausea medication as directed.   If you develop nausea and vomiting that is not controlled by your nausea medication, call the clinic.   BELOW ARE SYMPTOMS THAT SHOULD BE REPORTED IMMEDIATELY:  *FEVER GREATER THAN 100.5 F  *CHILLS WITH OR WITHOUT FEVER  NAUSEA AND VOMITING THAT IS NOT CONTROLLED WITH YOUR NAUSEA MEDICATION  *UNUSUAL SHORTNESS OF BREATH  *UNUSUAL BRUISING OR BLEEDING  TENDERNESS IN MOUTH AND THROAT WITH OR WITHOUT PRESENCE OF ULCERS  *URINARY PROBLEMS  *BOWEL PROBLEMS  UNUSUAL RASH Items with * indicate a potential emergency and should be followed up as soon as possible.  Feel free to call the clinic should you have any questions or concerns. The clinic phone number is [phone].  Please show the CHEMO ALERT CARD at check-in to the Emergency Department and triage nurse.   Paclitaxel injection What is this medicine? PACLITAXEL (PAK li TAX el) is a chemotherapy drug. It targets fast dividing cells, like cancer cells, and causes these cells to die. This medicine is used to treat ovarian cancer, breast cancer, and other cancers. This medicine may be used for other purposes; ask your health care provider or pharmacist if you have questions. COMMON BRAND NAME(S): Onxol, Taxol What should I tell my health care provider before I take this medicine? They need to know if you have any of these conditions: -blood disorders -irregular heartbeat -infection (especially a virus infection such as chickenpox, cold sores, or herpes) -liver disease -previous or ongoing radiation therapy -an unusual or allergic reaction to paclitaxel, alcohol, polyoxyethylated castor oil, other chemotherapy agents, other medicines, foods,  dyes, or preservatives -pregnant or trying to get pregnant -breast-feeding How should I use this medicine? This drug is given as an infusion into a vein. It is administered in a hospital or clinic by a specially trained health care professional. Talk to your pediatrician regarding the use of this medicine in children. Special care may be needed. Overdosage: If you think you have taken too much of this medicine contact a poison control center or emergency room at once. NOTE: This medicine is only for you. Do not share this medicine with others. What if I miss a dose? It is important not to miss your dose. Call your doctor or health care professional if you are unable to keep an appointment. What may interact with this medicine? Do not take this medicine with any of the following medications: -disulfiram -metronidazole This medicine may also interact with the following medications: -cyclosporine -diazepam -ketoconazole -medicines to increase blood counts like filgrastim, pegfilgrastim, sargramostim -other chemotherapy drugs like cisplatin, doxorubicin, epirubicin, etoposide, teniposide, vincristine -quinidine -testosterone -vaccines -verapamil Talk to your doctor or health care professional before taking any of these medicines: -acetaminophen -aspirin -ibuprofen -ketoprofen -naproxen This list may not describe all possible interactions. Give your health care provider a list of all the medicines, herbs, non-prescription drugs, or dietary supplements you use. Also tell them if you smoke, drink alcohol, or use illegal drugs. Some items may interact with your medicine. What should I watch for while using this medicine? Your condition will be monitored carefully while you are receiving this medicine. You will need important blood work done while you are taking this medicine. This medicine can cause serious allergic reactions. To reduce your risk you   will need to take other medicine(s)  before treatment with this medicine. If you experience allergic reactions like skin rash, itching or hives, swelling of the face, lips, or tongue, tell your doctor or health care professional right away. In some cases, you may be given additional medicines to help with side effects. Follow all directions for their use. This drug may make you feel generally unwell. This is not uncommon, as chemotherapy can affect healthy cells as well as cancer cells. Report any side effects. Continue your course of treatment even though you feel ill unless your doctor tells you to stop. Call your doctor or health care professional for advice if you get a fever, chills or sore throat, or other symptoms of a cold or flu. Do not treat yourself. This drug decreases your body's ability to fight infections. Try to avoid being around people who are sick. This medicine may increase your risk to bruise or bleed. Call your doctor or health care professional if you notice any unusual bleeding. Be careful brushing and flossing your teeth or using a toothpick because you may get an infection or bleed more easily. If you have any dental work done, tell your dentist you are receiving this medicine. Avoid taking products that contain aspirin, acetaminophen, ibuprofen, naproxen, or ketoprofen unless instructed by your doctor. These medicines may hide a fever. Do not become pregnant while taking this medicine. Women should inform their doctor if they wish to become pregnant or think they might be pregnant. There is a potential for serious side effects to an unborn child. Talk to your health care professional or pharmacist for more information. Do not breast-feed an infant while taking this medicine. Men are advised not to father a child while receiving this medicine. This product may contain alcohol. Ask your pharmacist or healthcare provider if this medicine contains alcohol. Be sure to tell all healthcare providers you are taking this  medicine. Certain medicines, like metronidazole and disulfiram, can cause an unpleasant reaction when taken with alcohol. The reaction includes flushing, headache, nausea, vomiting, sweating, and increased thirst. The reaction can last from 30 minutes to several hours. What side effects may I notice from receiving this medicine? Side effects that you should report to your doctor or health care professional as soon as possible: -allergic reactions like skin rash, itching or hives, swelling of the face, lips, or tongue -low blood counts - This drug may decrease the number of white blood cells, red blood cells and platelets. You may be at increased risk for infections and bleeding. -signs of infection - fever or chills, cough, sore throat, pain or difficulty passing urine -signs of decreased platelets or bleeding - bruising, pinpoint red spots on the skin, black, tarry stools, nosebleeds -signs of decreased red blood cells - unusually weak or tired, fainting spells, lightheadedness -breathing problems -chest pain -high or low blood pressure -mouth sores -nausea and vomiting -pain, swelling, redness or irritation at the injection site -pain, tingling, numbness in the hands or feet -slow or irregular heartbeat -swelling of the ankle, feet, hands Side effects that usually do not require medical attention (report to your doctor or health care professional if they continue or are bothersome): -bone pain -complete hair loss including hair on your head, underarms, pubic hair, eyebrows, and eyelashes -changes in the color of fingernails -diarrhea -loosening of the fingernails -loss of appetite -muscle or joint pain -red flush to skin -sweating This list may not describe all possible side effects. Call your doctor for   medical advice about side effects. You may report side effects to FDA at 1-800-FDA-1088. Where should I keep my medicine? This drug is given in a hospital or clinic and will not be  stored at home. NOTE: This sheet is a summary. It may not cover all possible information. If you have questions about this medicine, talk to your doctor, pharmacist, or health care provider.  2018 Elsevier/Gold Standard (2015-01-07 19:58:00)   Carboplatin injection What is this medicine? CARBOPLATIN (KAR boe pla tin) is a chemotherapy drug. It targets fast dividing cells, like cancer cells, and causes these cells to die. This medicine is used to treat ovarian cancer and many other cancers. This medicine may be used for other purposes; ask your health care provider or pharmacist if you have questions. COMMON BRAND NAME(S): Paraplatin What should I tell my health care provider before I take this medicine? They need to know if you have any of these conditions: -blood disorders -hearing problems -kidney disease -recent or ongoing radiation therapy -an unusual or allergic reaction to carboplatin, cisplatin, other chemotherapy, other medicines, foods, dyes, or preservatives -pregnant or trying to get pregnant -breast-feeding How should I use this medicine? This drug is usually given as an infusion into a vein. It is administered in a hospital or clinic by a specially trained health care professional. Talk to your pediatrician regarding the use of this medicine in children. Special care may be needed. Overdosage: If you think you have taken too much of this medicine contact a poison control center or emergency room at once. NOTE: This medicine is only for you. Do not share this medicine with others. What if I miss a dose? It is important not to miss a dose. Call your doctor or health care professional if you are unable to keep an appointment. What may interact with this medicine? -medicines for seizures -medicines to increase blood counts like filgrastim, pegfilgrastim, sargramostim -some antibiotics like amikacin, gentamicin, neomycin, streptomycin, tobramycin -vaccines Talk to your doctor  or health care professional before taking any of these medicines: -acetaminophen -aspirin -ibuprofen -ketoprofen -naproxen This list may not describe all possible interactions. Give your health care provider a list of all the medicines, herbs, non-prescription drugs, or dietary supplements you use. Also tell them if you smoke, drink alcohol, or use illegal drugs. Some items may interact with your medicine. What should I watch for while using this medicine? Your condition will be monitored carefully while you are receiving this medicine. You will need important blood work done while you are taking this medicine. This drug may make you feel generally unwell. This is not uncommon, as chemotherapy can affect healthy cells as well as cancer cells. Report any side effects. Continue your course of treatment even though you feel ill unless your doctor tells you to stop. In some cases, you may be given additional medicines to help with side effects. Follow all directions for their use. Call your doctor or health care professional for advice if you get a fever, chills or sore throat, or other symptoms of a cold or flu. Do not treat yourself. This drug decreases your body's ability to fight infections. Try to avoid being around people who are sick. This medicine may increase your risk to bruise or bleed. Call your doctor or health care professional if you notice any unusual bleeding. Be careful brushing and flossing your teeth or using a toothpick because you may get an infection or bleed more easily. If you have any dental work done,   tell your dentist you are receiving this medicine. Avoid taking products that contain aspirin, acetaminophen, ibuprofen, naproxen, or ketoprofen unless instructed by your doctor. These medicines may hide a fever. Do not become pregnant while taking this medicine. Women should inform their doctor if they wish to become pregnant or think they might be pregnant. There is a potential  for serious side effects to an unborn child. Talk to your health care professional or pharmacist for more information. Do not breast-feed an infant while taking this medicine. What side effects may I notice from receiving this medicine? Side effects that you should report to your doctor or health care professional as soon as possible: -allergic reactions like skin rash, itching or hives, swelling of the face, lips, or tongue -signs of infection - fever or chills, cough, sore throat, pain or difficulty passing urine -signs of decreased platelets or bleeding - bruising, pinpoint red spots on the skin, black, tarry stools, nosebleeds -signs of decreased red blood cells - unusually weak or tired, fainting spells, lightheadedness -breathing problems -changes in hearing -changes in vision -chest pain -high blood pressure -low blood counts - This drug may decrease the number of white blood cells, red blood cells and platelets. You may be at increased risk for infections and bleeding. -nausea and vomiting -pain, swelling, redness or irritation at the injection site -pain, tingling, numbness in the hands or feet -problems with balance, talking, walking -trouble passing urine or change in the amount of urine Side effects that usually do not require medical attention (report to your doctor or health care professional if they continue or are bothersome): -hair loss -loss of appetite -metallic taste in the mouth or changes in taste This list may not describe all possible side effects. Call your doctor for medical advice about side effects. You may report side effects to FDA at 1-800-FDA-1088. Where should I keep my medicine? This drug is given in a hospital or clinic and will not be stored at home. NOTE: This sheet is a summary. It may not cover all possible information. If you have questions about this medicine, talk to your doctor, pharmacist, or health care provider.  2018 Elsevier/Gold Standard  (2007-06-13 14:38:05)  

## 2016-12-28 ENCOUNTER — Telehealth: Payer: Self-pay | Admitting: *Deleted

## 2016-12-28 ENCOUNTER — Encounter: Payer: Self-pay | Admitting: *Deleted

## 2016-12-28 NOTE — Telephone Encounter (Signed)
Attempted to call [facility] for prior authorization . Needs CPT code. Gaspar Bidding to check with Adela Lank in coding for number

## 2016-12-28 NOTE — Telephone Encounter (Signed)
Called for chemo follow up. To call if has questions or concerns

## 2016-12-28 NOTE — Telephone Encounter (Signed)
-----   Message from Arty Baumgartner, RN sent at 12/27/2016  4:03 PM EDT ----- Regarding: First time chemo/ Gorsuch  First taxol/carbo.  Pt tolerated well.  Dr Alvy Bimler

## 2016-12-30 ENCOUNTER — Ambulatory Visit: Payer: Medicare Other

## 2016-12-31 ENCOUNTER — Telehealth: Payer: Self-pay

## 2016-12-31 NOTE — Telephone Encounter (Signed)
Patient called and left message. She is wanting a update on wig prosthesis. The shop she is getting wig will hold until today.

## 2017-01-06 ENCOUNTER — Telehealth: Payer: Self-pay | Admitting: *Deleted

## 2017-01-06 NOTE — Telephone Encounter (Signed)
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  Transferred call to [place].

## 2017-01-07 ENCOUNTER — Encounter: Payer: Self-pay | Admitting: Hematology and Oncology

## 2017-01-07 NOTE — Progress Notes (Signed)
Call patient to discuss the issue with trying to obtain authorization with [facility] for a wig. No answer, left message to call me back.

## 2017-01-10 ENCOUNTER — Encounter: Payer: Self-pay | Admitting: Hematology and Oncology

## 2017-01-10 NOTE — Progress Notes (Signed)
Returned patient's call regarding wig, no answer. Left message to call me back.

## 2017-01-11 ENCOUNTER — Encounter: Payer: Self-pay | Admitting: Hematology and Oncology

## 2017-01-11 NOTE — Progress Notes (Signed)
Called patient no answer. Patient called me back left message stating okay to leave a detail message on her voice mail. Advised per [facility] ref# [id number], based on medicare guidelines they will not cover the wig and payment is not guarantee.

## 2017-01-17 ENCOUNTER — Ambulatory Visit (HOSPITAL_BASED_OUTPATIENT_CLINIC_OR_DEPARTMENT_OTHER): Payer: Medicare Other | Admitting: Hematology and Oncology

## 2017-01-17 ENCOUNTER — Ambulatory Visit: Payer: Medicare Other

## 2017-01-17 ENCOUNTER — Other Ambulatory Visit (HOSPITAL_BASED_OUTPATIENT_CLINIC_OR_DEPARTMENT_OTHER): Payer: Medicare Other

## 2017-01-17 ENCOUNTER — Ambulatory Visit (HOSPITAL_BASED_OUTPATIENT_CLINIC_OR_DEPARTMENT_OTHER): Payer: Medicare Other

## 2017-01-17 ENCOUNTER — Telehealth: Payer: Self-pay | Admitting: *Deleted

## 2017-01-17 ENCOUNTER — Ambulatory Visit: Payer: Self-pay | Admitting: Radiation Oncology

## 2017-01-17 DIAGNOSIS — C541 Malignant neoplasm of endometrium: Secondary | ICD-10-CM

## 2017-01-17 DIAGNOSIS — Z5111 Encounter for antineoplastic chemotherapy: Secondary | ICD-10-CM

## 2017-01-17 DIAGNOSIS — Z95828 Presence of other vascular implants and grafts: Secondary | ICD-10-CM

## 2017-01-17 DIAGNOSIS — G620 Drug-induced polyneuropathy: Secondary | ICD-10-CM | POA: Diagnosis not present

## 2017-01-17 DIAGNOSIS — T451X5A Adverse effect of antineoplastic and immunosuppressive drugs, initial encounter: Principal | ICD-10-CM

## 2017-01-17 LAB — CBC WITH DIFFERENTIAL/PLATELET
BASO%: 0.1 % (ref 0.0–2.0)
Basophils Absolute: 0 10*3/uL (ref 0.0–0.1)
EOS%: 0 % (ref 0.0–7.0)
Eosinophils Absolute: 0 10*3/uL (ref 0.0–0.5)
HCT: 35.2 % (ref 34.8–46.6)
HGB: 12 g/dL (ref 11.6–15.9)
LYMPH%: 8.7 % — ABNORMAL LOW (ref 14.0–49.7)
MCH: 32.6 pg (ref 25.1–34.0)
MCHC: 34.1 g/dL (ref 31.5–36.0)
MCV: 95.5 fL (ref 79.5–101.0)
MONO#: 0.1 10*3/uL (ref 0.1–0.9)
MONO%: 1 % (ref 0.0–14.0)
NEUT#: 7.7 10*3/uL — ABNORMAL HIGH (ref 1.5–6.5)
NEUT%: 90.2 % — ABNORMAL HIGH (ref 38.4–76.8)
Platelets: 272 10*3/uL (ref 145–400)
RBC: 3.68 10*6/uL — ABNORMAL LOW (ref 3.70–5.45)
RDW: 13.6 % (ref 11.2–14.5)
WBC: 8.6 10*3/uL (ref 3.9–10.3)
lymph#: 0.7 10*3/uL — ABNORMAL LOW (ref 0.9–3.3)

## 2017-01-17 LAB — COMPREHENSIVE METABOLIC PANEL
ALT: 19 U/L (ref 0–55)
AST: 14 U/L (ref 5–34)
Albumin: 4.1 g/dL (ref 3.5–5.0)
Alkaline Phosphatase: 86 U/L (ref 40–150)
Anion Gap: 10 mEq/L (ref 3–11)
BUN: 25.4 mg/dL (ref 7.0–26.0)
CO2: 22 mEq/L (ref 22–29)
Calcium: 9.7 mg/dL (ref 8.4–10.4)
Chloride: 108 mEq/L (ref 98–109)
Creatinine: 0.7 mg/dL (ref 0.6–1.1)
EGFR: >60 mL/min/{1.73_m2} (ref >60)
Glucose: 169 mg/dl — ABNORMAL HIGH (ref 70–140)
Potassium: 4.2 mEq/L (ref 3.5–5.1)
Sodium: 140 mEq/L (ref 136–145)
Total Bilirubin: 0.31 mg/dL (ref 0.20–1.20)
Total Protein: 7.5 g/dL (ref 6.4–8.3)

## 2017-01-17 MED ORDER — PEGFILGRASTIM 6 MG/0.6ML ~~LOC~~ PSKT
6.0000 mg | PREFILLED_SYRINGE | Freq: Once | SUBCUTANEOUS | Status: AC
  Administered 2017-01-17: 6 mg via SUBCUTANEOUS
  Filled 2017-01-17: qty 0.6

## 2017-01-17 MED ORDER — SODIUM CHLORIDE 0.9 % IV SOLN
Freq: Once | INTRAVENOUS | Status: AC
  Administered 2017-01-17: 11:00:00 via INTRAVENOUS

## 2017-01-17 MED ORDER — HEPARIN SOD (PORK) LOCK FLUSH 100 UNIT/ML IV SOLN
500.0000 [IU] | Freq: Once | INTRAVENOUS | Status: AC | PRN
  Administered 2017-01-17: 500 [IU]
  Filled 2017-01-17: qty 5

## 2017-01-17 MED ORDER — DIPHENHYDRAMINE HCL 50 MG/ML IJ SOLN
50.0000 mg | Freq: Once | INTRAMUSCULAR | Status: AC
  Administered 2017-01-17: 50 mg via INTRAVENOUS

## 2017-01-17 MED ORDER — FAMOTIDINE IN NACL 20-0.9 MG/50ML-% IV SOLN
INTRAVENOUS | Status: AC
  Filled 2017-01-17: qty 50

## 2017-01-17 MED ORDER — PALONOSETRON HCL INJECTION 0.25 MG/5ML
INTRAVENOUS | Status: AC
  Filled 2017-01-17: qty 5

## 2017-01-17 MED ORDER — SODIUM CHLORIDE 0.9 % IV SOLN
140.0000 mg/m2 | Freq: Once | INTRAVENOUS | Status: AC
  Administered 2017-01-17: 264 mg via INTRAVENOUS
  Filled 2017-01-17: qty 44

## 2017-01-17 MED ORDER — SODIUM CHLORIDE 0.9% FLUSH
10.0000 mL | INTRAVENOUS | Status: DC | PRN
  Administered 2017-01-17: 10 mL
  Filled 2017-01-17: qty 10

## 2017-01-17 MED ORDER — SODIUM CHLORIDE 0.9 % IV SOLN
530.0000 mg | Freq: Once | INTRAVENOUS | Status: AC
  Administered 2017-01-17: 530 mg via INTRAVENOUS
  Filled 2017-01-17: qty 53

## 2017-01-17 MED ORDER — SODIUM CHLORIDE 0.9% FLUSH
10.0000 mL | INTRAVENOUS | Status: DC | PRN
  Administered 2017-01-17: 10 mL via INTRAVENOUS
  Filled 2017-01-17: qty 10

## 2017-01-17 MED ORDER — FOSAPREPITANT DIMEGLUMINE INJECTION 150 MG
Freq: Once | INTRAVENOUS | Status: AC
  Administered 2017-01-17: 12:00:00 via INTRAVENOUS
  Filled 2017-01-17: qty 5

## 2017-01-17 MED ORDER — DIPHENHYDRAMINE HCL 50 MG/ML IJ SOLN
INTRAMUSCULAR | Status: AC
  Filled 2017-01-17: qty 1

## 2017-01-17 MED ORDER — PALONOSETRON HCL INJECTION 0.25 MG/5ML
0.2500 mg | Freq: Once | INTRAVENOUS | Status: AC
  Administered 2017-01-17: 0.25 mg via INTRAVENOUS

## 2017-01-17 MED ORDER — GABAPENTIN 300 MG PO CAPS: 300.0000 mg | ORAL_CAPSULE | Freq: Three times a day (TID) | ORAL | 9 refills | Status: DC

## 2017-01-17 MED ORDER — FAMOTIDINE IN NACL 20-0.9 MG/50ML-% IV SOLN
20.0000 mg | Freq: Once | INTRAVENOUS | Status: AC
  Administered 2017-01-17: 20 mg via INTRAVENOUS

## 2017-01-17 NOTE — Patient Instructions (Signed)
[facility] Discharge Instructions for Patients Receiving Chemotherapy  Today you received the following chemotherapy agents Paclitaxel; Carboplatin  To help prevent nausea and vomiting after your treatment, we encourage you to take your nausea medication as directed   If you develop nausea and vomiting that is not controlled by your nausea medication, call the clinic.   BELOW ARE SYMPTOMS THAT SHOULD BE REPORTED IMMEDIATELY:  *FEVER GREATER THAN 100.5 F  *CHILLS WITH OR WITHOUT FEVER  NAUSEA AND VOMITING THAT IS NOT CONTROLLED WITH YOUR NAUSEA MEDICATION  *UNUSUAL SHORTNESS OF BREATH  *UNUSUAL BRUISING OR BLEEDING  TENDERNESS IN MOUTH AND THROAT WITH OR WITHOUT PRESENCE OF ULCERS  *URINARY PROBLEMS  *BOWEL PROBLEMS  UNUSUAL RASH Items with * indicate a potential emergency and should be followed up as soon as possible.  Feel free to call the clinic should you have any questions or concerns. The clinic phone number is [phone].  Please show the CHEMO ALERT CARD at check-in to the Emergency Department and triage nurse.   

## 2017-01-17 NOTE — Telephone Encounter (Signed)
CALLED PATIENT TO REMIND OF HDR [place] FOR 01-18-17, LVM FOR A RETURN CALL

## 2017-01-18 ENCOUNTER — Encounter: Payer: Self-pay | Admitting: Hematology and Oncology

## 2017-01-18 ENCOUNTER — Ambulatory Visit
Admission: RE | Admit: 2017-01-18 | Discharge: 2017-01-18 | Disposition: A | Discharge status: Home / Self Care | Payer: Medicare Other | Source: Ambulatory Visit | Attending: Radiation Oncology | Admitting: Radiation Oncology

## 2017-01-18 DIAGNOSIS — C541 Malignant neoplasm of endometrium: Secondary | ICD-10-CM

## 2017-01-18 DIAGNOSIS — Z510 Encounter for antineoplastic radiation therapy: Secondary | ICD-10-CM | POA: Diagnosis not present

## 2017-01-18 NOTE — Progress Notes (Signed)
[name] presents for her HDR vag cuff today. She denies pain. She denies vaginal discharge. She received chemotherapy yesterday. She is feeling well today.   BP (!) 133/53   Pulse 88   Temp 98.3 F (36.8 C)   Resp 18   Ht 5\' 9"  (1.753 m)   Wt 156 lb 12.8 oz (71.1 kg)   SpO2 97%   BMI 23.16 kg/m    Wt Readings from Last 3 Encounters:  01/18/17 156 lb 12.8 oz (71.1 kg)  01/17/17 154 lb (69.9 kg)  12/23/16 155 lb (70.3 kg)

## 2017-01-18 NOTE — Progress Notes (Signed)
Radiation Oncology         [phone] ________________________________  Name: [name] MRN: [id number]  Date: 01/18/2017  DOB: [birth year]  Vaginal Brachytherapy Procedure Note  CC: [name], NP [name], MD    ICD-10-CM   1. Endometrial cancer ([place]) C54.1    Diagnosis Endometrial adenocarcinoma, FIGO grade III, stage pT1b,snpN1a :     Narrative: She returns today for vaginal cylinder fitting. She received her second cycle of chemotherapy yesterday. She has noticed some mild increase in her peripheral neuropathy and therefore her Taxol was reduced on the second and with therapy administration. Patient denies any vaginal bleeding or pelvic pain.  ALLERGIES: has No Known Allergies.  Meds: Current Outpatient Prescriptions  Medication Sig Dispense Refill  . dexamethasone (DECADRON) 4 MG tablet Take 5 tabs the day before chemo and 5 tabs the morning of chemo, every 3 weeks x 6 60 tablet 1  . ibuprofen (ADVIL,MOTRIN) 200 MG tablet Take 600 mg by mouth 2 (two) times daily as needed for moderate pain.    Marland Kitchen levothyroxine (SYNTHROID, LEVOTHROID) 137 MCG tablet TAKE ONE TABLET DAILY IN THE MORNING 1/2 HOUR PRIOR TO FOOD  5  . lidocaine-prilocaine (EMLA) cream Apply to affected area once 30 g 3  . ondansetron (ZOFRAN) 8 MG tablet Take 1 tablet (8 mg total) by mouth every 8 (eight) hours as needed for refractory nausea / vomiting. Start on day 3 after chemo. 30 tablet 1  . OVER THE COUNTER MEDICATION Take 5 tablets by mouth 3 (three) times a week. Pyridoxal 1, 2, 3, and 4 otc supplement  Vitamin package    . OVER THE COUNTER MEDICATION Take 1 tablet by mouth as directed. Sulfur 24 g7 otc supplement take 1 tablet daily 5 days out of the week  Adrenaline gland support    . OVER THE COUNTER MEDICATION Take 1 capsule by mouth See admin instructions. Carbon 90 bx otc supplement take 1 capsule daily 5 days out of the week  Omega 3 supplement    . prochlorperazine (COMPAZINE) 10 MG  tablet Take 1 tablet (10 mg total) by mouth every 6 (six) hours as needed (Nausea or vomiting). 30 tablet 1  . gabapentin (NEURONTIN) 300 MG capsule Take 1 capsule (300 mg total) by mouth 3 (three) times daily. (Patient not taking: Reported on 01/18/2017) 90 capsule 9   No current facility-administered medications for this encounter.     Physical Findings: The patient is in no acute distress. Patient is alert and oriented.  height is 5\' 9"  (1.753 m) and weight is 156 lb 12.8 oz (71.1 kg). Her temperature is 98.3 F (36.8 C). Her blood pressure is 133/53 (abnormal) and her pulse is 88. Her respiration is 18 and oxygen saturation is 97%.   No palpable cervical, supraclavicular or axillary lymphoadenopathy. The heart has a regular rate and rhythm. The lungs are clear to auscultation. Abdomen soft and non-tender.  On pelvic examination the external genitalia were unremarkable. A speculum exam was performed. Vaginal cuff intact, no mucosal lesions. On bimanual exam there were no pelvic masses appreciated.  Lab Findings: Lab Results  Component Value Date   WBC 8.6 01/17/2017   HGB 12.0 01/17/2017   HCT 35.2 01/17/2017   MCV 95.5 01/17/2017   PLT 272 01/17/2017    Radiographic Findings: Ir US Guide Vasc Access Right  Result Date: 12/23/2016 INDICATION: Endometrioid adenocarcinoma.  Port-A-Cath needed for treatment. EXAM: FLUOROSCOPIC AND ULTRASOUND GUIDED PLACEMENT OF A SUBCUTANEOUS PORT COMPARISON:  None. MEDICATIONS: Ancef 2 g; The antibiotic was administered within an appropriate time interval prior to skin puncture. ANESTHESIA/SEDATION: Versed 2.0 mg IV; Fentanyl 100 mcg IV; Moderate Sedation Time:  35 minutes The patient was continuously monitored during the procedure by the interventional radiology nurse under my direct supervision. FLUOROSCOPY TIME:  18 seconds, 1 mGy COMPLICATIONS: None immediate. PROCEDURE: The procedure, risks, benefits, and alternatives were explained to the patient.  Questions regarding the procedure were encouraged and answered. The patient understands and consents to the procedure. Patient was placed supine on the interventional table. Ultrasound confirmed a patent right internal jugular vein. The right chest and neck were cleaned with a skin antiseptic and a sterile drape was placed. Maximal barrier sterile technique was utilized including caps, mask, sterile gowns, sterile gloves, sterile drape, hand hygiene and skin antiseptic. The right neck was anesthetized with 1% lidocaine. Small incision was made in the right neck with a blade. Micropuncture set was placed in the right internal jugular vein with ultrasound guidance. The micropuncture wire was used for measurement purposes. The right chest was anesthetized with 1% lidocaine with epinephrine. #15 blade was used to make an incision and a subcutaneous port pocket was formed. [place] was assembled. Subcutaneous tunnel was formed with a stiff tunneling device. The port catheter was brought through the subcutaneous tunnel. The port was placed in the subcutaneous pocket. The micropuncture set was exchanged for a peel-away sheath. The catheter was placed through the peel-away sheath and the tip was positioned at the superior cavoatrial junction. Catheter placement was confirmed with fluoroscopy. The port was accessed and flushed with heparinized saline. The port pocket was closed using two layers of absorbable sutures and Dermabond. The vein skin site was closed using a single layer of absorbable suture and Dermabond. Sterile dressings were applied. Patient tolerated the procedure well without an immediate complication. Ultrasound and fluoroscopic images were taken and saved for this procedure. IMPRESSION: Placement of a subcutaneous port device. Electronically Signed   By: Markus Daft M.D.   On: 12/23/2016 13:20   Ir Fluoro Guide Port Insertion Right  Result Date: 12/23/2016 INDICATION: Endometrioid  adenocarcinoma.  Port-A-Cath needed for treatment. EXAM: FLUOROSCOPIC AND ULTRASOUND GUIDED PLACEMENT OF A SUBCUTANEOUS PORT COMPARISON:  None. MEDICATIONS: Ancef 2 g; The antibiotic was administered within an appropriate time interval prior to skin puncture. ANESTHESIA/SEDATION: Versed 2.0 mg IV; Fentanyl 100 mcg IV; Moderate Sedation Time:  35 minutes The patient was continuously monitored during the procedure by the interventional radiology nurse under my direct supervision. FLUOROSCOPY TIME:  18 seconds, 1 mGy COMPLICATIONS: None immediate. PROCEDURE: The procedure, risks, benefits, and alternatives were explained to the patient. Questions regarding the procedure were encouraged and answered. The patient understands and consents to the procedure. Patient was placed supine on the interventional table. Ultrasound confirmed a patent right internal jugular vein. The right chest and neck were cleaned with a skin antiseptic and a sterile drape was placed. Maximal barrier sterile technique was utilized including caps, mask, sterile gowns, sterile gloves, sterile drape, hand hygiene and skin antiseptic. The right neck was anesthetized with 1% lidocaine. Small incision was made in the right neck with a blade. Micropuncture set was placed in the right internal jugular vein with ultrasound guidance. The micropuncture wire was used for measurement purposes. The right chest was anesthetized with 1% lidocaine with epinephrine. #15 blade was used to make an incision and a subcutaneous port pocket was formed. [place] was assembled. Subcutaneous  tunnel was formed with a stiff tunneling device. The port catheter was brought through the subcutaneous tunnel. The port was placed in the subcutaneous pocket. The micropuncture set was exchanged for a peel-away sheath. The catheter was placed through the peel-away sheath and the tip was positioned at the superior cavoatrial junction. Catheter placement was confirmed with  fluoroscopy. The port was accessed and flushed with heparinized saline. The port pocket was closed using two layers of absorbable sutures and Dermabond. The vein skin site was closed using a single layer of absorbable suture and Dermabond. Sterile dressings were applied. Patient tolerated the procedure well without an immediate complication. Ultrasound and fluoroscopic images were taken and saved for this procedure. IMPRESSION: Placement of a subcutaneous port device. Electronically Signed   By: Markus Daft M.D.   On: 12/23/2016 13:20    Impression: Endometrial adenocarcinoma, FIGO grade III, stage pT1b,snpN1a   Patient was fitted for a vaginal cylinder. The patient will be treated with a 2.5 cm diameter cylinder with a treatment length of 3.5 cm. This distended the vaginal vault without undue discomfort. The patient tolerated the procedure well.  The patient was successfully fitted for a vaginal cylinder. The patient is appropriate to begin vaginal brachytherapy.   Plan: The patient will proceed with CT simulation and vaginal brachytherapy today.    _______________________________   Blair Promise, PhD, MD

## 2017-01-18 NOTE — Progress Notes (Signed)
  Radiation Oncology         [phone] ________________________________  Name: NINETTA ADELSTEIN MRN: [id number]  Date: 01/18/2017  DOB: July 14, 1950  SIMULATION AND TREATMENT PLANNING NOTE HDR BRACHYTHERAPY  DIAGNOSIS:  Endometrial adenocarcinoma, FIGO grade III, stage pT1b,snpN1a   NARRATIVE:  The patient was brought to the [place] suite.  Identity was confirmed.  All relevant records and images related to the planned course of therapy were reviewed.  The patient freely provided informed written consent to proceed with treatment after reviewing the details related to the planned course of therapy. The consent form was witnessed and verified by the simulation staff.  Then, the patient was set-up in a stable reproducible  supine position for radiation therapy.  CT images were obtained.  Surface markings were placed.  The CT images were loaded into the planning software.  Then the target and avoidance structures were contoured.  Treatment planning then occurred.  The radiation prescription was entered and confirmed.   I have requested : Brachytherapy Isodose Plan and Dosimetry Calculations to plan the radiation distribution.    PLAN:  The patient will receive 30 Gy in 5 fractions directed at the vaginal cuff region. The patient will be treated with a 2.5 cm segmented cylinder with a treatment length of 3.5 cm. Iridium 192 will be the high-dose-rate source. Prescription will be 6 Gy to the mucosal surface.    ________________________________  Blair Promise, PhD, MD

## 2017-01-18 NOTE — Assessment & Plan Note (Signed)
She tolerated the treatment reasonably well but did have some mild nausea and a flare of neuropathy I recommend reducing the dose of Taxol and also prescribed gabapentin I will reassess symptom control in her next visit

## 2017-01-18 NOTE — Progress Notes (Signed)
[facility] OFFICE PROGRESS NOTE  Patient Care Team: Lynden Ang, NP as PCP - General (Obstetrics and Gynecology)  SUMMARY OF ONCOLOGIC HISTORY: Oncology History   MSI stable     Endometrial cancer (HCC)   10/15/2016 Pathology Results    Endometrium, resection - ENDOMETRIOID ADENOCARCINOMA. - SEE COMMENT. Microscopic Comment The carcinoma appears FIGO grade II. There are foci of adenocarcinoma involving smooth muscle.       10/15/2016 Surgery    Preoperative diagnosis: Postmenopausal bleeding, endometrial mass/ thick endometrial stripe.                                       Cervical stenosis    Postop diagnosis: Same.   Procedure: Hysteroscopic resection of endometrial mass with Myosure Lite and dilatation and curettage Anesthesia General  Surgeon: Shea Evans, MD  IV fluids: LR 1 liter Estimated blood loss : 100 cc  Saline Fluid deficit: 1060 cc  Urine output: straight catheter preop 25 cc  Complications none  Condition stable  Disposition PACU  Specimen: Endometrial mass resected tissue, endometrial curetting, endometrial fluid      11/23/2016 Pathology Results    1. Lymph node, sentinel, biopsy, right external iliac - NO CARCINOMA IDENTIFIED IN ONE LYMPH NODE (0/1) - ENDOSALPINGIOSIS 2. Lymph node, sentinel, biopsy, left external iliac - METASTATIC CARCINOMA INVOLVING ONE LYMPH NODE 1/1 - ENDOSALPINGIOSIS 3. Uterus +/- tubes/ovaries, neoplastic, and left ovary, cervix UTERUS:    - ENDOMETRIOID ADENOCARCINOMA, FIGO GRADE 3 OF 3 - LEIOMYOMATA (1.6 CM; LARGEST) - ADENOMYOSIS CERVIX: - NO CARCINOMA IDENTIFIED LEFT OVARY: - NO CARCINOMA IDENTIFIED Microscopic Comment 3. ONCOLOGY TABLE-UTERUS, CARCINOMA OR CARCINOSARCOMA Specimen: Uterus, cervix, left ovary, and sentinel lymph nodes Procedure: Total hysterectomy, left oophorectomy and sentinel lymph node biopsies Maximum tumor size: 6 cm Histologic type: Endometrioid carcinoma, not otherwise  specified Grade: FIGO 3 Myometrial invasion: 1.3 cm where myometrium is 1.9 cm in thickness Cervical stromal involvement: Not identified Extent of involvement of other organs: Not identified Lymph - vascular invasion: Not identified Peritoneal washings: Not submitted/found Lymph nodes: Examined: 2 Sentinel 0 Non-sentinel 2 Total Lymph nodes with metastasis: 1 Isolated tumor cells (< 0.2 mm): 0 Micrometastasis: (> 0.2 mm and < 2.0 mm): 0 Macrometastasis: (> 2.0 mm): 1 Extracapsular extension: Not identified TNM code: pT1b, snpN1a FIGO Stage (based on pathologic findings, needs clinical correlation): IIIC1      11/23/2016 Surgery    Operation: Robotic-assisted laparoscopic total hysterectomy with left salpingoophorectomy,  Sentinel lymph node biopsy.  Surgeon: Quinn Axe  Assistant Surgeon: Antionette Char MD Operative Findings:  : 6cm normal appearing uterus. 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 No suspcious nodes. Adhesions between left ovary and left ovarian fossa.       12/09/2016 PET scan    Recent hysterectomy with small amount of free fluid in pelvis. No evidence of the residual or metastatic carcinoma.  Single 9 mm left level 2A cervical lymph node with low-grade metabolic activity, which is nonspecific but likely reactive in etiology.  Incidentally noted sigmoid diverticulosis and cholelithiasis.      12/23/2016 Procedure    Placement of a subcutaneous port device      12/27/2016 -  Chemotherapy    She received carboplatin and Taxol       INTERVAL HISTORY: Please see below for problem oriented charting. She returns with her husband for further care, and seen  prior to cycle 2 of treatment She shared with me her experience of having difficulties getting insurance to pay for her wig With cycle 1 of treatment, she complained of some mild nausea but not significant She had insomnia Part of her insomnia was due to a flare of  myalgias related to G-CSF support and significant peripheral neuropathy that was exacerbated by chemotherapy She felt better in the last few days prior to coming in Her energy level is fair Her appetite is stable She denies changes in her bowel habits  REVIEW OF SYSTEMS:   Constitutional: Denies fevers, chills or abnormal weight loss Eyes: Denies blurriness of vision Ears, nose, mouth, throat, and face: Denies mucositis or sore throat Respiratory: Denies cough, dyspnea or wheezes Cardiovascular: Denies palpitation, chest discomfort or lower extremity swelling Skin: Denies abnormal skin rashes Lymphatics: Denies new lymphadenopathy or easy bruising Behavioral/Psych: Mood is stable, no new changes  All other systems were reviewed with the patient and are negative.  I have reviewed the past medical history, past surgical history, social history and family history with the patient and they are unchanged from previous note.  ALLERGIES:  has No Known Allergies.  MEDICATIONS:  Current Outpatient Prescriptions  Medication Sig Dispense Refill  . dexamethasone (DECADRON) 4 MG tablet Take 5 tabs the day before chemo and 5 tabs the morning of chemo, every 3 weeks x 6 60 tablet 1  . gabapentin (NEURONTIN) 300 MG capsule Take 1 capsule (300 mg total) by mouth 3 (three) times daily. (Patient not taking: Reported on 01/18/2017) 90 capsule 9  . ibuprofen (ADVIL,MOTRIN) 200 MG tablet Take 600 mg by mouth 2 (two) times daily as needed for moderate pain.    Marland Kitchen levothyroxine (SYNTHROID, LEVOTHROID) 137 MCG tablet TAKE ONE TABLET DAILY IN THE MORNING 1/2 HOUR PRIOR TO FOOD  5  . lidocaine-prilocaine (EMLA) cream Apply to affected area once 30 g 3  . ondansetron (ZOFRAN) 8 MG tablet Take 1 tablet (8 mg total) by mouth every 8 (eight) hours as needed for refractory nausea / vomiting. Start on day 3 after chemo. 30 tablet 1  . OVER THE COUNTER MEDICATION Take 5 tablets by mouth 3 (three) times a week. Pyridoxal  1, 2, 3, and 4 otc supplement  Vitamin package    . OVER THE COUNTER MEDICATION Take 1 tablet by mouth as directed. Sulfur 24 g7 otc supplement take 1 tablet daily 5 days out of the week  Adrenaline gland support    . OVER THE COUNTER MEDICATION Take 1 capsule by mouth See admin instructions. Carbon 90 bx otc supplement take 1 capsule daily 5 days out of the week  Omega 3 supplement    . prochlorperazine (COMPAZINE) 10 MG tablet Take 1 tablet (10 mg total) by mouth every 6 (six) hours as needed (Nausea or vomiting). 30 tablet 1   No current facility-administered medications for this visit.     PHYSICAL EXAMINATION: ECOG PERFORMANCE STATUS: 1 - Symptomatic but completely ambulatory  Vitals:   01/17/17 1023  BP: 128/61  Pulse: 78  Resp: 18  Temp: 97.6 F (36.4 C)  SpO2: 98%   Filed Weights   01/17/17 1023  Weight: 154 lb (69.9 kg)    GENERAL:alert, no distress and comfortable SKIN: skin color, texture, turgor are normal, no rashes or significant lesions EYES: normal, Conjunctiva are pink and non-injected, sclera clear OROPHARYNX:no exudate, no erythema and lips, buccal mucosa, and tongue normal  NECK: supple, thyroid normal size, non-tender, without nodularity LYMPH:  no palpable lymphadenopathy in the cervical, axillary or inguinal LUNGS: clear to auscultation and percussion with normal breathing effort HEART: regular rate & rhythm and no murmurs and no lower extremity edema ABDOMEN:abdomen soft, non-tender and normal bowel sounds Musculoskeletal:no cyanosis of digits and no clubbing  NEURO: alert & oriented x 3 with fluent speech, no focal motor/sensory deficits  LABORATORY DATA:  I have reviewed the data as listed    Component Value Date/Time   NA 140 01/17/2017 0922   K 4.2 01/17/2017 0922   CL 109 12/23/2016 1030   CO2 22 01/17/2017 0922   GLUCOSE 169 (H) 01/17/2017 0922   BUN 25.4 01/17/2017 0922   CREATININE 0.7 01/17/2017 0922   CALCIUM 9.7 01/17/2017 0922    PROT 7.5 01/17/2017 0922   ALBUMIN 4.1 01/17/2017 0922   AST 14 01/17/2017 0922   ALT 19 01/17/2017 0922   ALKPHOS 86 01/17/2017 0922   BILITOT 0.31 01/17/2017 0922   GFRNONAA >60 12/23/2016 1030   GFRAA >60 12/23/2016 1030    No results found for: SPEP, UPEP  Lab Results  Component Value Date   WBC 8.6 01/17/2017   NEUTROABS 7.7 (H) 01/17/2017   HGB 12.0 01/17/2017   HCT 35.2 01/17/2017   MCV 95.5 01/17/2017   PLT 272 01/17/2017      Chemistry      Component Value Date/Time   NA 140 01/17/2017 0922   K 4.2 01/17/2017 0922   CL 109 12/23/2016 1030   CO2 22 01/17/2017 0922   BUN 25.4 01/17/2017 0922   CREATININE 0.7 01/17/2017 0922      Component Value Date/Time   CALCIUM 9.7 01/17/2017 0922   ALKPHOS 86 01/17/2017 0922   AST 14 01/17/2017 0922   ALT 19 01/17/2017 0922   BILITOT 0.31 01/17/2017 0922       RADIOGRAPHIC STUDIES: I have personally reviewed the radiological images as listed and agreed with the findings in the report. Ir US Guide Vasc Access Right  Result Date: 12/23/2016 INDICATION: Endometrioid adenocarcinoma.  Port-A-Cath needed for treatment. EXAM: FLUOROSCOPIC AND ULTRASOUND GUIDED PLACEMENT OF A SUBCUTANEOUS PORT COMPARISON:  None. MEDICATIONS: Ancef 2 g; The antibiotic was administered within an appropriate time interval prior to skin puncture. ANESTHESIA/SEDATION: Versed 2.0 mg IV; Fentanyl 100 mcg IV; Moderate Sedation Time:  35 minutes The patient was continuously monitored during the procedure by the interventional radiology nurse under my direct supervision. FLUOROSCOPY TIME:  18 seconds, 1 mGy COMPLICATIONS: None immediate. PROCEDURE: The procedure, risks, benefits, and alternatives were explained to the patient. Questions regarding the procedure were encouraged and answered. The patient understands and consents to the procedure. Patient was placed supine on the interventional table. Ultrasound confirmed a patent right internal jugular vein. The  right chest and neck were cleaned with a skin antiseptic and a sterile drape was placed. Maximal barrier sterile technique was utilized including caps, mask, sterile gowns, sterile gloves, sterile drape, hand hygiene and skin antiseptic. The right neck was anesthetized with 1% lidocaine. Small incision was made in the right neck with a blade. Micropuncture set was placed in the right internal jugular vein with ultrasound guidance. The micropuncture wire was used for measurement purposes. The right chest was anesthetized with 1% lidocaine with epinephrine. #15 blade was used to make an incision and a subcutaneous port pocket was formed. [place] was assembled. Subcutaneous tunnel was formed with a stiff tunneling device. The port catheter was brought through the subcutaneous tunnel. The port was placed  in the subcutaneous pocket. The micropuncture set was exchanged for a peel-away sheath. The catheter was placed through the peel-away sheath and the tip was positioned at the superior cavoatrial junction. Catheter placement was confirmed with fluoroscopy. The port was accessed and flushed with heparinized saline. The port pocket was closed using two layers of absorbable sutures and Dermabond. The vein skin site was closed using a single layer of absorbable suture and Dermabond. Sterile dressings were applied. Patient tolerated the procedure well without an immediate complication. Ultrasound and fluoroscopic images were taken and saved for this procedure. IMPRESSION: Placement of a subcutaneous port device. Electronically Signed   By: Markus Daft M.D.   On: 12/23/2016 13:20   Ir Fluoro Guide Port Insertion Right  Result Date: 12/23/2016 INDICATION: Endometrioid adenocarcinoma.  Port-A-Cath needed for treatment. EXAM: FLUOROSCOPIC AND ULTRASOUND GUIDED PLACEMENT OF A SUBCUTANEOUS PORT COMPARISON:  None. MEDICATIONS: Ancef 2 g; The antibiotic was administered within an appropriate time interval prior to skin  puncture. ANESTHESIA/SEDATION: Versed 2.0 mg IV; Fentanyl 100 mcg IV; Moderate Sedation Time:  35 minutes The patient was continuously monitored during the procedure by the interventional radiology nurse under my direct supervision. FLUOROSCOPY TIME:  18 seconds, 1 mGy COMPLICATIONS: None immediate. PROCEDURE: The procedure, risks, benefits, and alternatives were explained to the patient. Questions regarding the procedure were encouraged and answered. The patient understands and consents to the procedure. Patient was placed supine on the interventional table. Ultrasound confirmed a patent right internal jugular vein. The right chest and neck were cleaned with a skin antiseptic and a sterile drape was placed. Maximal barrier sterile technique was utilized including caps, mask, sterile gowns, sterile gloves, sterile drape, hand hygiene and skin antiseptic. The right neck was anesthetized with 1% lidocaine. Small incision was made in the right neck with a blade. Micropuncture set was placed in the right internal jugular vein with ultrasound guidance. The micropuncture wire was used for measurement purposes. The right chest was anesthetized with 1% lidocaine with epinephrine. #15 blade was used to make an incision and a subcutaneous port pocket was formed. [place] was assembled. Subcutaneous tunnel was formed with a stiff tunneling device. The port catheter was brought through the subcutaneous tunnel. The port was placed in the subcutaneous pocket. The micropuncture set was exchanged for a peel-away sheath. The catheter was placed through the peel-away sheath and the tip was positioned at the superior cavoatrial junction. Catheter placement was confirmed with fluoroscopy. The port was accessed and flushed with heparinized saline. The port pocket was closed using two layers of absorbable sutures and Dermabond. The vein skin site was closed using a single layer of absorbable suture and Dermabond. Sterile  dressings were applied. Patient tolerated the procedure well without an immediate complication. Ultrasound and fluoroscopic images were taken and saved for this procedure. IMPRESSION: Placement of a subcutaneous port device. Electronically Signed   By: Markus Daft M.D.   On: 12/23/2016 13:20    ASSESSMENT & PLAN:  Endometrial cancer ([place]) She tolerated the treatment reasonably well but did have some mild nausea and a flare of neuropathy I recommend reducing the dose of Taxol and also prescribed gabapentin I will reassess symptom control in her next visit  Peripheral neuropathy due to chemotherapy [facility]) she has mild peripheral neuropathy, likely related to side effects of treatment. I plan to reduce the dose of treatment as outlined above.  I explained to the patient the rationale of this strategy and reassured the patient it would  not compromise the efficacy of treatment I will also start her on gabapentin I warned her about risk of side effects of gabapentin   No orders of the defined types were placed in this encounter.  All questions were answered. The patient knows to call the clinic with any problems, questions or concerns. No barriers to learning was detected. I spent 20 minutes counseling the patient face to face. The total time spent in the appointment was 25 minutes and more than 50% was on counseling and review of test results     Heath Lark, MD 01/18/2017 9:03 AM

## 2017-01-18 NOTE — Assessment & Plan Note (Signed)
she has mild peripheral neuropathy, likely related to side effects of treatment. I plan to reduce the dose of treatment as outlined above.  I explained to the patient the rationale of this strategy and reassured the patient it would not compromise the efficacy of treatment I will also start her on gabapentin I warned her about risk of side effects of gabapentin

## 2017-01-18 NOTE — Progress Notes (Signed)
  Radiation Oncology         [phone]) [phone] ________________________________  Name: [name] MRN: [id number]  Date: 01/18/2017  DOB: November 21, 1950  CC: [name], NP  [name], MD  HDR BRACHYTHERAPY NOTE  DIAGNOSIS: Endometrial adenocarcinoma, FIGO grade III, stage pT1b,snpN1a    Simple treatment device note: Patient had construction of her custom vaginal cylinder. She will be treated with a 2.5 cm diameter segmented cylinder. This conforms to her anatomy without undue discomfort.  Vaginal brachytherapy procedure node: The patient was brought to the [place] suite. Identity was confirmed. All relevant records and images related to the planned course of therapy were reviewed. The patient freely provided informed written consent to proceed with treatment after reviewing the details related to the planned course of therapy. The consent form was witnessed and verified by the simulation staff. Then, the patient was set-up in a stable reproducible supine position for radiation therapy. Pelvic exam revealed the vaginal cuff to be intact . The patient's custom vaginal cylinder was placed in the proximal vagina. This was affixed to the CT/MR stabilization plate to prevent slippage. Patient tolerated the placement well.  Verification simulation note:  A fiducial marker was placed within the vaginal cylinder. An AP and lateral film was then obtained through the pelvis area. This documented accurate position of the vaginal cylinder for treatment.  HDR BRACHYTHERAPY TREATMENT  The remote afterloading device was affixed to the vaginal cylinder by catheter. Patient then proceeded to undergo her first high-dose-rate treatment directed at the proximal vagina. The patient was prescribed a dose of 6.0 gray to be delivered to the mucosal surface. Treatment length was 3.5 cm. Patient was treated with 1 channel using 8 dwell positions. Treatment time was 174.6 seconds. Iridium 192 was the high-dose-rate source for  treatment. The patient tolerated the treatment well. After completion of her therapy, a radiation survey was performed documenting return of the iridium source into the GammaMed safe.   PLAN: patient will return next week for her second high-dose-rate treatment ________________________________  Blair Promise, PhD, MD

## 2017-01-19 ENCOUNTER — Ambulatory Visit: Payer: Medicare Other

## 2017-01-26 ENCOUNTER — Telehealth: Payer: Self-pay | Admitting: *Deleted

## 2017-01-26 ENCOUNTER — Ambulatory Visit: Payer: Medicare Other | Admitting: Radiation Oncology

## 2017-01-26 NOTE — Telephone Encounter (Signed)
Called patient to remind of HDR Tx. for 01-27-17 @ 11 am, lvm for a return call

## 2017-01-27 ENCOUNTER — Ambulatory Visit
Admission: RE | Admit: 2017-01-27 | Discharge: 2017-01-27 | Disposition: A | Discharge status: Home / Self Care | Payer: Medicare Other | Source: Ambulatory Visit | Attending: Radiation Oncology | Admitting: Radiation Oncology

## 2017-01-27 DIAGNOSIS — C541 Malignant neoplasm of endometrium: Secondary | ICD-10-CM

## 2017-01-27 DIAGNOSIS — Z510 Encounter for antineoplastic radiation therapy: Secondary | ICD-10-CM | POA: Diagnosis not present

## 2017-01-27 NOTE — Progress Notes (Signed)
  Radiation Oncology         [phone]) [phone] ________________________________  Name: [name] MRN: [id number]  Date: 01/27/2017  DOB: [birth year]  CC: [name], NP  [name], MD  HDR BRACHYTHERAPY NOTE  DIAGNOSIS: Endometrial adenocarcinoma, FIGO grade III, stage pT1b,snpN1a     Simple treatment device note: Patient had construction of her custom vaginal cylinder. She will be treated with a 2.5 cm diameter segmented cylinder. This conforms to her anatomy without undue discomfort.  Vaginal brachytherapy procedure node: The patient was brought to the [place] suite. Identity was confirmed. All relevant records and images related to the planned course of therapy were reviewed. The patient freely provided informed written consent to proceed with treatment after reviewing the details related to the planned course of therapy. The consent form was witnessed and verified by the simulation staff. Then, the patient was set-up in a stable reproducible supine position for radiation therapy. Pelvic exam revealed the vaginal cuff to be intact . The patient's custom vaginal cylinder was placed in the proximal vagina. This was affixed to the CT/MR stabilization plate to prevent slippage. Patient tolerated the placement well.  Verification simulation note:  A fiducial marker was placed within the vaginal cylinder. An AP and lateral film was then obtained through the pelvis area. This documented accurate position of the vaginal cylinder for treatment.  HDR BRACHYTHERAPY TREATMENT  The remote afterloading device was affixed to the vaginal cylinder by catheter. Patient then proceeded to undergo her second high-dose-rate treatment directed at the proximal vagina. The patient was prescribed a dose of 6 gray to be delivered to the mucosal surface. Treatment length was 3.5 cm. Patient was treated with 1 channel using 8 dwell positions. Treatment time was 190.0 seconds. Iridium 192 was the high-dose-rate source for  treatment. The patient tolerated the treatment well. After completion of her therapy, a radiation survey was performed documenting return of the iridium source into the GammaMed safe.   PLAN: Patient will return in 2 weeks for third high-dose-rate treatment. She will be out of town next week ________________________________  Blair Promise, PhD, MD

## 2017-02-02 ENCOUNTER — Ambulatory Visit: Payer: Medicare Other | Admitting: Radiation Oncology

## 2017-02-07 ENCOUNTER — Encounter: Payer: Self-pay | Admitting: Hematology and Oncology

## 2017-02-07 ENCOUNTER — Telehealth: Payer: Self-pay | Admitting: Hematology and Oncology

## 2017-02-07 ENCOUNTER — Ambulatory Visit (HOSPITAL_BASED_OUTPATIENT_CLINIC_OR_DEPARTMENT_OTHER): Payer: Medicare Other

## 2017-02-07 ENCOUNTER — Ambulatory Visit (HOSPITAL_BASED_OUTPATIENT_CLINIC_OR_DEPARTMENT_OTHER): Payer: Medicare Other | Admitting: Hematology and Oncology

## 2017-02-07 ENCOUNTER — Ambulatory Visit: Payer: Medicare Other

## 2017-02-07 ENCOUNTER — Other Ambulatory Visit (HOSPITAL_BASED_OUTPATIENT_CLINIC_OR_DEPARTMENT_OTHER): Payer: Medicare Other

## 2017-02-07 DIAGNOSIS — C541 Malignant neoplasm of endometrium: Secondary | ICD-10-CM

## 2017-02-07 DIAGNOSIS — Z5189 Encounter for other specified aftercare: Secondary | ICD-10-CM

## 2017-02-07 DIAGNOSIS — Z5111 Encounter for antineoplastic chemotherapy: Secondary | ICD-10-CM | POA: Diagnosis not present

## 2017-02-07 DIAGNOSIS — G620 Drug-induced polyneuropathy: Secondary | ICD-10-CM | POA: Diagnosis not present

## 2017-02-07 DIAGNOSIS — R110 Nausea: Secondary | ICD-10-CM

## 2017-02-07 DIAGNOSIS — Z95828 Presence of other vascular implants and grafts: Secondary | ICD-10-CM

## 2017-02-07 DIAGNOSIS — T451X5A Adverse effect of antineoplastic and immunosuppressive drugs, initial encounter: Secondary | ICD-10-CM

## 2017-02-07 LAB — COMPREHENSIVE METABOLIC PANEL
ALT: 17 U/L (ref 0–55)
AST: 19 U/L (ref 5–34)
Albumin: 3.9 g/dL (ref 3.5–5.0)
Alkaline Phosphatase: 87 U/L (ref 40–150)
Anion Gap: 6 mEq/L (ref 3–11)
BUN: 23.2 mg/dL (ref 7.0–26.0)
CO2: 23 mEq/L (ref 22–29)
Calcium: 8.8 mg/dL (ref 8.4–10.4)
Chloride: 109 mEq/L (ref 98–109)
Creatinine: 0.7 mg/dL (ref 0.6–1.1)
EGFR: >60 mL/min/{1.73_m2} (ref >60)
Glucose: 104 mg/dl (ref 70–140)
Potassium: 4.1 mEq/L (ref 3.5–5.1)
Sodium: 139 mEq/L (ref 136–145)
Total Bilirubin: 0.37 mg/dL (ref 0.20–1.20)
Total Protein: 6.9 g/dL (ref 6.4–8.3)

## 2017-02-07 LAB — CBC WITH DIFFERENTIAL/PLATELET
BASO%: 0.4 % (ref 0.0–2.0)
Basophils Absolute: 0 10*3/uL (ref 0.0–0.1)
EOS%: 0.4 % (ref 0.0–7.0)
Eosinophils Absolute: 0 10*3/uL (ref 0.0–0.5)
HCT: 34 % — ABNORMAL LOW (ref 34.8–46.6)
HGB: 11.2 g/dL — ABNORMAL LOW (ref 11.6–15.9)
LYMPH%: 13 % — ABNORMAL LOW (ref 14.0–49.7)
MCH: 32.7 pg (ref 25.1–34.0)
MCHC: 32.9 g/dL (ref 31.5–36.0)
MCV: 99.1 fL (ref 79.5–101.0)
MONO#: 0.1 10*3/uL (ref 0.1–0.9)
MONO%: 1.7 % (ref 0.0–14.0)
NEUT#: 4.5 10*3/uL (ref 1.5–6.5)
NEUT%: 84.5 % — ABNORMAL HIGH (ref 38.4–76.8)
Platelets: 159 10*3/uL (ref 145–400)
RBC: 3.43 10*6/uL — ABNORMAL LOW (ref 3.70–5.45)
RDW: 15 % — ABNORMAL HIGH (ref 11.2–14.5)
WBC: 5.3 10*3/uL (ref 3.9–10.3)
lymph#: 0.7 10*3/uL — ABNORMAL LOW (ref 0.9–3.3)

## 2017-02-07 MED ORDER — SODIUM CHLORIDE 0.9% FLUSH
10.0000 mL | INTRAVENOUS | Status: DC | PRN
  Administered 2017-02-07: 10 mL
  Filled 2017-02-07: qty 10

## 2017-02-07 MED ORDER — HEPARIN SOD (PORK) LOCK FLUSH 100 UNIT/ML IV SOLN
500.0000 [IU] | Freq: Once | INTRAVENOUS | Status: AC | PRN
  Administered 2017-02-07: 500 [IU]
  Filled 2017-02-07: qty 5

## 2017-02-07 MED ORDER — PALONOSETRON HCL INJECTION 0.25 MG/5ML
0.2500 mg | Freq: Once | INTRAVENOUS | Status: AC
  Administered 2017-02-07: 0.25 mg via INTRAVENOUS

## 2017-02-07 MED ORDER — SODIUM CHLORIDE 0.9 % IV SOLN
Freq: Once | INTRAVENOUS | Status: AC
  Administered 2017-02-07: 11:00:00 via INTRAVENOUS

## 2017-02-07 MED ORDER — DIPHENHYDRAMINE HCL 50 MG/ML IJ SOLN
50.0000 mg | Freq: Once | INTRAMUSCULAR | Status: AC
  Administered 2017-02-07: 50 mg via INTRAVENOUS

## 2017-02-07 MED ORDER — SODIUM CHLORIDE 0.9 % IV SOLN
140.0000 mg/m2 | Freq: Once | INTRAVENOUS | Status: AC
  Administered 2017-02-07: 264 mg via INTRAVENOUS
  Filled 2017-02-07: qty 44

## 2017-02-07 MED ORDER — DIPHENHYDRAMINE HCL 50 MG/ML IJ SOLN
INTRAMUSCULAR | Status: AC
  Filled 2017-02-07: qty 1

## 2017-02-07 MED ORDER — FAMOTIDINE IN NACL 20-0.9 MG/50ML-% IV SOLN
INTRAVENOUS | Status: AC
  Filled 2017-02-07: qty 50

## 2017-02-07 MED ORDER — PEGFILGRASTIM 6 MG/0.6ML ~~LOC~~ PSKT
6.0000 mg | PREFILLED_SYRINGE | Freq: Once | SUBCUTANEOUS | Status: AC
  Administered 2017-02-07: 6 mg via SUBCUTANEOUS
  Filled 2017-02-07: qty 0.6

## 2017-02-07 MED ORDER — SODIUM CHLORIDE 0.9 % IV SOLN
530.0000 mg | Freq: Once | INTRAVENOUS | Status: AC
  Administered 2017-02-07: 530 mg via INTRAVENOUS
  Filled 2017-02-07: qty 53

## 2017-02-07 MED ORDER — PALONOSETRON HCL INJECTION 0.25 MG/5ML
INTRAVENOUS | Status: AC
  Filled 2017-02-07: qty 5

## 2017-02-07 MED ORDER — SODIUM CHLORIDE 0.9% FLUSH
10.0000 mL | INTRAVENOUS | Status: DC | PRN
  Administered 2017-02-07: 10 mL via INTRAVENOUS
  Filled 2017-02-07: qty 10

## 2017-02-07 MED ORDER — SODIUM CHLORIDE 0.9 % IV SOLN
Freq: Once | INTRAVENOUS | Status: AC
  Administered 2017-02-07: 12:00:00 via INTRAVENOUS
  Filled 2017-02-07: qty 5

## 2017-02-07 MED ORDER — FAMOTIDINE IN NACL 20-0.9 MG/50ML-% IV SOLN
20.0000 mg | Freq: Once | INTRAVENOUS | Status: AC
  Administered 2017-02-07: 20 mg via INTRAVENOUS

## 2017-02-07 NOTE — Assessment & Plan Note (Signed)
She tolerated the treatment reasonably well but did have some mild nausea and a flare of neuropathy Her symptoms are much improved with reduced dose Taxol and gabapentin We will continue similar dose without further dose adjustment

## 2017-02-07 NOTE — Telephone Encounter (Signed)
Gave avs and calendar for December  °

## 2017-02-07 NOTE — Assessment & Plan Note (Signed)
The patient had pre-existing peripheral neuropathy even before chemotherapy started With her recent treatment, the dose was reduced and she tolerated that better She has met with her complementary medication physician who has recommended addition of vitamin B6 and other supplements which are acceptable to take She has read on newspaper about a special device that will pump her lower extremity calf to deliver extra blood supply to lower extremities She has tried recently and felt that it was helpful but the device cost several thousand dollars.   She is requesting me to make phone calls to get her the device at special discount rate, if possible I do not recommend her to use a device because physiologically, delivering extra blood supply to her peripheral nerves would not help with improve sensation or peripheral neuropathy If she is interested, I can refer her to neurologist for further management and she will think about it

## 2017-02-07 NOTE — Assessment & Plan Note (Signed)
She has intermittent chemotherapy-induced nausea I recommend she takes antiemetics on the regular basis a few days after each treatment

## 2017-02-07 NOTE — Progress Notes (Signed)
[place] OFFICE PROGRESS NOTE  Patient Care Team: [name], NP as PCP - General (Obstetrics and Gynecology)  SUMMARY OF ONCOLOGIC HISTORY: Oncology History   MSI stable     Endometrial cancer ([place])   10/15/2016 Pathology Results    Endometrium, resection - ENDOMETRIOID ADENOCARCINOMA. - SEE COMMENT. Microscopic Comment The carcinoma appears FIGO grade II. There are foci of adenocarcinoma involving smooth muscle.       10/15/2016 Surgery    Preoperative diagnosis: Postmenopausal bleeding, endometrial mass/ thick endometrial stripe.                                       Cervical stenosis    Postop diagnosis: Same.   Procedure: Hysteroscopic resection of endometrial mass with Myosure Lite and dilatation and curettage Anesthesia General  Surgeon: [name], MD  IV fluids: LR 1 liter Estimated blood loss : 100 cc  Saline Fluid deficit: 1060 cc  Urine output: straight catheter preop 25 cc  Complications none  Condition stable  Disposition PACU  Specimen: Endometrial mass resected tissue, endometrial curetting, endometrial fluid      11/23/2016 Pathology Results    1. Lymph node, sentinel, biopsy, right external iliac - NO CARCINOMA IDENTIFIED IN ONE LYMPH NODE (0/1) - ENDOSALPINGIOSIS 2. Lymph node, sentinel, biopsy, left external iliac - METASTATIC CARCINOMA INVOLVING ONE LYMPH NODE 1/1 - ENDOSALPINGIOSIS 3. Uterus +/- tubes/ovaries, neoplastic, and left ovary, cervix UTERUS:    - ENDOMETRIOID ADENOCARCINOMA, FIGO GRADE 3 OF 3 - LEIOMYOMATA (1.6 CM; LARGEST) - ADENOMYOSIS CERVIX: - NO CARCINOMA IDENTIFIED LEFT OVARY: - NO CARCINOMA IDENTIFIED Microscopic Comment 3. ONCOLOGY TABLE-UTERUS, CARCINOMA OR CARCINOSARCOMA Specimen: Uterus, cervix, left ovary, and sentinel lymph nodes Procedure: Total hysterectomy, left oophorectomy and sentinel lymph node biopsies Maximum tumor size: 6 cm Histologic type: Endometrioid carcinoma, not otherwise  specified Grade: FIGO 3 Myometrial invasion: 1.3 cm where myometrium is 1.9 cm in thickness Cervical stromal involvement: Not identified Extent of involvement of other organs: Not identified Lymph - vascular invasion: Not identified Peritoneal washings: Not submitted/found Lymph nodes: Examined: 2 Sentinel 0 Non-sentinel 2 Total Lymph nodes with metastasis: 1 Isolated tumor cells (< 0.2 mm): 0 Micrometastasis: (> 0.2 mm and < 2.0 mm): 0 Macrometastasis: (> 2.0 mm): 1 Extracapsular extension: Not identified TNM code: pT1b, snpN1a FIGO Stage (based on pathologic findings, needs clinical correlation): IIIC1      11/23/2016 Surgery    Operation: Robotic-assisted laparoscopic total hysterectomy with left salpingoophorectomy,  Sentinel lymph node biopsy.  Surgeon: [name]  Assistant Surgeon: Lahoma Crocker MD Operative Findings:  : 6cm normal appearing uterus. 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 No suspcious nodes. Adhesions between left ovary and left ovarian fossa.       12/09/2016 PET scan    Recent hysterectomy with small amount of free fluid in pelvis. No evidence of the residual or metastatic carcinoma.  Single 9 mm left level 2A cervical lymph node with low-grade metabolic activity, which is nonspecific but likely reactive in etiology.  Incidentally noted sigmoid diverticulosis and cholelithiasis.      12/23/2016 Procedure    Placement of a subcutaneous port device      12/27/2016 -  Chemotherapy    She received carboplatin and Taxol       INTERVAL HISTORY: Please see below for problem oriented charting. She returns prior to cycle 3 of chemotherapy She has  intermittent nausea recently without vomiting She has been taking antiemetics as needed She also complained of peripheral neuropathy slightly better with reduced dose Taxol She was prescribed additional vitamin B6 supplement and would like to get a device that would  pump blood to her lower legs.  She felt that the improve pumping of blood has reduced her sensation of peripheral neuropathy She denies constipation.  Her energy level is fair  REVIEW OF SYSTEMS:   Constitutional: Denies fevers, chills or abnormal weight loss Eyes: Denies blurriness of vision Ears, nose, mouth, throat, and face: Denies mucositis or sore throat Respiratory: Denies cough, dyspnea or wheezes Cardiovascular: Denies palpitation, chest discomfort or lower extremity swelling Skin: Denies abnormal skin rashes Lymphatics: Denies new lymphadenopathy or easy bruising Neurological:Denies numbness, tingling or new weaknesses Behavioral/Psych: Mood is stable, no new changes  All other systems were reviewed with the patient and are negative.  I have reviewed the past medical history, past surgical history, social history and family history with the patient and they are unchanged from previous note.  ALLERGIES:  has No Known Allergies.  MEDICATIONS:  Current Outpatient Medications  Medication Sig Dispense Refill  . dexamethasone (DECADRON) 4 MG tablet Take 5 tabs the day before chemo and 5 tabs the morning of chemo, every 3 weeks x 6 60 tablet 1  . gabapentin (NEURONTIN) 300 MG capsule Take 1 capsule (300 mg total) by mouth 3 (three) times daily. (Patient not taking: Reported on 01/18/2017) 90 capsule 9  . ibuprofen (ADVIL,MOTRIN) 200 MG tablet Take 600 mg by mouth 2 (two) times daily as needed for moderate pain.    Marland Kitchen levothyroxine (SYNTHROID, LEVOTHROID) 137 MCG tablet TAKE ONE TABLET DAILY IN THE MORNING 1/2 HOUR PRIOR TO FOOD  5  . lidocaine-prilocaine (EMLA) cream Apply to affected area once 30 g 3  . ondansetron (ZOFRAN) 8 MG tablet Take 1 tablet (8 mg total) by mouth every 8 (eight) hours as needed for refractory nausea / vomiting. Start on day 3 after chemo. 30 tablet 1  . OVER THE COUNTER MEDICATION Take 5 tablets by mouth 3 (three) times a week. Pyridoxal 1, 2, 3, and 4 otc  supplement  Vitamin package    . OVER THE COUNTER MEDICATION Take 1 tablet by mouth as directed. Sulfur 24 g7 otc supplement take 1 tablet daily 5 days out of the week  Adrenaline gland support    . OVER THE COUNTER MEDICATION Take 1 capsule by mouth See admin instructions. Carbon 90 bx otc supplement take 1 capsule daily 5 days out of the week  Omega 3 supplement    . prochlorperazine (COMPAZINE) 10 MG tablet Take 1 tablet (10 mg total) by mouth every 6 (six) hours as needed (Nausea or vomiting). 30 tablet 1   No current facility-administered medications for this visit.    Facility-Administered Medications Ordered in Other Visits  Medication Dose Route Frequency Provider Last Rate Last Dose  . CARBOplatin (PARAPLATIN) 530 mg in sodium chloride 0.9 % 250 mL chemo infusion  530 mg Intravenous Once Alvy Bimler, Deven Audi, MD      . heparin lock flush 100 unit/mL  500 Units Intracatheter Once PRN Alvy Bimler, Damoni Causby, MD      . PACLitaxel (TAXOL) 264 mg in sodium chloride 0.9 % 250 mL chemo infusion (> 57m/m2)  140 mg/m2 (Treatment Plan Recorded) Intravenous Once GHeath Lark MD 98 mL/hr at 02/07/17 1311 264 mg at 02/07/17 1311  . pegfilgrastim (NEULASTA ONPRO KIT) injection 6 mg  6 mg Subcutaneous  Once Heath Lark, MD      . sodium chloride flush (NS) 0.9 % injection 10 mL  10 mL Intracatheter PRN Alvy Bimler, Kass Herberger, MD        PHYSICAL EXAMINATION: ECOG PERFORMANCE STATUS: 1 - Symptomatic but completely ambulatory  Vitals:   02/07/17 1016  BP: (!) 107/52  Pulse: 81  Resp: 18  Temp: 97.8 F (36.6 C)  SpO2: 97%   Filed Weights   02/07/17 1016  Weight: 156 lb 12.8 oz (71.1 kg)    GENERAL:alert, no distress and comfortable SKIN: skin color, texture, turgor are normal, no rashes or significant lesions EYES: normal, Conjunctiva are pink and non-injected, sclera clear OROPHARYNX:no exudate, no erythema and lips, buccal mucosa, and tongue normal  NECK: supple, thyroid normal size, non-tender, without  nodularity LYMPH:  no palpable lymphadenopathy in the cervical, axillary or inguinal LUNGS: clear to auscultation and percussion with normal breathing effort HEART: regular rate & rhythm and no murmurs and no lower extremity edema ABDOMEN:abdomen soft, non-tender and normal bowel sounds Musculoskeletal:no cyanosis of digits and no clubbing  NEURO: alert & oriented x 3 with fluent speech, no focal motor/sensory deficits  LABORATORY DATA:  I have reviewed the data as listed    Component Value Date/Time   NA 139 02/07/2017 0949   K 4.1 02/07/2017 0949   CL 109 12/23/2016 1030   CO2 23 02/07/2017 0949   GLUCOSE 104 02/07/2017 0949   BUN 23.2 02/07/2017 0949   CREATININE 0.7 02/07/2017 0949   CALCIUM 8.8 02/07/2017 0949   PROT 6.9 02/07/2017 0949   ALBUMIN 3.9 02/07/2017 0949   AST 19 02/07/2017 0949   ALT 17 02/07/2017 0949   ALKPHOS 87 02/07/2017 0949   BILITOT 0.37 02/07/2017 0949   GFRNONAA >60 12/23/2016 1030   GFRAA >60 12/23/2016 1030    No results found for: SPEP, UPEP  Lab Results  Component Value Date   WBC 5.3 02/07/2017   NEUTROABS 4.5 02/07/2017   HGB 11.2 (L) 02/07/2017   HCT 34.0 (L) 02/07/2017   MCV 99.1 02/07/2017   PLT 159 02/07/2017      Chemistry      Component Value Date/Time   NA 139 02/07/2017 0949   K 4.1 02/07/2017 0949   CL 109 12/23/2016 1030   CO2 23 02/07/2017 0949   BUN 23.2 02/07/2017 0949   CREATININE 0.7 02/07/2017 0949      Component Value Date/Time   CALCIUM 8.8 02/07/2017 0949   ALKPHOS 87 02/07/2017 0949   AST 19 02/07/2017 0949   ALT 17 02/07/2017 0949   BILITOT 0.37 02/07/2017 0949       ASSESSMENT & PLAN:  Endometrial cancer ([place]) She tolerated the treatment reasonably well but did have some mild nausea and a flare of neuropathy Her symptoms are much improved with reduced dose Taxol and gabapentin We will continue similar dose without further dose adjustment  Peripheral neuropathy due to chemotherapy [facility]) The  patient had pre-existing peripheral neuropathy even before chemotherapy started With her recent treatment, the dose was reduced and she tolerated that better She has met with her complementary medication physician who has recommended addition of vitamin B6 and other supplements which are acceptable to take She has read on newspaper about a special device that will pump her lower extremity calf to deliver extra blood supply to lower extremities She has tried recently and felt that it was helpful but the device cost several thousand dollars.   She is requesting me to make  phone calls to get her the device at special discount rate, if possible I do not recommend her to use a device because physiologically, delivering extra blood supply to her peripheral nerves would not help with improve sensation or peripheral neuropathy If she is interested, I can refer her to neurologist for further management and she will think about it  Chemotherapy-induced nausea She has intermittent chemotherapy-induced nausea I recommend she takes antiemetics on the regular basis a few days after each treatment   No orders of the defined types were placed in this encounter.  All questions were answered. The patient knows to call the clinic with any problems, questions or concerns. No barriers to learning was detected. I spent 15 minutes counseling the patient face to face. The total time spent in the appointment was 20 minutes and more than 50% was on counseling and review of test results     Heath Lark, MD 02/07/2017 3:45 PM

## 2017-02-07 NOTE — Patient Instructions (Addendum)
[facility] Discharge Instructions for Patients Receiving Chemotherapy  Today you received the following chemotherapy agents: Carboplatin (Paraplatin) and Paclitaxel (Taxol).  To help prevent nausea and vomiting after your treatment, we encourage you to take your nausea medication as prescribed.   If you develop nausea and vomiting that is not controlled by your nausea medication, call the clinic.   BELOW ARE SYMPTOMS THAT SHOULD BE REPORTED IMMEDIATELY:  *FEVER GREATER THAN 100.5 F  *CHILLS WITH OR WITHOUT FEVER  NAUSEA AND VOMITING THAT IS NOT CONTROLLED WITH YOUR NAUSEA MEDICATION  *UNUSUAL SHORTNESS OF BREATH  *UNUSUAL BRUISING OR BLEEDING  TENDERNESS IN MOUTH AND THROAT WITH OR WITHOUT PRESENCE OF ULCERS  *URINARY PROBLEMS  *BOWEL PROBLEMS  UNUSUAL RASH Items with * indicate a potential emergency and should be followed up as soon as possible.  Feel free to call the clinic should you have any questions or concerns. The clinic phone number is [phone].  Please show the Story at check-in to the Emergency Department and triage nurse.

## 2017-02-08 ENCOUNTER — Telehealth: Payer: Self-pay | Admitting: *Deleted

## 2017-02-08 NOTE — Telephone Encounter (Signed)
CALLED PATIENT TO REMIND OF HDR TX. @ 12 PM ON 02-09-17, SPOKE WITH PATIENT AND SHE IS AWARE OF THIS [place].

## 2017-02-09 ENCOUNTER — Ambulatory Visit
Admission: RE | Admit: 2017-02-09 | Discharge: 2017-02-09 | Disposition: A | Discharge status: Home / Self Care | Payer: Medicare Other | Source: Ambulatory Visit | Attending: Radiation Oncology | Admitting: Radiation Oncology

## 2017-02-09 DIAGNOSIS — C541 Malignant neoplasm of endometrium: Secondary | ICD-10-CM

## 2017-02-09 DIAGNOSIS — Z510 Encounter for antineoplastic radiation therapy: Secondary | ICD-10-CM | POA: Diagnosis not present

## 2017-02-09 NOTE — Progress Notes (Signed)
  Radiation Oncology         [phone] ________________________________  Name: [name] MRN: [id number]  Date: 02/09/2017  DOB: 1950/11/21  CC: [name], NP  [name], MD  HDR BRACHYTHERAPY NOTE  DIAGNOSIS: Endometrial adenocarcinoma, FIGO grade III, stage pT1b,snpN1a     Simple treatment device note: Patient had construction of her custom vaginal cylinder. She will be treated with a 2.5 cm diameter segmented cylinder. This conforms to her anatomy without undue discomfort.  Vaginal brachytherapy procedure node: The patient was brought to the [place] suite. Identity was confirmed. All relevant records and images related to the planned course of therapy were reviewed. The patient freely provided informed written consent to proceed with treatment after reviewing the details related to the planned course of therapy. The consent form was witnessed and verified by the simulation staff. Then, the patient was set-up in a stable reproducible supine position for radiation therapy. Pelvic exam revealed the vaginal cuff to be intact . The patient's custom vaginal cylinder was placed in the proximal vagina. This was affixed to the CT/MR stabilization plate to prevent slippage. Patient tolerated the placement well.  Verification simulation note:  A fiducial marker was placed within the vaginal cylinder. An AP and lateral film was then obtained through the pelvis area. This documented accurate position of the vaginal cylinder for treatment.  HDR BRACHYTHERAPY TREATMENT  The remote afterloading device was affixed to the vaginal cylinder by catheter. Patient then proceeded to undergo her third high-dose-rate treatment directed at the proximal vagina. The patient was prescribed a dose of 6 gray to be delivered to the mucosal surface. Treatment length was 3.5 cm. Patient was treated with 1 channel using 8 dwell positions. Treatment time was 214.70 seconds. Iridium 192 was the high-dose-rate source  for treatment. The patient tolerated the treatment well. After completion of her therapy, a radiation survey was performed documenting return of the iridium source into the GammaMed safe.   PLAN: Patient will return in 1 week for high-dose-rate treatment.  _______________________________    Blair Promise, PhD, MD   This document serves as a record of services personally performed by Gery Pray, MD. It was created on his behalf by [facility], a trained medical scribe. The creation of this record is based on the scribe's personal observations and the provider's statements to them. This document has been checked and approved by the attending provider.

## 2017-02-15 ENCOUNTER — Telehealth: Payer: Self-pay | Admitting: *Deleted

## 2017-02-15 NOTE — Telephone Encounter (Signed)
Called patient to remind of HDR Tx. for 02-16-17 @ 11 am, lvm for a return call

## 2017-02-16 ENCOUNTER — Ambulatory Visit: Admission: RE | Admit: 2017-02-16 | Payer: Medicare Other | Source: Ambulatory Visit

## 2017-02-16 ENCOUNTER — Encounter: Payer: Self-pay | Admitting: Radiation Oncology

## 2017-02-16 ENCOUNTER — Inpatient Hospital Stay: Admission: RE | Admit: 2017-02-16 | Payer: Medicare Other | Source: Ambulatory Visit

## 2017-02-16 ENCOUNTER — Ambulatory Visit
Admission: RE | Admit: 2017-02-16 | Discharge: 2017-02-16 | Disposition: A | Discharge status: Home / Self Care | Payer: Medicare Other | Source: Ambulatory Visit | Attending: Radiation Oncology | Admitting: Radiation Oncology

## 2017-02-16 DIAGNOSIS — Z510 Encounter for antineoplastic radiation therapy: Secondary | ICD-10-CM | POA: Diagnosis not present

## 2017-02-16 DIAGNOSIS — C541 Malignant neoplasm of endometrium: Secondary | ICD-10-CM

## 2017-02-16 NOTE — Progress Notes (Incomplete)
°  Radiation Oncology         [phone] ________________________________  Name: [name] MRN: [id number]  Date: 02/16/2017  DOB: [birth year]  CC: [name], NP  No ref. provider found  HDR BRACHYTHERAPY NOTE  DIAGNOSIS: Endometrial adenocarcinoma, FIGO grade III, stage pT1b,snpN1a     Simple treatment device note: Patient had construction of her custom vaginal cylinder. She will be treated with a 2.5 cm diameter segmented cylinder. This conforms to her anatomy without undue discomfort.  Vaginal brachytherapy procedure node: The patient was brought to the [place] suite. Identity was confirmed. All relevant records and images related to the planned course of therapy were reviewed. The patient freely provided informed written consent to proceed with treatment after reviewing the details related to the planned course of therapy. The consent form was witnessed and verified by the simulation staff. Then, the patient was set-up in a stable reproducible supine position for radiation therapy. Pelvic exam revealed the vaginal cuff to be intact . The patient's custom vaginal cylinder was placed in the proximal vagina. This was affixed to the CT/MR stabilization plate to prevent slippage. Patient tolerated the placement well.  Verification simulation note:  A fiducial marker was placed within the vaginal cylinder. An AP and lateral film was then obtained through the pelvis area. This documented accurate position of the vaginal cylinder for treatment.  HDR BRACHYTHERAPY TREATMENT  The remote afterloading device was affixed to the vaginal cylinder by catheter. Patient then proceeded to undergo her fourth high-dose-rate treatment directed at the proximal vagina. The patient was prescribed a dose of 6 gray to be delivered to the mucosal surface. Treatment length was 3.5 cm. Patient was treated with 1 channel using 8 dwell positions. Treatment time was 229.2 seconds. Iridium 192 was the high-dose-rate  source for treatment. The patient tolerated the treatment well. After completion of her therapy, a radiation survey was performed documenting return of the iridium source into the GammaMed safe.   PLAN: Patient will return in 1 week for high-dose-rate treatment.  _______________________________    Blair Promise, PhD, MD   This document serves as a record of services personally performed by Gery Pray, MD. It was created on his behalf by Marlowe Kays, a trained medical scribe. The creation of this record is based on the scribe's personal observations and the provider's statements to them. This document has been checked and approved by the attending provider.

## 2017-02-16 NOTE — Progress Notes (Signed)
  Radiation Oncology         [phone] ________________________________  Name: [name] MRN: [id number]  Date: 02/16/2017  DOB: 19-Apr-1950  CC: [name], NP  [name], NP  HDR BRACHYTHERAPY NOTE  DIAGNOSIS: Endometrial adenocarcinoma, FIGO grade III, stage pT1b,snpN1a    Simple treatment device note: Patient had construction of her custom vaginal cylinder. She will be treated with a 2.5 cm diameter segmented cylinder. This conforms to her anatomy without undue discomfort.  Vaginal brachytherapy procedure node: The patient was brought to the [place] suite. Identity was confirmed. All relevant records and images related to the planned course of therapy were reviewed. The patient freely provided informed written consent to proceed with treatment after reviewing the details related to the planned course of therapy. The consent form was witnessed and verified by the simulation staff. Then, the patient was set-up in a stable reproducible supine position for radiation therapy. Pelvic exam revealed the vaginal cuff to be intact . The patient's custom vaginal cylinder was placed in the proximal vagina. This was affixed to the CT/MR stabilization plate to prevent slippage. Patient tolerated the placement well.  Verification simulation note:  A fiducial marker was placed within the vaginal cylinder. An AP and lateral film was then obtained through the pelvis area. This documented accurate position of the vaginal cylinder for treatment.  HDR BRACHYTHERAPY TREATMENT  The remote afterloading device was affixed to the vaginal cylinder by catheter. Patient then proceeded to undergo her fourth high-dose-rate treatment directed at the proximal vagina. The patient was prescribed a dose of 6 gray to be delivered to the mucosal surface. Treatment length was 3.5 cm. Patient was treated with 1 channel using 8 dwell positions. Treatment time was 229.2 seconds. Iridium 192 was the high-dose-rate  source for treatment. The patient tolerated the treatment well. After completion of her therapy, a radiation survey was performed documenting return of the iridium source into the GammaMed safe.   PLAN: The patient will return next week for her fifth and final high-dose-rate treatment. ________________________________  Blair Promise, PhD, MD

## 2017-02-22 ENCOUNTER — Telehealth: Payer: Self-pay | Admitting: *Deleted

## 2017-02-22 NOTE — Telephone Encounter (Signed)
Called patient to remind of [place]. For 02-23-17 @ 1 pm, lvm for a return call

## 2017-02-23 ENCOUNTER — Ambulatory Visit
Admission: RE | Admit: 2017-02-23 | Discharge: 2017-02-23 | Disposition: A | Discharge status: Home / Self Care | Payer: Medicare Other | Source: Ambulatory Visit | Attending: Radiation Oncology | Admitting: Radiation Oncology

## 2017-02-23 DIAGNOSIS — C541 Malignant neoplasm of endometrium: Secondary | ICD-10-CM

## 2017-02-23 DIAGNOSIS — Z510 Encounter for antineoplastic radiation therapy: Secondary | ICD-10-CM | POA: Diagnosis not present

## 2017-02-23 NOTE — Progress Notes (Signed)
  Radiation Oncology         [phone] ________________________________  Name: [name] MRN: [id number]  Date: 02/23/2017  DOB: [birth year]  CC: [name], NP  [name], MD  HDR BRACHYTHERAPY NOTE  DIAGNOSIS: Endometrial adenocarcinoma, FIGO grade III, stage pT1b,snpN1a    Simple treatment device note: Patient had construction of her custom vaginal cylinder. She will be treated with a 2.5 cm diameter segmented cylinder. This conforms to her anatomy without undue discomfort.  Vaginal brachytherapy procedure node: The patient was brought to the [place] suite. Identity was confirmed. All relevant records and images related to the planned course of therapy were reviewed. The patient freely provided informed written consent to proceed with treatment after reviewing the details related to the planned course of therapy. The consent form was witnessed and verified by the simulation staff. Then, the patient was set-up in a stable reproducible supine position for radiation therapy. Pelvic exam revealed the vaginal cuff to be intact . The patient's custom vaginal cylinder was placed in the proximal vagina. This was affixed to the CT/MR stabilization plate to prevent slippage. Patient tolerated the placement well.  Verification simulation note:  A fiducial marker was placed within the vaginal cylinder. An AP and lateral film was then obtained through the pelvis area. This documented accurate position of the vaginal cylinder for treatment.  HDR BRACHYTHERAPY TREATMENT  The remote afterloading device was affixed to the vaginal cylinder by catheter. Patient then proceeded to undergo her fifth high-dose-rate treatment directed at the proximal vagina. The patient was prescribed a dose of 6 gray to be delivered to the mucosal surface. Treatment length was 3.5 cm. Patient was treated with 1 channel using 8 dwell positions. Treatment time was 244.8 seconds. Iridium 192 was the high-dose-rate source for  treatment. The patient tolerated the treatment well. After completion of her therapy, a radiation survey was performed documenting return of the iridium source into the GammaMed safe.   PLAN: The patient will continue with chemotherapy and will follow up with radiation oncology in 4-6 weeks.    -----------------------------------  Blair Promise, PhD, MD This document serves as a record of services personally performed by Gery Pray, MD. It was created on his behalf by Valeta Harms, a trained medical scribe. The creation of this record is based on the scribe's personal observations and the provider's statements to them. This document has been checked and approved by the attending provider.

## 2017-03-01 ENCOUNTER — Encounter: Payer: Self-pay | Admitting: Hematology and Oncology

## 2017-03-01 ENCOUNTER — Other Ambulatory Visit (HOSPITAL_BASED_OUTPATIENT_CLINIC_OR_DEPARTMENT_OTHER): Payer: Medicare Other

## 2017-03-01 ENCOUNTER — Telehealth: Payer: Self-pay | Admitting: Hematology and Oncology

## 2017-03-01 ENCOUNTER — Ambulatory Visit (HOSPITAL_BASED_OUTPATIENT_CLINIC_OR_DEPARTMENT_OTHER): Payer: Medicare Other

## 2017-03-01 ENCOUNTER — Ambulatory Visit (HOSPITAL_BASED_OUTPATIENT_CLINIC_OR_DEPARTMENT_OTHER): Payer: Medicare Other | Admitting: Hematology and Oncology

## 2017-03-01 DIAGNOSIS — R110 Nausea: Secondary | ICD-10-CM

## 2017-03-01 DIAGNOSIS — G620 Drug-induced polyneuropathy: Secondary | ICD-10-CM

## 2017-03-01 DIAGNOSIS — C541 Malignant neoplasm of endometrium: Secondary | ICD-10-CM | POA: Diagnosis not present

## 2017-03-01 DIAGNOSIS — L270 Generalized skin eruption due to drugs and medicaments taken internally: Secondary | ICD-10-CM | POA: Diagnosis not present

## 2017-03-01 DIAGNOSIS — T451X5A Adverse effect of antineoplastic and immunosuppressive drugs, initial encounter: Secondary | ICD-10-CM

## 2017-03-01 DIAGNOSIS — Z95828 Presence of other vascular implants and grafts: Secondary | ICD-10-CM

## 2017-03-01 DIAGNOSIS — Z5111 Encounter for antineoplastic chemotherapy: Secondary | ICD-10-CM | POA: Diagnosis not present

## 2017-03-01 DIAGNOSIS — Z5189 Encounter for other specified aftercare: Secondary | ICD-10-CM | POA: Diagnosis not present

## 2017-03-01 DIAGNOSIS — Z452 Encounter for adjustment and management of vascular access device: Secondary | ICD-10-CM

## 2017-03-01 LAB — COMPREHENSIVE METABOLIC PANEL
ALT: 16 U/L (ref 0–55)
AST: 13 U/L (ref 5–34)
Albumin: 4.2 g/dL (ref 3.5–5.0)
Alkaline Phosphatase: 85 U/L (ref 40–150)
Anion Gap: 11 mEq/L (ref 3–11)
BUN: 26.9 mg/dL — ABNORMAL HIGH (ref 7.0–26.0)
CO2: 21 mEq/L — ABNORMAL LOW (ref 22–29)
Calcium: 9.2 mg/dL (ref 8.4–10.4)
Chloride: 108 mEq/L (ref 98–109)
Creatinine: 0.8 mg/dL (ref 0.6–1.1)
EGFR: >60 mL/min/{1.73_m2} (ref >60)
Glucose: 208 mg/dl — ABNORMAL HIGH (ref 70–140)
Potassium: 4 mEq/L (ref 3.5–5.1)
Sodium: 141 mEq/L (ref 136–145)
Total Bilirubin: 0.37 mg/dL (ref 0.20–1.20)
Total Protein: 7.2 g/dL (ref 6.4–8.3)

## 2017-03-01 LAB — CBC WITH DIFFERENTIAL/PLATELET
BASO%: 0 % (ref 0.0–2.0)
Basophils Absolute: 0 10*3/uL (ref 0.0–0.1)
EOS%: 0 % (ref 0.0–7.0)
Eosinophils Absolute: 0 10*3/uL (ref 0.0–0.5)
HCT: 35.5 % (ref 34.8–46.6)
HGB: 11.8 g/dL (ref 11.6–15.9)
LYMPH%: 6.9 % — ABNORMAL LOW (ref 14.0–49.7)
MCH: 33.3 pg (ref 25.1–34.0)
MCHC: 33.2 g/dL (ref 31.5–36.0)
MCV: 100.3 fL (ref 79.5–101.0)
MONO#: 0.1 10*3/uL (ref 0.1–0.9)
MONO%: 0.7 % (ref 0.0–14.0)
NEUT#: 8 10*3/uL — ABNORMAL HIGH (ref 1.5–6.5)
NEUT%: 92.4 % — ABNORMAL HIGH (ref 38.4–76.8)
Platelets: 172 10*3/uL (ref 145–400)
RBC: 3.54 10*6/uL — ABNORMAL LOW (ref 3.70–5.45)
RDW: 15.4 % — ABNORMAL HIGH (ref 11.2–14.5)
WBC: 8.7 10*3/uL (ref 3.9–10.3)
lymph#: 0.6 10*3/uL — ABNORMAL LOW (ref 0.9–3.3)

## 2017-03-01 MED ORDER — FAMOTIDINE IN NACL 20-0.9 MG/50ML-% IV SOLN
20.0000 mg | Freq: Once | INTRAVENOUS | Status: AC
  Administered 2017-03-01: 20 mg via INTRAVENOUS

## 2017-03-01 MED ORDER — PALONOSETRON HCL INJECTION 0.25 MG/5ML
0.2500 mg | Freq: Once | INTRAVENOUS | Status: AC
  Administered 2017-03-01: 0.25 mg via INTRAVENOUS

## 2017-03-01 MED ORDER — CARBOPLATIN CHEMO INJECTION 600 MG/60ML
530.0000 mg | Freq: Once | INTRAVENOUS | Status: AC
  Administered 2017-03-01: 530 mg via INTRAVENOUS
  Filled 2017-03-01: qty 53

## 2017-03-01 MED ORDER — HEPARIN SOD (PORK) LOCK FLUSH 100 UNIT/ML IV SOLN
500.0000 [IU] | Freq: Once | INTRAVENOUS | Status: DC | PRN
  Filled 2017-03-01: qty 5

## 2017-03-01 MED ORDER — SODIUM CHLORIDE 0.9 % IV SOLN
Freq: Once | INTRAVENOUS | Status: AC
  Administered 2017-03-01: 12:00:00 via INTRAVENOUS

## 2017-03-01 MED ORDER — SODIUM CHLORIDE 0.9 % IV SOLN
Freq: Once | INTRAVENOUS | Status: AC
  Administered 2017-03-01: 12:00:00 via INTRAVENOUS
  Filled 2017-03-01: qty 5

## 2017-03-01 MED ORDER — FAMOTIDINE IN NACL 20-0.9 MG/50ML-% IV SOLN
INTRAVENOUS | Status: AC
  Filled 2017-03-01: qty 50

## 2017-03-01 MED ORDER — SODIUM CHLORIDE 0.9% FLUSH
10.0000 mL | INTRAVENOUS | Status: DC | PRN
  Administered 2017-03-01: 10 mL via INTRAVENOUS
  Filled 2017-03-01: qty 10

## 2017-03-01 MED ORDER — SODIUM CHLORIDE 0.9 % IV SOLN
140.0000 mg/m2 | Freq: Once | INTRAVENOUS | Status: AC
  Administered 2017-03-01: 264 mg via INTRAVENOUS
  Filled 2017-03-01: qty 44

## 2017-03-01 MED ORDER — DIPHENHYDRAMINE HCL 50 MG/ML IJ SOLN
INTRAMUSCULAR | Status: AC
  Filled 2017-03-01: qty 1

## 2017-03-01 MED ORDER — PEGFILGRASTIM 6 MG/0.6ML ~~LOC~~ PSKT
PREFILLED_SYRINGE | SUBCUTANEOUS | Status: AC
  Filled 2017-03-01: qty 0.6

## 2017-03-01 MED ORDER — PEGFILGRASTIM 6 MG/0.6ML ~~LOC~~ PSKT
6.0000 mg | PREFILLED_SYRINGE | Freq: Once | SUBCUTANEOUS | Status: AC
  Administered 2017-03-01: 6 mg via SUBCUTANEOUS

## 2017-03-01 MED ORDER — DIPHENHYDRAMINE HCL 50 MG/ML IJ SOLN
50.0000 mg | Freq: Once | INTRAMUSCULAR | Status: AC
Start: 2017-03-01 — End: 2017-03-01
  Administered 2017-03-01: 50 mg via INTRAVENOUS

## 2017-03-01 MED ORDER — SODIUM CHLORIDE 0.9% FLUSH
10.0000 mL | INTRAVENOUS | Status: DC | PRN
  Filled 2017-03-01: qty 10

## 2017-03-01 MED ORDER — PALONOSETRON HCL INJECTION 0.25 MG/5ML
INTRAVENOUS | Status: AC
  Filled 2017-03-01: qty 5

## 2017-03-01 NOTE — Assessment & Plan Note (Signed)
She tolerated the treatment reasonably well but did have some mild nausea and mild neuropathy She had recent transient rash, likely induced by steroids Her symptoms are much improved with reduced dose Taxol and gabapentin We will continue similar dose without further dose adjustment

## 2017-03-01 NOTE — Progress Notes (Signed)
[name] OFFICE PROGRESS NOTE  Patient Care Team: [name], NP as PCP - General (Obstetrics and Gynecology)  SUMMARY OF ONCOLOGIC HISTORY: Oncology History   MSI stable     Endometrial cancer ([place])   10/15/2016 Pathology Results    Endometrium, resection - ENDOMETRIOID ADENOCARCINOMA. - SEE COMMENT. Microscopic Comment The carcinoma appears FIGO grade II. There are foci of adenocarcinoma involving smooth muscle.       10/15/2016 Surgery    Preoperative diagnosis: Postmenopausal bleeding, endometrial mass/ thick endometrial stripe.                                       Cervical stenosis    Postop diagnosis: Same.   Procedure: Hysteroscopic resection of endometrial mass with Myosure Lite and dilatation and curettage Anesthesia General  Surgeon: [name], MD  IV fluids: LR 1 liter Estimated blood loss : 100 cc  Saline Fluid deficit: 1060 cc  Urine output: straight catheter preop 25 cc  Complications none  Condition stable  Disposition PACU  Specimen: Endometrial mass resected tissue, endometrial curetting, endometrial fluid      11/23/2016 Pathology Results    1. Lymph node, sentinel, biopsy, right external iliac - NO CARCINOMA IDENTIFIED IN ONE LYMPH NODE (0/1) - ENDOSALPINGIOSIS 2. Lymph node, sentinel, biopsy, left external iliac - METASTATIC CARCINOMA INVOLVING ONE LYMPH NODE 1/1 - ENDOSALPINGIOSIS 3. Uterus +/- tubes/ovaries, neoplastic, and left ovary, cervix UTERUS:    - ENDOMETRIOID ADENOCARCINOMA, FIGO GRADE 3 OF 3 - LEIOMYOMATA (1.6 CM; LARGEST) - ADENOMYOSIS CERVIX: - NO CARCINOMA IDENTIFIED LEFT OVARY: - NO CARCINOMA IDENTIFIED Microscopic Comment 3. ONCOLOGY TABLE-UTERUS, CARCINOMA OR CARCINOSARCOMA Specimen: Uterus, cervix, left ovary, and sentinel lymph nodes Procedure: Total hysterectomy, left oophorectomy and sentinel lymph node biopsies Maximum tumor size: 6 cm Histologic type: Endometrioid carcinoma, not otherwise  specified Grade: FIGO 3 Myometrial invasion: 1.3 cm where myometrium is 1.9 cm in thickness Cervical stromal involvement: Not identified Extent of involvement of other organs: Not identified Lymph - vascular invasion: Not identified Peritoneal washings: Not submitted/found Lymph nodes: Examined: 2 Sentinel 0 Non-sentinel 2 Total Lymph nodes with metastasis: 1 Isolated tumor cells (< 0.2 mm): 0 Micrometastasis: (> 0.2 mm and < 2.0 mm): 0 Macrometastasis: (> 2.0 mm): 1 Extracapsular extension: Not identified TNM code: pT1b, snpN1a FIGO Stage (based on pathologic findings, needs clinical correlation): IIIC1      11/23/2016 Surgery    Operation: Robotic-assisted laparoscopic total hysterectomy with left salpingoophorectomy,  Sentinel lymph node biopsy.  Surgeon: [name]  Assistant Surgeon: Lahoma Crocker MD Operative Findings:  : 6cm normal appearing uterus. 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 No suspcious nodes. Adhesions between left ovary and left ovarian fossa.       12/09/2016 PET scan    Recent hysterectomy with small amount of free fluid in pelvis. No evidence of the residual or metastatic carcinoma.  Single 9 mm left level 2A cervical lymph node with low-grade metabolic activity, which is nonspecific but likely reactive in etiology.  Incidentally noted sigmoid diverticulosis and cholelithiasis.      12/23/2016 Procedure    Placement of a subcutaneous port device      12/27/2016 -  Chemotherapy    She received carboplatin and Taxol       INTERVAL HISTORY: Please see below for problem oriented charting. She is seen prior to cycle 4 of chemotherapy She  had mild nausea, transient skin rash and stable peripheral neuropathy while on treatment Overall, she tolerated treatment well No recent infection  REVIEW OF SYSTEMS:   Constitutional: Denies fevers, chills or abnormal weight loss Eyes: Denies blurriness of  vision Ears, nose, mouth, throat, and face: Denies mucositis or sore throat Respiratory: Denies cough, dyspnea or wheezes Cardiovascular: Denies palpitation, chest discomfort or lower extremity swelling Gastrointestinal:  Denies nausea, heartburn or change in bowel habits Skin: Denies abnormal skin rashes Lymphatics: Denies new lymphadenopathy or easy bruising Neurological:Denies numbness, tingling or new weaknesses Behavioral/Psych: Mood is stable, no new changes  All other systems were reviewed with the patient and are negative.  I have reviewed the past medical history, past surgical history, social history and family history with the patient and they are unchanged from previous note.  ALLERGIES:  has No Known Allergies.  MEDICATIONS:  Current Outpatient Medications  Medication Sig Dispense Refill  . dexamethasone (DECADRON) 4 MG tablet Take 5 tabs the day before chemo and 5 tabs the morning of chemo, every 3 weeks x 6 60 tablet 1  . gabapentin (NEURONTIN) 300 MG capsule Take 1 capsule (300 mg total) by mouth 3 (three) times daily. (Patient not taking: Reported on 01/18/2017) 90 capsule 9  . ibuprofen (ADVIL,MOTRIN) 200 MG tablet Take 600 mg by mouth 2 (two) times daily as needed for moderate pain.    Marland Kitchen levothyroxine (SYNTHROID, LEVOTHROID) 137 MCG tablet TAKE ONE TABLET DAILY IN THE MORNING 1/2 HOUR PRIOR TO FOOD  5  . lidocaine-prilocaine (EMLA) cream Apply to affected area once 30 g 3  . ondansetron (ZOFRAN) 8 MG tablet Take 1 tablet (8 mg total) by mouth every 8 (eight) hours as needed for refractory nausea / vomiting. Start on day 3 after chemo. 30 tablet 1  . OVER THE COUNTER MEDICATION Take 5 tablets by mouth 3 (three) times a week. Pyridoxal 1, 2, 3, and 4 otc supplement  Vitamin package    . OVER THE COUNTER MEDICATION Take 1 tablet by mouth as directed. Sulfur 24 g7 otc supplement take 1 tablet daily 5 days out of the week  Adrenaline gland support    . OVER THE COUNTER  MEDICATION Take 1 capsule by mouth See admin instructions. Carbon 90 bx otc supplement take 1 capsule daily 5 days out of the week  Omega 3 supplement    . prochlorperazine (COMPAZINE) 10 MG tablet Take 1 tablet (10 mg total) by mouth every 6 (six) hours as needed (Nausea or vomiting). 30 tablet 1   No current facility-administered medications for this visit.    Facility-Administered Medications Ordered in Other Visits  Medication Dose Route Frequency Provider Last Rate Last Dose  . 0.9 %  sodium chloride infusion   Intravenous Once Alvy Bimler, Ni, MD      . CARBOplatin (PARAPLATIN) 530 mg in sodium chloride 0.9 % 250 mL chemo infusion  530 mg Intravenous Once Alvy Bimler, Ni, MD      . diphenhydrAMINE (BENADRYL) injection 50 mg  50 mg Intravenous Once Alvy Bimler, Ni, MD      . famotidine (PEPCID) IVPB 20 mg premix  20 mg Intravenous Once Alvy Bimler, Ni, MD      . fosaprepitant (EMEND) 150 mg, dexamethasone (DECADRON) 12 mg in sodium chloride 0.9 % 145 mL IVPB   Intravenous Once Gorsuch, Ni, MD      . heparin lock flush 100 unit/mL  500 Units Intracatheter Once PRN Heath Lark, MD      . PACLitaxel (TAXOL)  264 mg in dextrose 5 % 250 mL chemo infusion (> 47m/m2)  140 mg/m2 (Treatment Plan Recorded) Intravenous Once GAlvy Bimler Ni, MD      . palonosetron (ALOXI) injection 0.25 mg  0.25 mg Intravenous Once Gorsuch, Ni, MD      . pegfilgrastim (NEULASTA ONPRO KIT) injection 6 mg  6 mg Subcutaneous Once Gorsuch, Ni, MD      . sodium chloride flush (NS) 0.9 % injection 10 mL  10 mL Intracatheter PRN GAlvy Bimler Ni, MD        PHYSICAL EXAMINATION: ECOG PERFORMANCE STATUS: 1 - Symptomatic but completely ambulatory  Vitals:   03/01/17 1046  BP: (!) 131/58  Pulse: 81  Resp: 18  Temp: (!) 97.3 F (36.3 C)  SpO2: 95%   Filed Weights   03/01/17 1046  Weight: 156 lb 4.8 oz (70.9 kg)    GENERAL:alert, no distress and comfortable SKIN: skin color, texture, turgor are normal, no rashes or significant  lesions EYES: normal, Conjunctiva are pink and non-injected, sclera clear OROPHARYNX:no exudate, no erythema and lips, buccal mucosa, and tongue normal  NECK: supple, thyroid normal size, non-tender, without nodularity LYMPH:  no palpable lymphadenopathy in the cervical, axillary or inguinal LUNGS: clear to auscultation and percussion with normal breathing effort HEART: regular rate & rhythm and no murmurs and no lower extremity edema ABDOMEN:abdomen soft, non-tender and normal bowel sounds Musculoskeletal:no cyanosis of digits and no clubbing  NEURO: alert & oriented x 3 with fluent speech, no focal motor/sensory deficits  LABORATORY DATA:  I have reviewed the data as listed    Component Value Date/Time   NA 141 03/01/2017 1007   K 4.0 03/01/2017 1007   CL 109 12/23/2016 1030   CO2 21 (L) 03/01/2017 1007   GLUCOSE 208 (H) 03/01/2017 1007   BUN 26.9 (H) 03/01/2017 1007   CREATININE 0.8 03/01/2017 1007   CALCIUM 9.2 03/01/2017 1007   PROT 7.2 03/01/2017 1007   ALBUMIN 4.2 03/01/2017 1007   AST 13 03/01/2017 1007   ALT 16 03/01/2017 1007   ALKPHOS 85 03/01/2017 1007   BILITOT 0.37 03/01/2017 1007   GFRNONAA >60 12/23/2016 1030   GFRAA >60 12/23/2016 1030    No results found for: SPEP, UPEP  Lab Results  Component Value Date   WBC 8.7 03/01/2017   NEUTROABS 8.0 (H) 03/01/2017   HGB 11.8 03/01/2017   HCT 35.5 03/01/2017   MCV 100.3 03/01/2017   PLT 172 03/01/2017      Chemistry      Component Value Date/Time   NA 141 03/01/2017 1007   K 4.0 03/01/2017 1007   CL 109 12/23/2016 1030   CO2 21 (L) 03/01/2017 1007   BUN 26.9 (H) 03/01/2017 1007   CREATININE 0.8 03/01/2017 1007      Component Value Date/Time   CALCIUM 9.2 03/01/2017 1007   ALKPHOS 85 03/01/2017 1007   AST 13 03/01/2017 1007   ALT 16 03/01/2017 1007   BILITOT 0.37 03/01/2017 1007       ASSESSMENT & PLAN:  [place]omet[place] ([place] She tolerated the treatment reasonably well but did have some  mild nausea and mild neuropathy She had recent transient rash, likely induced by steroids Her symptoms are much improved with reduced dose Taxol and gabapentin We will continue similar dose without further dose adjustment  [place] We will continue on reduced dose chemotherapy It is stable  Chemotherapy-induced nausea She has intermittent chemotherapy-induced nausea I recommend she takes antiemetics on  the regular basis a few days after each treatment  Drug-induced skin rash She has mild skin rash likely due to steroids It had resolved Recommend continue conservative management only   No orders of the defined types were placed in this encounter.  All questions were answered. The patient knows to call the clinic with any problems, questions or concerns. No barriers to learning was detected. I spent 15 minutes counseling the patient face to face. The total time spent in the appointment was 20 minutes and more than 50% was on counseling and review of test results     Heath Lark, MD 03/01/2017 11:38 AM

## 2017-03-01 NOTE — Telephone Encounter (Signed)
Scheduled appt per 12/11 los - Gave patient AVS and calender per los.  

## 2017-03-01 NOTE — Assessment & Plan Note (Signed)
We will continue on reduced dose chemotherapy It is stable

## 2017-03-01 NOTE — Assessment & Plan Note (Signed)
She has mild skin rash likely due to steroids It had resolved Recommend continue conservative management only

## 2017-03-01 NOTE — Assessment & Plan Note (Signed)
She has intermittent chemotherapy-induced nausea I recommend she takes antiemetics on the regular basis a few days after each treatment

## 2017-03-01 NOTE — Patient Instructions (Signed)
[facility] Discharge Instructions for Patients Receiving Chemotherapy  Today you received the following chemotherapy agents:  Taxol, Carboplatin  To help prevent nausea and vomiting after your treatment, we encourage you to take your nausea medication as prescribed.   If you develop nausea and vomiting that is not controlled by your nausea medication, call the clinic.   BELOW ARE SYMPTOMS THAT SHOULD BE REPORTED IMMEDIATELY:  *FEVER GREATER THAN 100.5 F  *CHILLS WITH OR WITHOUT FEVER  NAUSEA AND VOMITING THAT IS NOT CONTROLLED WITH YOUR NAUSEA MEDICATION  *UNUSUAL SHORTNESS OF BREATH  *UNUSUAL BRUISING OR BLEEDING  TENDERNESS IN MOUTH AND THROAT WITH OR WITHOUT PRESENCE OF ULCERS  *URINARY PROBLEMS  *BOWEL PROBLEMS  UNUSUAL RASH Items with * indicate a potential emergency and should be followed up as soon as possible.  Feel free to call the clinic should you have any questions or concerns. The clinic phone number is [phone].  Please show the CHEMO ALERT CARD at check-in to the Emergency Department and triage nurse.   

## 2017-03-07 ENCOUNTER — Telehealth: Payer: Self-pay | Admitting: *Deleted

## 2017-03-07 DIAGNOSIS — C541 Malignant neoplasm of endometrium: Secondary | ICD-10-CM

## 2017-03-07 NOTE — Telephone Encounter (Signed)
Pt states she called [facility] as discussed with Dr Alvy Bimler. [facility] states they need a referral placed in EPIC

## 2017-03-08 ENCOUNTER — Encounter: Payer: Self-pay | Admitting: Neurology

## 2017-03-24 ENCOUNTER — Encounter: Payer: Self-pay | Admitting: Radiation Oncology

## 2017-03-24 NOTE — Progress Notes (Signed)
  Radiation Oncology         [phone] ________________________________  Name: [name] MRN: [id number]  Date: 03/24/2017  DOB: 07/20/1950  End of Treatment Note  Diagnosis:  Endometrial adenocarcinoma, FIGO grade III, stage pT1b,snpN1a   Indication for treatment:  Curative, postop, adjuvant       Radiation treatment dates:   01/18/17, 01/27/17, 02/09/17, 02/16/17, 02/23/17  Site/dose:   Vaginal cuff / 30 Gy delivered in 5 fractions  Beams/energy:   HDR Ir-192 Vaginal  Narrative: The patient tolerated radiation treatment relatively well. Initially she was asymptomatic and remained so throughout the course of treatment. She is without acute complaints and denied nausea, vomiting, fatigue or skin irritation.  Plan: The patient has completed radiation treatment. The patient will return to radiation oncology clinic for routine followup in one month. I advised them to call or return sooner if they have any questions or concerns related to their recovery or treatment.  -----------------------------------  Blair Promise, PhD, MD  This document serves as a record of services personally performed by Gery Pray, MD. It was created on his behalf by Marlowe Kays, a trained medical scribe. The creation of this record is based on the scribe's personal observations and the provider's statements to them. This document has been checked and approved by the attending provider.

## 2017-03-29 ENCOUNTER — Inpatient Hospital Stay: Payer: Medicare Other | Attending: Hematology and Oncology

## 2017-03-29 ENCOUNTER — Inpatient Hospital Stay: Payer: Medicare Other

## 2017-03-29 ENCOUNTER — Inpatient Hospital Stay (HOSPITAL_BASED_OUTPATIENT_CLINIC_OR_DEPARTMENT_OTHER): Payer: Medicare Other | Admitting: Hematology and Oncology

## 2017-03-29 DIAGNOSIS — Z79899 Other long term (current) drug therapy: Secondary | ICD-10-CM | POA: Insufficient documentation

## 2017-03-29 DIAGNOSIS — T451X5A Adverse effect of antineoplastic and immunosuppressive drugs, initial encounter: Secondary | ICD-10-CM

## 2017-03-29 DIAGNOSIS — R110 Nausea: Secondary | ICD-10-CM | POA: Insufficient documentation

## 2017-03-29 DIAGNOSIS — C541 Malignant neoplasm of endometrium: Secondary | ICD-10-CM

## 2017-03-29 DIAGNOSIS — Z7689 Persons encountering health services in other specified circumstances: Secondary | ICD-10-CM | POA: Insufficient documentation

## 2017-03-29 DIAGNOSIS — Z95828 Presence of other vascular implants and grafts: Secondary | ICD-10-CM

## 2017-03-29 DIAGNOSIS — Z5111 Encounter for antineoplastic chemotherapy: Secondary | ICD-10-CM | POA: Insufficient documentation

## 2017-03-29 DIAGNOSIS — G629 Polyneuropathy, unspecified: Secondary | ICD-10-CM | POA: Insufficient documentation

## 2017-03-29 DIAGNOSIS — T380X5A Adverse effect of glucocorticoids and synthetic analogues, initial encounter: Secondary | ICD-10-CM | POA: Diagnosis not present

## 2017-03-29 DIAGNOSIS — R739 Hyperglycemia, unspecified: Secondary | ICD-10-CM | POA: Diagnosis not present

## 2017-03-29 DIAGNOSIS — G620 Drug-induced polyneuropathy: Secondary | ICD-10-CM

## 2017-03-29 DIAGNOSIS — Z923 Personal history of irradiation: Secondary | ICD-10-CM | POA: Diagnosis not present

## 2017-03-29 LAB — CBC WITH DIFFERENTIAL/PLATELET
Abs Granulocyte: 4.9 10*3/uL (ref 1.5–6.5)
Basophils Absolute: 0 10*3/uL (ref 0.0–0.1)
Basophils Relative: 0 %
Eosinophils Absolute: 0 10*3/uL (ref 0.0–0.5)
Eosinophils Relative: 0 %
HCT: 35 % (ref 34.8–46.6)
Hemoglobin: 11.7 g/dL (ref 11.6–15.9)
Lymphocytes Relative: 11 %
Lymphs Abs: 0.6 10*3/uL — ABNORMAL LOW (ref 0.9–3.3)
MCH: 34.2 pg — ABNORMAL HIGH (ref 25.1–34.0)
MCHC: 33.4 g/dL (ref 31.5–36.0)
MCV: 102.3 fL — ABNORMAL HIGH (ref 79.5–101.0)
Monocytes Absolute: 0.1 10*3/uL (ref 0.1–0.9)
Monocytes Relative: 1 %
Neutro Abs: 4.9 10*3/uL (ref 1.5–6.5)
Neutrophils Relative %: 88 %
Platelets: 162 10*3/uL (ref 145–400)
RBC: 3.42 MIL/uL — ABNORMAL LOW (ref 3.70–5.45)
RDW: 14.5 % (ref 11.2–16.1)
WBC: 5.6 10*3/uL (ref 3.9–10.3)

## 2017-03-29 LAB — COMPREHENSIVE METABOLIC PANEL
ALT: 18 U/L (ref 0–55)
AST: 15 U/L (ref 5–34)
Albumin: 4.2 g/dL (ref 3.5–5.0)
Alkaline Phosphatase: 86 U/L (ref 40–150)
Anion gap: 9 (ref 3–11)
BUN: 25 mg/dL (ref 7–26)
CO2: 23 mmol/L (ref 22–29)
Calcium: 9.3 mg/dL (ref 8.4–10.4)
Chloride: 109 mmol/L (ref 98–109)
Creatinine, Ser: 0.82 mg/dL (ref 0.60–1.10)
GFR calc Af Amer: >60 mL/min (ref >60)
GFR calc non Af Amer: >60 mL/min (ref >60)
Glucose, Bld: 207 mg/dL — ABNORMAL HIGH (ref 70–140)
Potassium: 4.1 mmol/L (ref 3.3–4.7)
Sodium: 141 mmol/L (ref 136–145)
Total Bilirubin: 0.4 mg/dL (ref 0.2–1.2)
Total Protein: 7 g/dL (ref 6.4–8.3)

## 2017-03-29 MED ORDER — SODIUM CHLORIDE 0.9 % IV SOLN
Freq: Once | INTRAVENOUS | Status: AC
  Administered 2017-03-29: 11:00:00 via INTRAVENOUS
  Filled 2017-03-29: qty 5

## 2017-03-29 MED ORDER — SODIUM CHLORIDE 0.9 % IV SOLN
530.0000 mg | Freq: Once | INTRAVENOUS | Status: AC
  Administered 2017-03-29: 530 mg via INTRAVENOUS
  Filled 2017-03-29: qty 53

## 2017-03-29 MED ORDER — HEPARIN SOD (PORK) LOCK FLUSH 100 UNIT/ML IV SOLN
500.0000 [IU] | Freq: Once | INTRAVENOUS | Status: AC | PRN
  Administered 2017-03-29: 500 [IU]
  Filled 2017-03-29: qty 5

## 2017-03-29 MED ORDER — PEGFILGRASTIM 6 MG/0.6ML ~~LOC~~ PSKT
PREFILLED_SYRINGE | SUBCUTANEOUS | Status: AC
  Filled 2017-03-29: qty 0.6

## 2017-03-29 MED ORDER — DIPHENHYDRAMINE HCL 50 MG/ML IJ SOLN
50.0000 mg | Freq: Once | INTRAMUSCULAR | Status: AC
  Administered 2017-03-29: 50 mg via INTRAVENOUS

## 2017-03-29 MED ORDER — FAMOTIDINE IN NACL 20-0.9 MG/50ML-% IV SOLN
INTRAVENOUS | Status: AC
  Filled 2017-03-29: qty 50

## 2017-03-29 MED ORDER — SODIUM CHLORIDE 0.9 % IV SOLN
140.0000 mg/m2 | Freq: Once | INTRAVENOUS | Status: AC
  Administered 2017-03-29: 264 mg via INTRAVENOUS
  Filled 2017-03-29: qty 44

## 2017-03-29 MED ORDER — PALONOSETRON HCL INJECTION 0.25 MG/5ML
INTRAVENOUS | Status: AC
  Filled 2017-03-29: qty 5

## 2017-03-29 MED ORDER — PEGFILGRASTIM 6 MG/0.6ML ~~LOC~~ PSKT
6.0000 mg | PREFILLED_SYRINGE | Freq: Once | SUBCUTANEOUS | Status: AC
  Administered 2017-03-29: 6 mg via SUBCUTANEOUS

## 2017-03-29 MED ORDER — FAMOTIDINE IN NACL 20-0.9 MG/50ML-% IV SOLN
20.0000 mg | Freq: Once | INTRAVENOUS | Status: AC
  Administered 2017-03-29: 20 mg via INTRAVENOUS

## 2017-03-29 MED ORDER — PALONOSETRON HCL INJECTION 0.25 MG/5ML
0.2500 mg | Freq: Once | INTRAVENOUS | Status: AC
  Administered 2017-03-29: 0.25 mg via INTRAVENOUS

## 2017-03-29 MED ORDER — DIPHENHYDRAMINE HCL 50 MG/ML IJ SOLN
INTRAMUSCULAR | Status: AC
  Filled 2017-03-29: qty 1

## 2017-03-29 MED ORDER — L-METHYLFOLATE-B6-B12 3-35-2 MG PO TABS: 1.0000 | ORAL_TABLET | Freq: Every day | ORAL | 1 refills | Status: DC

## 2017-03-29 MED ORDER — SODIUM CHLORIDE 0.9% FLUSH
10.0000 mL | INTRAVENOUS | Status: DC | PRN
  Administered 2017-03-29: 10 mL
  Filled 2017-03-29: qty 10

## 2017-03-29 MED ORDER — SODIUM CHLORIDE 0.9 % IV SOLN
Freq: Once | INTRAVENOUS | Status: AC
  Administered 2017-03-29: 10:00:00 via INTRAVENOUS

## 2017-03-29 MED ORDER — SODIUM CHLORIDE 0.9% FLUSH
10.0000 mL | INTRAVENOUS | Status: DC | PRN
  Administered 2017-03-29: 10 mL via INTRAVENOUS
  Filled 2017-03-29: qty 10

## 2017-03-29 NOTE — Patient Instructions (Signed)
Implanted Port Home Guide An implanted port is a type of central line that is placed under the skin. Central lines are used to provide IV access when treatment or nutrition needs to be given through a person's veins. Implanted ports are used for long-term IV access. An implanted port may be placed because:  You need IV medicine that would be irritating to the small veins in your hands or arms.  You need long-term IV medicines, such as antibiotics.  You need IV nutrition for a long period.  You need frequent blood draws for lab tests.  You need dialysis.  Implanted ports are usually placed in the chest area, but they can also be placed in the upper arm, the abdomen, or the leg. An implanted port has two main parts:  Reservoir. The reservoir is round and will appear as a small, raised area under your skin. The reservoir is the part where a needle is inserted to give medicines or draw blood.  Catheter. The catheter is a thin, flexible tube that extends from the reservoir. The catheter is placed into a large vein. Medicine that is inserted into the reservoir goes into the catheter and then into the vein.  How will I care for my incision site? Do not get the incision site wet. Bathe or shower as directed by your health care provider. How is my port accessed? Special steps must be taken to access the port:  Before the port is accessed, a numbing cream can be placed on the skin. This helps numb the skin over the port site.  Your health care provider uses a sterile technique to access the port. ? Your health care provider must put on a mask and sterile gloves. ? The skin over your port is cleaned carefully with an antiseptic and allowed to dry. ? The port is gently pinched between sterile gloves, and a needle is inserted into the port.  Only "non-coring" port needles should be used to access the port. Once the port is accessed, a blood return should be checked. This helps ensure that the port  is in the vein and is not clogged.  If your port needs to remain accessed for a constant infusion, a clear (transparent) bandage will be placed over the needle site. The bandage and needle will need to be changed every week, or as directed by your health care provider.  Keep the bandage covering the needle clean and dry. Do not get it wet. Follow your health care provider's instructions on how to take a shower or bath while the port is accessed.  If your port does not need to stay accessed, no bandage is needed over the port.  What is flushing? Flushing helps keep the port from getting clogged. Follow your health care provider's instructions on how and when to flush the port. Ports are usually flushed with saline solution or a medicine called heparin. The need for flushing will depend on how the port is used.  If the port is used for intermittent medicines or blood draws, the port will need to be flushed: ? After medicines have been given. ? After blood has been drawn. ? As part of routine maintenance.  If a constant infusion is running, the port may not need to be flushed.  How long will my port stay implanted? The port can stay in for as long as your health care provider thinks it is needed. When it is time for the port to come out, surgery will be   done to remove it. The procedure is similar to the one performed when the port was put in. When should I seek immediate medical care? When you have an implanted port, you should seek immediate medical care if:  You notice a bad smell coming from the incision site.  You have swelling, redness, or drainage at the incision site.  You have more swelling or pain at the port site or the surrounding area.  You have a fever that is not controlled with medicine.  This information is not intended to replace advice given to you by your health care provider. Make sure you discuss any questions you have with your health care provider. Document  Released: 03/08/2005 Document Revised: 08/14/2015 Document Reviewed: 11/13/2012 Elsevier Interactive Patient Education  2017 [employer]  

## 2017-03-29 NOTE — Patient Instructions (Signed)
[facility] Discharge Instructions for Patients Receiving Chemotherapy  Today you received the following chemotherapy agents:  Taxol, Carboplatin  To help prevent nausea and vomiting after your treatment, we encourage you to take your nausea medication as prescribed.   If you develop nausea and vomiting that is not controlled by your nausea medication, call the clinic.   BELOW ARE SYMPTOMS THAT SHOULD BE REPORTED IMMEDIATELY:  *FEVER GREATER THAN 100.5 F  *CHILLS WITH OR WITHOUT FEVER  NAUSEA AND VOMITING THAT IS NOT CONTROLLED WITH YOUR NAUSEA MEDICATION  *UNUSUAL SHORTNESS OF BREATH  *UNUSUAL BRUISING OR BLEEDING  TENDERNESS IN MOUTH AND THROAT WITH OR WITHOUT PRESENCE OF ULCERS  *URINARY PROBLEMS  *BOWEL PROBLEMS  UNUSUAL RASH Items with * indicate a potential emergency and should be followed up as soon as possible.  Feel free to call the clinic should you have any questions or concerns. The clinic phone number is [phone].  Please show the CHEMO ALERT CARD at check-in to the Emergency Department and triage nurse.   

## 2017-03-30 ENCOUNTER — Encounter: Payer: Self-pay | Admitting: Hematology and Oncology

## 2017-03-30 NOTE — Assessment & Plan Note (Signed)
She tolerated the treatment reasonably well but did have some mild nausea and mild neuropathy Her symptoms are much improved with reduced dose Taxol and gabapentin We will continue similar dose without further dose adjustment to complete total of 6 cycles of chemotherapy.

## 2017-03-30 NOTE — Progress Notes (Signed)
[facility] OFFICE PROGRESS NOTE  Patient Care Team: [name], NP as PCP - General (Obstetrics and Gynecology)  SUMMARY OF ONCOLOGIC HISTORY: Oncology History   MSI stable     Endometrial cancer ([place])   10/15/2016 Pathology Results    Endometrium, resection - ENDOMETRIOID ADENOCARCINOMA. - SEE COMMENT. Microscopic Comment The carcinoma appears FIGO grade II. There are foci of adenocarcinoma involving smooth muscle.       10/15/2016 Surgery    Preoperative diagnosis: Postmenopausal bleeding, endometrial mass/ thick endometrial stripe.                                       Cervical stenosis    Postop diagnosis: Same.   Procedure: Hysteroscopic resection of endometrial mass with Myosure Lite and dilatation and curettage Anesthesia General  Surgeon: [name], MD  IV fluids: LR 1 liter Estimated blood loss : 100 cc  Saline Fluid deficit: 1060 cc  Urine output: straight catheter preop 25 cc  Complications none  Condition stable  Disposition PACU  Specimen: Endometrial mass resected tissue, endometrial curetting, endometrial fluid      11/23/2016 Pathology Results    1. Lymph node, sentinel, biopsy, right external iliac - NO CARCINOMA IDENTIFIED IN ONE LYMPH NODE (0/1) - ENDOSALPINGIOSIS 2. Lymph node, sentinel, biopsy, left external iliac - METASTATIC CARCINOMA INVOLVING ONE LYMPH NODE 1/1 - ENDOSALPINGIOSIS 3. Uterus +/- tubes/ovaries, neoplastic, and left ovary, cervix UTERUS:    - ENDOMETRIOID ADENOCARCINOMA, FIGO GRADE 3 OF 3 - LEIOMYOMATA (1.6 CM; LARGEST) - ADENOMYOSIS CERVIX: - NO CARCINOMA IDENTIFIED LEFT OVARY: - NO CARCINOMA IDENTIFIED Microscopic Comment 3. ONCOLOGY TABLE-UTERUS, CARCINOMA OR CARCINOSARCOMA Specimen: Uterus, cervix, left ovary, and sentinel lymph nodes Procedure: Total hysterectomy, left oophorectomy and sentinel lymph node biopsies Maximum tumor size: 6 cm Histologic type: Endometrioid carcinoma, not otherwise  specified Grade: FIGO 3 Myometrial invasion: 1.3 cm where myometrium is 1.9 cm in thickness Cervical stromal involvement: Not identified Extent of involvement of other organs: Not identified Lymph - vascular invasion: Not identified Peritoneal washings: Not submitted/found Lymph nodes: Examined: 2 Sentinel 0 Non-sentinel 2 Total Lymph nodes with metastasis: 1 Isolated tumor cells (< 0.2 mm): 0 Micrometastasis: (> 0.2 mm and < 2.0 mm): 0 Macrometastasis: (> 2.0 mm): 1 Extracapsular extension: Not identified TNM code: pT1b, snpN1a FIGO Stage (based on pathologic findings, needs clinical correlation): IIIC1      11/23/2016 Surgery    Operation: Robotic-assisted laparoscopic total hysterectomy with left salpingoophorectomy,  Sentinel lymph node biopsy.  Surgeon: [name]  Assistant Surgeon: Lahoma Crocker MD Operative Findings:  : 6cm normal appearing uterus. 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 No suspcious nodes. Adhesions between left ovary and left ovarian fossa.       12/09/2016 PET scan    Recent hysterectomy with small amount of free fluid in pelvis. No evidence of the residual or metastatic carcinoma.  Single 9 mm left level 2A cervical lymph node with low-grade metabolic activity, which is nonspecific but likely reactive in etiology.  Incidentally noted sigmoid diverticulosis and cholelithiasis.      12/23/2016 Procedure    Placement of a subcutaneous port device      12/27/2016 -  Chemotherapy    She received carboplatin and Taxol      01/18/2017 - 02/23/2017 Radiation Therapy    Radiation treatment dates:   01/18/17, 01/27/17, 02/09/17, 02/16/17, 02/23/17  Site/dose:  Vaginal cuff / 30 Gy delivered in 5 fractions  Beams/energy:   HDR Ir-192 Vaginal        INTERVAL HISTORY: Please see below for problem oriented charting. She returns for further follow-up She is doing well with improved energy She has been  going to the gym on a regular basis Her peripheral neuropathy is stable and not worse She has intermittent nausea, stable She denies recent infection  REVIEW OF SYSTEMS:   Constitutional: Denies fevers, chills or abnormal weight loss Eyes: Denies blurriness of vision Ears, nose, mouth, throat, and face: Denies mucositis or sore throat Respiratory: Denies cough, dyspnea or wheezes Cardiovascular: Denies palpitation, chest discomfort or lower extremity swelling Gastrointestinal:  Denies nausea, heartburn or change in bowel habits Skin: Denies abnormal skin rashes Lymphatics: Denies new lymphadenopathy or easy bruising Neurological:Denies numbness, tingling or new weaknesses Behavioral/Psych: Mood is stable, no new changes  All other systems were reviewed with the patient and are negative.  I have reviewed the past medical history, past surgical history, social history and family history with the patient and they are unchanged from previous note.  ALLERGIES:  has No Known Allergies.  MEDICATIONS:  Current Outpatient Medications  Medication Sig Dispense Refill  . dexamethasone (DECADRON) 4 MG tablet Take 5 tabs the day before chemo and 5 tabs the morning of chemo, every 3 weeks x 6 60 tablet 1  . gabapentin (NEURONTIN) 300 MG capsule Take 1 capsule (300 mg total) by mouth 3 (three) times daily. (Patient not taking: Reported on 01/18/2017) 90 capsule 9  . ibuprofen (ADVIL,MOTRIN) 200 MG tablet Take 600 mg by mouth 2 (two) times daily as needed for moderate pain.    Marland Kitchen l-methylfolate-B6-B12 (METANX) 3-35-2 MG TABS tablet Take 1 tablet by mouth daily. 90 tablet 1  . levothyroxine (SYNTHROID, LEVOTHROID) 137 MCG tablet TAKE ONE TABLET DAILY IN THE MORNING 1/2 HOUR PRIOR TO FOOD  5  . lidocaine-prilocaine (EMLA) cream Apply to affected area once 30 g 3  . ondansetron (ZOFRAN) 8 MG tablet Take 1 tablet (8 mg total) by mouth every 8 (eight) hours as needed for refractory nausea / vomiting. Start  on day 3 after chemo. 30 tablet 1  . OVER THE COUNTER MEDICATION Take 5 tablets by mouth 3 (three) times a week. Pyridoxal 1, 2, 3, and 4 otc supplement  Vitamin package    . OVER THE COUNTER MEDICATION Take 1 tablet by mouth as directed. Sulfur 24 g7 otc supplement take 1 tablet daily 5 days out of the week  Adrenaline gland support    . OVER THE COUNTER MEDICATION Take 1 capsule by mouth See admin instructions. Carbon 90 bx otc supplement take 1 capsule daily 5 days out of the week  Omega 3 supplement    . prochlorperazine (COMPAZINE) 10 MG tablet Take 1 tablet (10 mg total) by mouth every 6 (six) hours as needed (Nausea or vomiting). 30 tablet 1   No current facility-administered medications for this visit.     PHYSICAL EXAMINATION: ECOG PERFORMANCE STATUS: 1 - Symptomatic but completely ambulatory  Vitals:   03/29/17 0946  BP: 127/68  Pulse: 84  Resp: 18  Temp: 98.1 F (36.7 C)  SpO2: 100%   Filed Weights   03/29/17 0946  Weight: 154 lb 14.4 oz (70.3 kg)    GENERAL:alert, no distress and comfortable SKIN: skin color, texture, turgor are normal, no rashes or significant lesions EYES: normal, Conjunctiva are pink and non-injected, sclera clear OROPHARYNX:no exudate, no erythema and  lips, buccal mucosa, and tongue normal  NECK: supple, thyroid normal size, non-tender, without nodularity LYMPH:  no palpable lymphadenopathy in the cervical, axillary or inguinal LUNGS: clear to auscultation and percussion with normal breathing effort HEART: regular rate & rhythm and no murmurs and no lower extremity edema ABDOMEN:abdomen soft, non-tender and normal bowel sounds Musculoskeletal:no cyanosis of digits and no clubbing  NEURO: alert & oriented x 3 with fluent speech, no focal motor/sensory deficits  LABORATORY DATA:  I have reviewed the data as listed    Component Value Date/Time   NA 141 03/29/2017 0855   NA 141 03/01/2017 1007   K 4.1 03/29/2017 0855   K 4.0 03/01/2017 1007    CL 109 03/29/2017 0855   CO2 23 03/29/2017 0855   CO2 21 (L) 03/01/2017 1007   GLUCOSE 207 (H) 03/29/2017 0855   GLUCOSE 208 (H) 03/01/2017 1007   BUN 25 03/29/2017 0855   BUN 26.9 (H) 03/01/2017 1007   CREATININE 0.82 03/29/2017 0855   CREATININE 0.8 03/01/2017 1007   CALCIUM 9.3 03/29/2017 0855   CALCIUM 9.2 03/01/2017 1007   PROT 7.0 03/29/2017 0855   PROT 7.2 03/01/2017 1007   ALBUMIN 4.2 03/29/2017 0855   ALBUMIN 4.2 03/01/2017 1007   AST 15 03/29/2017 0855   AST 13 03/01/2017 1007   ALT 18 03/29/2017 0855   ALT 16 03/01/2017 1007   ALKPHOS 86 03/29/2017 0855   ALKPHOS 85 03/01/2017 1007   BILITOT 0.4 03/29/2017 0855   BILITOT 0.37 03/01/2017 1007   GFRNONAA >60 03/29/2017 0855   GFRAA >60 03/29/2017 0855    No results found for: SPEP, UPEP  Lab Results  Component Value Date   WBC 5.6 03/29/2017   NEUTROABS 4.9 03/29/2017   HGB 11.7 03/29/2017   HCT 35.0 03/29/2017   MCV 102.3 (H) 03/29/2017   PLT 162 03/29/2017      Chemistry      Component Value Date/Time   NA 141 03/29/2017 0855   NA 141 03/01/2017 1007   K 4.1 03/29/2017 0855   K 4.0 03/01/2017 1007   CL 109 03/29/2017 0855   CO2 23 03/29/2017 0855   CO2 21 (L) 03/01/2017 1007   BUN 25 03/29/2017 0855   BUN 26.9 (H) 03/01/2017 1007   CREATININE 0.82 03/29/2017 0855   CREATININE 0.8 03/01/2017 1007      Component Value Date/Time   CALCIUM 9.3 03/29/2017 0855   CALCIUM 9.2 03/01/2017 1007   ALKPHOS 86 03/29/2017 0855   ALKPHOS 85 03/01/2017 1007   AST 15 03/29/2017 0855   AST 13 03/01/2017 1007   ALT 18 03/29/2017 0855   ALT 16 03/01/2017 1007   BILITOT 0.4 03/29/2017 0855   BILITOT 0.37 03/01/2017 1007      ASSESSMENT & PLAN:  Endometrial cancer ([place]) She tolerated the treatment reasonably well but did have some mild nausea and mild neuropathy Her symptoms are much improved with reduced dose Taxol and gabapentin We will continue similar dose without further dose adjustment to complete  total of 6 cycles of chemotherapy.  Peripheral neuropathy due to chemotherapy [facility]) We will continue on reduced dose chemotherapy It is stable The patient would like to try a separate type of vitamin supplement and I have given her a prescription She has appointment to see new in March for second opinion  Chemotherapy-induced nausea She has intermittent chemotherapy-induced nausea, stable on anti-emetics I recommend she takes antiemetics on the regular basis a few days after each treatment  No orders of the defined types were placed in this encounter.  All questions were answered. The patient knows to call the clinic with any problems, questions or concerns. No barriers to learning was detected. I spent 15 minutes counseling the patient face to face. The total time spent in the appointment was 20 minutes and more than 50% was on counseling and review of test results     Heath Lark, MD 03/30/2017 10:39 AM

## 2017-03-30 NOTE — Assessment & Plan Note (Signed)
We will continue on reduced dose chemotherapy It is stable The patient would like to try a separate type of vitamin supplement and I have given her a prescription She has appointment to see new in March for second opinion

## 2017-03-30 NOTE — Assessment & Plan Note (Signed)
She has intermittent chemotherapy-induced nausea, stable on anti-emetics I recommend she takes antiemetics on the regular basis a few days after each treatment

## 2017-03-31 ENCOUNTER — Other Ambulatory Visit: Payer: Self-pay

## 2017-03-31 ENCOUNTER — Telehealth: Payer: Self-pay

## 2017-03-31 ENCOUNTER — Ambulatory Visit
Admission: RE | Admit: 2017-03-31 | Discharge: 2017-03-31 | Disposition: A | Discharge status: Home / Self Care | Payer: Medicare Other | Source: Ambulatory Visit | Attending: Radiation Oncology | Admitting: Radiation Oncology

## 2017-03-31 ENCOUNTER — Encounter: Payer: Self-pay | Admitting: Radiation Oncology

## 2017-03-31 ENCOUNTER — Inpatient Hospital Stay: Payer: Medicare Other

## 2017-03-31 VITALS — BP 123/57 | HR 81 | Temp 97.7°F | Resp 20 | Wt 157.4 lb

## 2017-03-31 DIAGNOSIS — R530 Neoplastic (malignant) related fatigue: Secondary | ICD-10-CM | POA: Insufficient documentation

## 2017-03-31 DIAGNOSIS — Y842 Radiological procedure and radiotherapy as the cause of abnormal reaction of the patient, or of later complication, without mention of misadventure at the time of the procedure: Secondary | ICD-10-CM | POA: Diagnosis not present

## 2017-03-31 DIAGNOSIS — Z79899 Other long term (current) drug therapy: Secondary | ICD-10-CM | POA: Diagnosis not present

## 2017-03-31 DIAGNOSIS — Z923 Personal history of irradiation: Secondary | ICD-10-CM | POA: Diagnosis not present

## 2017-03-31 DIAGNOSIS — C541 Malignant neoplasm of endometrium: Secondary | ICD-10-CM | POA: Insufficient documentation

## 2017-03-31 DIAGNOSIS — Z791 Long term (current) use of non-steroidal anti-inflammatories (NSAID): Secondary | ICD-10-CM | POA: Diagnosis not present

## 2017-03-31 MED ORDER — PEGFILGRASTIM INJECTION 6 MG/0.6ML ~~LOC~~
PREFILLED_SYRINGE | SUBCUTANEOUS | Status: AC
  Filled 2017-03-31: qty 0.6

## 2017-03-31 MED ORDER — PEGFILGRASTIM INJECTION 6 MG/0.6ML ~~LOC~~
6.0000 mg | PREFILLED_SYRINGE | Freq: Once | SUBCUTANEOUS | Status: AC
  Administered 2017-03-31: 6 mg via SUBCUTANEOUS

## 2017-03-31 NOTE — Progress Notes (Addendum)
Ms. Pete is here for her one month follow -up appointment. Patient denies any pain.Patient states that she has mild fatigue. Denies any nausea or vomiting.Denies any issues with her bowels.Denies any skin irration.Denies any dysuria. Denies any vaginal or rectal bleeding. Denies any vaginal discharge. Vitals:   03/31/17 1105  BP: (!) 123/57  Pulse: 81  Resp: 20  Temp: 97.7 F (36.5 C)  TempSrc: Oral  SpO2: 99%  Weight: 157 lb 6 oz (71.4 kg)   Wt Readings from Last 3 Encounters:  03/31/17 157 lb 6 oz (71.4 kg)  03/29/17 154 lb 14.4 oz (70.3 kg)  03/01/17 156 lb 4.8 oz (70.9 kg)

## 2017-03-31 NOTE — Patient Instructions (Signed)
Pegfilgrastim injection What is this medicine? PEGFILGRASTIM (PEG fil gra stim) is a long-acting granulocyte colony-stimulating factor that stimulates the growth of neutrophils, a type of white blood cell important in the body's fight against infection. It is used to reduce the incidence of fever and infection in patients with certain types of cancer who are receiving chemotherapy that affects the bone marrow, and to increase survival after being exposed to high doses of radiation. This medicine may be used for other purposes; ask your health care provider or pharmacist if you have questions. COMMON BRAND NAME(S): Neulasta What should I tell my health care provider before I take this medicine? They need to know if you have any of these conditions: -kidney disease -latex allergy -ongoing radiation therapy -sickle cell disease -skin reactions to acrylic adhesives (On-Body Injector only) -an unusual or allergic reaction to pegfilgrastim, filgrastim, other medicines, foods, dyes, or preservatives -pregnant or trying to get pregnant -breast-feeding How should I use this medicine? This medicine is for injection under the skin. If you get this medicine at home, you will be taught how to prepare and give the pre-filled syringe or how to use the On-body Injector. Refer to the patient Instructions for Use for detailed instructions. Use exactly as directed. Tell your healthcare provider immediately if you suspect that the On-body Injector may not have performed as intended or if you suspect the use of the On-body Injector resulted in a missed or partial dose. It is important that you put your used needles and syringes in a special sharps container. Do not put them in a trash can. If you do not have a sharps container, call your pharmacist or healthcare provider to get one. Talk to your pediatrician regarding the use of this medicine in children. While this drug may be prescribed for selected conditions,  precautions do apply. Overdosage: If you think you have taken too much of this medicine contact a poison control center or emergency room at once. NOTE: This medicine is only for you. Do not share this medicine with others. What if I miss a dose? It is important not to miss your dose. Call your doctor or health care professional if you miss your dose. If you miss a dose due to an On-body Injector failure or leakage, a new dose should be administered as soon as possible using a single prefilled syringe for manual use. What may interact with this medicine? Interactions have not been studied. Give your health care provider a list of all the medicines, herbs, non-prescription drugs, or dietary supplements you use. Also tell them if you smoke, drink alcohol, or use illegal drugs. Some items may interact with your medicine. This list may not describe all possible interactions. Give your health care provider a list of all the medicines, herbs, non-prescription drugs, or dietary supplements you use. Also tell them if you smoke, drink alcohol, or use illegal drugs. Some items may interact with your medicine. What should I watch for while using this medicine? You may need blood work done while you are taking this medicine. If you are going to need a MRI, CT scan, or other procedure, tell your doctor that you are using this medicine (On-Body Injector only). What side effects may I notice from receiving this medicine? Side effects that you should report to your doctor or health care professional as soon as possible: -allergic reactions like skin rash, itching or hives, swelling of the face, lips, or tongue -dizziness -fever -pain, redness, or irritation at site   where injected -pinpoint red spots on the skin -red or dark-brown urine -shortness of breath or breathing problems -stomach or side pain, or pain at the shoulder -swelling -tiredness -trouble passing urine or change in the amount of urine Side  effects that usually do not require medical attention (report to your doctor or health care professional if they continue or are bothersome): -bone pain -muscle pain This list may not describe all possible side effects. Call your doctor for medical advice about side effects. You may report side effects to FDA at 1-800-FDA-1088. Where should I keep my medicine? Keep out of the reach of children. Store pre-filled syringes in a refrigerator between 2 and 8 degrees C (36 and 46 degrees F). Do not freeze. Keep in carton to protect from light. Throw away this medicine if it is left out of the refrigerator for more than 48 hours. Throw away any unused medicine after the expiration date. NOTE: This sheet is a summary. It may not cover all possible information. If you have questions about this medicine, talk to your doctor, pharmacist, or health care provider.  2018 Elsevier/Gold Standard (2016-03-04 12:58:03)  

## 2017-03-31 NOTE — Progress Notes (Signed)
Patient was given size S and S+ vaginal dilators and was educated to use them 3 times a week for 10 minutes at a time.

## 2017-03-31 NOTE — Telephone Encounter (Signed)
Patient called after hours number regarding Neulasta Onpro Malfunctioning last pm. Still flashing red, she thinks she got half a dose. Instructed patient to come in at 1130 for injection appt. To bring Onpro with her, nurse will evaluate onpro and take to pharmacy. Verbalized understanding.

## 2017-03-31 NOTE — Progress Notes (Signed)
Radiation Oncology         [phone] ________________________________  Name: [name] MRN: [id number]  Date: 03/31/2017  DOB: June 01, 1950  Follow-Up Visit Note  CC: [name], NP  [name], NP    ICD-10-CM   1. Endometrial cancer ([place]) C54.1     Diagnosis:   Endometrial adenocarcinoma, FIGO grade III, stage pT1b,snpN1a  Interval Since Last Radiation:  1 month, she completed vaginal brachytherapy alone, 30 gray in 5 fractions directed at the vaginal cuff area  Narrative:  The patient returns today for routine follow-up.  The patient completed her 5th cycle of chemo on Tuesday and will be done with systemic therapy on 04/19/17. She is not yet scheduled with Dr. Denman George.     On review of systems, the patient denies pain but endorses mild fatigue. She also endorses increased urinary frequency and urgency during treatment but these symptoms have since subsided. She denies nausea, vomiting, bowel issues, skin irritation, vaginal or rectal bleeding, and vaginal discharge.                         ALLERGIES:  has No Known Allergies.  Meds: Current Outpatient Medications  Medication Sig Dispense Refill  . dexamethasone (DECADRON) 4 MG tablet Take 5 tabs the day before chemo and 5 tabs the morning of chemo, every 3 weeks x 6 60 tablet 1  . gabapentin (NEURONTIN) 300 MG capsule Take 1 capsule (300 mg total) by mouth 3 (three) times daily. 90 capsule 9  . ibuprofen (ADVIL,MOTRIN) 200 MG tablet Take 600 mg by mouth 2 (two) times daily as needed for moderate pain.    Marland Kitchen l-methylfolate-B6-B12 (METANX) 3-35-2 MG TABS tablet Take 1 tablet by mouth daily. 90 tablet 1  . levothyroxine (SYNTHROID, LEVOTHROID) 137 MCG tablet TAKE ONE TABLET DAILY IN THE MORNING 1/2 HOUR PRIOR TO FOOD  5  . lidocaine-prilocaine (EMLA) cream Apply to affected area once 30 g 3  . ondansetron (ZOFRAN) 8 MG tablet Take 1 tablet (8 mg total) by mouth every 8 (eight) hours as needed for refractory nausea /  vomiting. Start on day 3 after chemo. 30 tablet 1  . OVER THE COUNTER MEDICATION Take 5 tablets by mouth 3 (three) times a week. Pyridoxal 1, 2, 3, and 4 otc supplement  Vitamin package    . OVER THE COUNTER MEDICATION Take 1 tablet by mouth as directed. Sulfur 24 g7 otc supplement take 1 tablet daily 5 days out of the week  Adrenaline gland support    . OVER THE COUNTER MEDICATION Take 1 capsule by mouth See admin instructions. Carbon 90 bx otc supplement take 1 capsule daily 5 days out of the week  Omega 3 supplement    . prochlorperazine (COMPAZINE) 10 MG tablet Take 1 tablet (10 mg total) by mouth every 6 (six) hours as needed (Nausea or vomiting). 30 tablet 1   No current facility-administered medications for this encounter.     Physical Findings: The patient is in no acute distress. Patient is alert and oriented. Lungs are clear to auscultation bilaterally. Heart has regular rate and rhythm. No palpable cervical, supraclavicular, or axillary adenopathy. Abdomen soft, non-tender, normal bowel sounds. Pelvic exam deferred in light of her recent completion of treatment.   weight is 157 lb 6 oz (71.4 kg). Her oral temperature is 97.7 F (36.5 C). Her blood pressure is 123/57 (abnormal) and her pulse is 81. Her respiration is 20 and oxygen saturation is  99%. .  No significant changes.  Lab Findings: Lab Results  Component Value Date   WBC 5.6 03/29/2017   HGB 11.7 03/29/2017   HCT 35.0 03/29/2017   MCV 102.3 (H) 03/29/2017   PLT 162 03/29/2017    Radiographic Findings: No results found.  Impression:  The patient is recovering from the effects of radiation.   Plan:  I will schedule a follow up with Dr. Denman George in 2 months and the patient will then f/u with me again about 5 months from now. She was given a vaginal dilator and instructions on its use. ____________________________________ Blair Promise, MD  This document serves as a record of services personally performed by Gery Pray, MD. It was created on his behalf by Linward Natal, a trained medical scribe. The creation of this record is based on the scribe's personal observations and the provider's statements to them. This document has been checked and approved by the attending provider.

## 2017-03-31 NOTE — Progress Notes (Addendum)

## 2017-04-13 ENCOUNTER — Telehealth: Payer: Self-pay | Admitting: *Deleted

## 2017-04-13 NOTE — Telephone Encounter (Signed)
There is risk of infections even with dental cleaning. Recommend defer until after chemo is done

## 2017-04-13 NOTE — Telephone Encounter (Signed)
Instructed to wait 1 month after chemo for dental cleaning

## 2017-04-13 NOTE — Telephone Encounter (Signed)
[name] left a message stating her last chemo is 1/29. Wants to know when she will be OK to go to the dentist. Is overdue for cleaning.

## 2017-04-19 ENCOUNTER — Inpatient Hospital Stay: Payer: Medicare Other

## 2017-04-19 ENCOUNTER — Encounter: Payer: Self-pay | Admitting: Hematology and Oncology

## 2017-04-19 ENCOUNTER — Inpatient Hospital Stay (HOSPITAL_BASED_OUTPATIENT_CLINIC_OR_DEPARTMENT_OTHER): Payer: Medicare Other | Admitting: Hematology and Oncology

## 2017-04-19 DIAGNOSIS — T380X5A Adverse effect of glucocorticoids and synthetic analogues, initial encounter: Secondary | ICD-10-CM | POA: Diagnosis not present

## 2017-04-19 DIAGNOSIS — C541 Malignant neoplasm of endometrium: Secondary | ICD-10-CM

## 2017-04-19 DIAGNOSIS — R739 Hyperglycemia, unspecified: Secondary | ICD-10-CM | POA: Diagnosis not present

## 2017-04-19 DIAGNOSIS — Z923 Personal history of irradiation: Secondary | ICD-10-CM | POA: Diagnosis not present

## 2017-04-19 DIAGNOSIS — Z79899 Other long term (current) drug therapy: Secondary | ICD-10-CM

## 2017-04-19 DIAGNOSIS — Z95828 Presence of other vascular implants and grafts: Secondary | ICD-10-CM

## 2017-04-19 DIAGNOSIS — T451X5A Adverse effect of antineoplastic and immunosuppressive drugs, initial encounter: Secondary | ICD-10-CM

## 2017-04-19 DIAGNOSIS — G629 Polyneuropathy, unspecified: Secondary | ICD-10-CM

## 2017-04-19 DIAGNOSIS — G620 Drug-induced polyneuropathy: Secondary | ICD-10-CM

## 2017-04-19 LAB — COMPREHENSIVE METABOLIC PANEL
ALT: 16 U/L (ref 0–55)
AST: 14 U/L (ref 5–34)
Albumin: 4.2 g/dL (ref 3.5–5.0)
Alkaline Phosphatase: 83 U/L (ref 40–150)
Anion gap: 9 (ref 3–11)
BUN: 29 mg/dL — ABNORMAL HIGH (ref 7–26)
CO2: 24 mmol/L (ref 22–29)
Calcium: 9.4 mg/dL (ref 8.4–10.4)
Chloride: 108 mmol/L (ref 98–109)
Creatinine, Ser: 0.76 mg/dL (ref 0.60–1.10)
GFR calc Af Amer: >60 mL/min (ref >60)
GFR calc non Af Amer: >60 mL/min (ref >60)
Glucose, Bld: 158 mg/dL — ABNORMAL HIGH (ref 70–140)
Potassium: 4.1 mmol/L (ref 3.3–4.7)
Sodium: 141 mmol/L (ref 136–145)
Total Bilirubin: 0.3 mg/dL (ref 0.2–1.2)
Total Protein: 6.9 g/dL (ref 6.4–8.3)

## 2017-04-19 LAB — CBC WITH DIFFERENTIAL/PLATELET
Basophils Absolute: 0 10*3/uL (ref 0.0–0.1)
Basophils Relative: 0 %
Eosinophils Absolute: 0 10*3/uL (ref 0.0–0.5)
Eosinophils Relative: 0 %
HCT: 33.7 % — ABNORMAL LOW (ref 34.8–46.6)
Hemoglobin: 11.3 g/dL — ABNORMAL LOW (ref 11.6–15.9)
Lymphocytes Relative: 7 %
Lymphs Abs: 0.6 10*3/uL — ABNORMAL LOW (ref 0.9–3.3)
MCH: 34.9 pg — ABNORMAL HIGH (ref 25.1–34.0)
MCHC: 33.5 g/dL (ref 31.5–36.0)
MCV: 104 fL — ABNORMAL HIGH (ref 79.5–101.0)
Monocytes Absolute: 0.1 10*3/uL (ref 0.1–0.9)
Monocytes Relative: 1 %
Neutro Abs: 8.3 10*3/uL — ABNORMAL HIGH (ref 1.5–6.5)
Neutrophils Relative %: 92 %
Platelets: 146 10*3/uL (ref 145–400)
RBC: 3.24 MIL/uL — ABNORMAL LOW (ref 3.70–5.45)
RDW: 13.6 % (ref 11.2–16.1)
WBC: 9 10*3/uL (ref 3.9–10.3)

## 2017-04-19 MED ORDER — FAMOTIDINE IN NACL 20-0.9 MG/50ML-% IV SOLN
INTRAVENOUS | Status: AC
  Filled 2017-04-19: qty 50

## 2017-04-19 MED ORDER — PEGFILGRASTIM 6 MG/0.6ML ~~LOC~~ PSKT
6.0000 mg | PREFILLED_SYRINGE | Freq: Once | SUBCUTANEOUS | Status: AC
  Administered 2017-04-19: 6 mg via SUBCUTANEOUS

## 2017-04-19 MED ORDER — HEPARIN SOD (PORK) LOCK FLUSH 100 UNIT/ML IV SOLN
500.0000 [IU] | Freq: Once | INTRAVENOUS | Status: AC | PRN
  Administered 2017-04-19: 500 [IU]
  Filled 2017-04-19: qty 5

## 2017-04-19 MED ORDER — PALONOSETRON HCL INJECTION 0.25 MG/5ML
INTRAVENOUS | Status: AC
  Filled 2017-04-19: qty 5

## 2017-04-19 MED ORDER — PEGFILGRASTIM 6 MG/0.6ML ~~LOC~~ PSKT
PREFILLED_SYRINGE | SUBCUTANEOUS | Status: AC
  Filled 2017-04-19: qty 0.6

## 2017-04-19 MED ORDER — SODIUM CHLORIDE 0.9 % IV SOLN
530.0000 mg | Freq: Once | INTRAVENOUS | Status: AC
  Administered 2017-04-19: 530 mg via INTRAVENOUS
  Filled 2017-04-19: qty 53

## 2017-04-19 MED ORDER — SODIUM CHLORIDE 0.9% FLUSH
10.0000 mL | INTRAVENOUS | Status: DC | PRN
  Administered 2017-04-19: 10 mL via INTRAVENOUS
  Filled 2017-04-19: qty 10

## 2017-04-19 MED ORDER — PALONOSETRON HCL INJECTION 0.25 MG/5ML
0.2500 mg | Freq: Once | INTRAVENOUS | Status: AC
  Administered 2017-04-19: 0.25 mg via INTRAVENOUS

## 2017-04-19 MED ORDER — DIPHENHYDRAMINE HCL 50 MG/ML IJ SOLN
50.0000 mg | Freq: Once | INTRAMUSCULAR | Status: AC
  Administered 2017-04-19: 50 mg via INTRAVENOUS

## 2017-04-19 MED ORDER — DIPHENHYDRAMINE HCL 50 MG/ML IJ SOLN
INTRAMUSCULAR | Status: AC
  Filled 2017-04-19: qty 1

## 2017-04-19 MED ORDER — FAMOTIDINE IN NACL 20-0.9 MG/50ML-% IV SOLN
20.0000 mg | Freq: Once | INTRAVENOUS | Status: AC
  Administered 2017-04-19: 20 mg via INTRAVENOUS

## 2017-04-19 MED ORDER — PACLITAXEL CHEMO INJECTION 300 MG/50ML
140.0000 mg/m2 | Freq: Once | INTRAVENOUS | Status: AC
  Administered 2017-04-19: 264 mg via INTRAVENOUS
  Filled 2017-04-19: qty 44

## 2017-04-19 MED ORDER — SODIUM CHLORIDE 0.9 % IV SOLN
Freq: Once | INTRAVENOUS | Status: AC
  Administered 2017-04-19: 12:00:00 via INTRAVENOUS
  Filled 2017-04-19: qty 5

## 2017-04-19 MED ORDER — SODIUM CHLORIDE 0.9% FLUSH
10.0000 mL | INTRAVENOUS | Status: DC | PRN
  Administered 2017-04-19: 10 mL
  Filled 2017-04-19: qty 10

## 2017-04-19 NOTE — Patient Instructions (Signed)
[facility] Discharge Instructions for Patients Receiving Chemotherapy  Today you received the following chemotherapy agents Taxol and Carboplatin.   To help prevent nausea and vomiting after your treatment, we encourage you to take your nausea medication as prescribed.    If you develop nausea and vomiting that is not controlled by your nausea medication, call the clinic.   BELOW ARE SYMPTOMS THAT SHOULD BE REPORTED IMMEDIATELY:  *FEVER GREATER THAN 100.5 F  *CHILLS WITH OR WITHOUT FEVER  NAUSEA AND VOMITING THAT IS NOT CONTROLLED WITH YOUR NAUSEA MEDICATION  *UNUSUAL SHORTNESS OF BREATH  *UNUSUAL BRUISING OR BLEEDING  TENDERNESS IN MOUTH AND THROAT WITH OR WITHOUT PRESENCE OF ULCERS  *URINARY PROBLEMS  *BOWEL PROBLEMS  UNUSUAL RASH Items with * indicate a potential emergency and should be followed up as soon as possible.  Feel free to call the clinic should you have any questions or concerns. The clinic phone number is [phone].  Please show the CHEMO ALERT CARD at check-in to the Emergency Department and triage nurse.   

## 2017-04-19 NOTE — Assessment & Plan Note (Signed)
She tolerated the treatment reasonably well but did have some mild nausea and mild neuropathy Her symptoms are much improved with reduced dose Taxol and gabapentin We will continue similar dose without further dose adjustment to complete total of 6 cycles of chemotherapy. I plan to order port removal within 3 weeks after she finished her treatment She will continue future follow-up with GYN oncologist

## 2017-04-19 NOTE — Progress Notes (Signed)
[facility] OFFICE PROGRESS NOTE  Patient Care Team: [name], NP as PCP - General (Obstetrics and Gynecology)  SUMMARY OF ONCOLOGIC HISTORY: Oncology History   MSI stable     Endometrial cancer ([place])   10/15/2016 Pathology Results    Endometrium, resection - ENDOMETRIOID ADENOCARCINOMA. - SEE COMMENT. Microscopic Comment The carcinoma appears FIGO grade II. There are foci of adenocarcinoma involving smooth muscle.       10/15/2016 Surgery    Preoperative diagnosis: Postmenopausal bleeding, endometrial mass/ thick endometrial stripe.                                       Cervical stenosis    Postop diagnosis: Same.   Procedure: Hysteroscopic resection of endometrial mass with Myosure Lite and dilatation and curettage Anesthesia General  Surgeon: [name], MD  IV fluids: LR 1 liter Estimated blood loss : 100 cc  Saline Fluid deficit: 1060 cc  Urine output: straight catheter preop 25 cc  Complications none  Condition stable  Disposition PACU  Specimen: Endometrial mass resected tissue, endometrial curetting, endometrial fluid      11/23/2016 Pathology Results    1. Lymph node, sentinel, biopsy, right external iliac - NO CARCINOMA IDENTIFIED IN ONE LYMPH NODE (0/1) - ENDOSALPINGIOSIS 2. Lymph node, sentinel, biopsy, left external iliac - METASTATIC CARCINOMA INVOLVING ONE LYMPH NODE 1/1 - ENDOSALPINGIOSIS 3. Uterus +/- tubes/ovaries, neoplastic, and left ovary, cervix UTERUS:    - ENDOMETRIOID ADENOCARCINOMA, FIGO GRADE 3 OF 3 - LEIOMYOMATA (1.6 CM; LARGEST) - ADENOMYOSIS CERVIX: - NO CARCINOMA IDENTIFIED LEFT OVARY: - NO CARCINOMA IDENTIFIED Microscopic Comment 3. ONCOLOGY TABLE-UTERUS, CARCINOMA OR CARCINOSARCOMA Specimen: Uterus, cervix, left ovary, and sentinel lymph nodes Procedure: Total hysterectomy, left oophorectomy and sentinel lymph node biopsies Maximum tumor size: 6 cm Histologic type: Endometrioid carcinoma, not otherwise  specified Grade: FIGO 3 Myometrial invasion: 1.3 cm where myometrium is 1.9 cm in thickness Cervical stromal involvement: Not identified Extent of involvement of other organs: Not identified Lymph - vascular invasion: Not identified Peritoneal washings: Not submitted/found Lymph nodes: Examined: 2 Sentinel 0 Non-sentinel 2 Total Lymph nodes with metastasis: 1 Isolated tumor cells (< 0.2 mm): 0 Micrometastasis: (> 0.2 mm and < 2.0 mm): 0 Macrometastasis: (> 2.0 mm): 1 Extracapsular extension: Not identified TNM code: pT1b, [id number] FIGO Stage (based on pathologic findings, needs clinical correlation): IIIC1      11/23/2016 Surgery    Operation: Robotic-assisted laparoscopic total hysterectomy with left salpingoophorectomy,  Sentinel lymph node biopsy.  Surgeon: [name]  Assistant Surgeon: Lahoma Crocker MD Operative Findings:  : 6cm normal appearing uterus. 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 No suspcious nodes. Adhesions between left ovary and left ovarian fossa.       12/09/2016 PET scan    Recent hysterectomy with small amount of free fluid in pelvis. No evidence of the residual or metastatic carcinoma.  Single 9 mm left level 2A cervical lymph node with low-grade metabolic activity, which is nonspecific but likely reactive in etiology.  Incidentally noted sigmoid diverticulosis and cholelithiasis.      12/23/2016 Procedure    Placement of a subcutaneous port device      12/27/2016 -  Chemotherapy    She received carboplatin and Taxol      01/18/2017 - 02/23/2017 Radiation Therapy    Radiation treatment dates:   01/18/17, 01/27/17, 02/09/17, 02/16/17, 02/23/17  Site/dose:  Vaginal cuff / 30 Gy delivered in 5 fractions  Beams/energy:   HDR Ir-192 Vaginal        INTERVAL HISTORY: Please see below for problem oriented charting. She returns for cycle 6 of therapy She tolerated last treatment well Denies nausea or  vomiting Her peripheral neuropathy remains stable She started going to the gym to exercise.  REVIEW OF SYSTEMS:   Constitutional: Denies fevers, chills or abnormal weight loss Eyes: Denies blurriness of vision Ears, nose, mouth, throat, and face: Denies mucositis or sore throat Respiratory: Denies cough, dyspnea or wheezes Cardiovascular: Denies palpitation, chest discomfort or lower extremity swelling Gastrointestinal:  Denies nausea, heartburn or change in bowel habits Skin: Denies abnormal skin rashes Lymphatics: Denies new lymphadenopathy or easy bruising Neurological:Denies numbness, tingling or new weaknesses Behavioral/Psych: Mood is stable, no new changes  All other systems were reviewed with the patient and are negative.  I have reviewed the past medical history, past surgical history, social history and family history with the patient and they are unchanged from previous note.  ALLERGIES:  has No Known Allergies.  MEDICATIONS:  Current Outpatient Medications  Medication Sig Dispense Refill  . gabapentin (NEURONTIN) 300 MG capsule Take 1 capsule (300 mg total) by mouth 3 (three) times daily. 90 capsule 9  . ibuprofen (ADVIL,MOTRIN) 200 MG tablet Take 600 mg by mouth 2 (two) times daily as needed for moderate pain.    Marland Kitchen l-methylfolate-B6-B12 (METANX) 3-35-2 MG TABS tablet Take 1 tablet by mouth daily. 90 tablet 1  . levothyroxine (SYNTHROID, LEVOTHROID) 137 MCG tablet TAKE ONE TABLET DAILY IN THE MORNING 1/2 HOUR PRIOR TO FOOD  5  . ondansetron (ZOFRAN) 8 MG tablet Take 1 tablet (8 mg total) by mouth every 8 (eight) hours as needed for refractory nausea / vomiting. Start on day 3 after chemo. 30 tablet 1  . OVER THE COUNTER MEDICATION Take 5 tablets by mouth 3 (three) times a week. Pyridoxal 1, 2, 3, and 4 otc supplement  Vitamin package    . OVER THE COUNTER MEDICATION Take 1 tablet by mouth as directed. Sulfur 24 g7 otc supplement take 1 tablet daily 5 days out of the week   Adrenaline gland support    . OVER THE COUNTER MEDICATION Take 1 capsule by mouth See admin instructions. Carbon 90 bx otc supplement take 1 capsule daily 5 days out of the week  Omega 3 supplement    . prochlorperazine (COMPAZINE) 10 MG tablet Take 1 tablet (10 mg total) by mouth every 6 (six) hours as needed (Nausea or vomiting). 30 tablet 1   No current facility-administered medications for this visit.    Facility-Administered Medications Ordered in Other Visits  Medication Dose Route Frequency Provider Last Rate Last Dose  . CARBOplatin (PARAPLATIN) 530 mg in sodium chloride 0.9 % 250 mL chemo infusion  530 mg Intravenous Once Heath Lark, MD 606 mL/hr at 04/19/17 1553 530 mg at 04/19/17 1553  . heparin lock flush 100 unit/mL  500 Units Intracatheter Once PRN Berel Najjar, MD      . pegfilgrastim (NEULASTA ONPRO KIT) injection 6 mg  6 mg Subcutaneous Once Raevon Broom, MD      . sodium chloride flush (NS) 0.9 % injection 10 mL  10 mL Intracatheter PRN Alvy Bimler, Dalene Robards, MD        PHYSICAL EXAMINATION: ECOG PERFORMANCE STATUS: 1 - Symptomatic but completely ambulatory  Vitals:   04/19/17 0944  BP: (!) 138/54  Pulse: 80  Resp: 18  Temp:  98.3 F (36.8 C)  SpO2: 98%   Filed Weights   04/19/17 0944  Weight: 155 lb 4.8 oz (70.4 kg)    GENERAL:alert, no distress and comfortable SKIN: skin color, texture, turgor are normal, no rashes or significant lesions EYES: normal, Conjunctiva are pink and non-injected, sclera clear OROPHARYNX:no exudate, no erythema and lips, buccal mucosa, and tongue normal  NECK: supple, thyroid normal size, non-tender, without nodularity LYMPH:  no palpable lymphadenopathy in the cervical, axillary or inguinal LUNGS: clear to auscultation and percussion with normal breathing effort HEART: regular rate & rhythm and no murmurs and no lower extremity edema ABDOMEN:abdomen soft, non-tender and normal bowel sounds Musculoskeletal:no cyanosis of digits and no  clubbing  NEURO: alert & oriented x 3 with fluent speech, no focal motor/sensory deficits  LABORATORY DATA:  I have reviewed the data as listed    Component Value Date/Time   NA 141 04/19/2017 0850   NA 141 03/01/2017 1007   K 4.1 04/19/2017 0850   K 4.0 03/01/2017 1007   CL 108 04/19/2017 0850   CO2 24 04/19/2017 0850   CO2 21 (L) 03/01/2017 1007   GLUCOSE 158 (H) 04/19/2017 0850   GLUCOSE 208 (H) 03/01/2017 1007   BUN 29 (H) 04/19/2017 0850   BUN 26.9 (H) 03/01/2017 1007   CREATININE 0.76 04/19/2017 0850   CREATININE 0.8 03/01/2017 1007   CALCIUM 9.4 04/19/2017 0850   CALCIUM 9.2 03/01/2017 1007   PROT 6.9 04/19/2017 0850   PROT 7.2 03/01/2017 1007   ALBUMIN 4.2 04/19/2017 0850   ALBUMIN 4.2 03/01/2017 1007   AST 14 04/19/2017 0850   AST 13 03/01/2017 1007   ALT 16 04/19/2017 0850   ALT 16 03/01/2017 1007   ALKPHOS 83 04/19/2017 0850   ALKPHOS 85 03/01/2017 1007   BILITOT 0.3 04/19/2017 0850   BILITOT 0.37 03/01/2017 1007   GFRNONAA >60 04/19/2017 0850   GFRAA >60 04/19/2017 0850    No results found for: SPEP, UPEP  Lab Results  Component Value Date   WBC 9.0 04/19/2017   NEUTROABS 8.3 (H) 04/19/2017   HGB 11.3 (L) 04/19/2017   HCT 33.7 (L) 04/19/2017   MCV 104.0 (H) 04/19/2017   PLT 146 04/19/2017      Chemistry      Component Value Date/Time   NA 141 04/19/2017 0850   NA 141 03/01/2017 1007   K 4.1 04/19/2017 0850   K 4.0 03/01/2017 1007   CL 108 04/19/2017 0850   CO2 24 04/19/2017 0850   CO2 21 (L) 03/01/2017 1007   BUN 29 (H) 04/19/2017 0850   BUN 26.9 (H) 03/01/2017 1007   CREATININE 0.76 04/19/2017 0850   CREATININE 0.8 03/01/2017 1007      Component Value Date/Time   CALCIUM 9.4 04/19/2017 0850   CALCIUM 9.2 03/01/2017 1007   ALKPHOS 83 04/19/2017 0850   ALKPHOS 85 03/01/2017 1007   AST 14 04/19/2017 0850   AST 13 03/01/2017 1007   ALT 16 04/19/2017 0850   ALT 16 03/01/2017 1007   BILITOT 0.3 04/19/2017 0850   BILITOT 0.37 03/01/2017  1007       ASSESSMENT & PLAN:  Endometrial cancer ([place]) She tolerated the treatment reasonably well but did have some mild nausea and mild neuropathy Her symptoms are much improved with reduced dose Taxol and gabapentin We will continue similar dose without further dose adjustment to complete total of 6 cycles of chemotherapy. I plan to order port removal within 3 weeks after she  finished her treatment She will continue future follow-up with GYN oncologist  Peripheral neuropathy due to chemotherapy [facility]) We will continue on reduced dose chemotherapy It is stable The patient would like to try a separate type of vitamin supplement and I have given her a prescription She has appointment to see neurologist in March for second opinion  Steroid-induced hyperglycemia She has mild steroid-induced hyperglycemia I suspect it would improve after discontinuation of treatment.   No orders of the defined types were placed in this encounter.  All questions were answered. The patient knows to call the clinic with any problems, questions or concerns. No barriers to learning was detected. I spent 15 minutes counseling the patient face to face. The total time spent in the appointment was 20 minutes and more than 50% was on counseling and review of test results     Heath Lark, MD 04/19/2017 3:54 PM

## 2017-04-19 NOTE — Assessment & Plan Note (Signed)
She has mild steroid-induced hyperglycemia I suspect it would improve after discontinuation of treatment.

## 2017-04-19 NOTE — Assessment & Plan Note (Signed)
We will continue on reduced dose chemotherapy It is stable The patient would like to try a separate type of vitamin supplement and I have given her a prescription She has appointment to see neurologist in March for second opinion

## 2017-04-20 ENCOUNTER — Telehealth: Payer: Self-pay | Admitting: Hematology and Oncology

## 2017-04-20 NOTE — Telephone Encounter (Signed)
No new orders or visits per 1/29 los.

## 2017-05-03 ENCOUNTER — Other Ambulatory Visit: Payer: Self-pay | Admitting: Hematology and Oncology

## 2017-05-03 DIAGNOSIS — C541 Malignant neoplasm of endometrium: Secondary | ICD-10-CM

## 2017-05-19 ENCOUNTER — Other Ambulatory Visit: Payer: Self-pay | Admitting: Radiology

## 2017-05-20 ENCOUNTER — Encounter (HOSPITAL_COMMUNITY): Payer: Self-pay

## 2017-05-20 ENCOUNTER — Other Ambulatory Visit: Payer: Self-pay | Admitting: Hematology and Oncology

## 2017-05-20 ENCOUNTER — Ambulatory Visit (HOSPITAL_COMMUNITY)
Admission: RE | Admit: 2017-05-20 | Discharge: 2017-05-20 | Disposition: A | Discharge status: Home / Self Care | Payer: Medicare Other | Source: Ambulatory Visit | Attending: Hematology and Oncology | Admitting: Hematology and Oncology

## 2017-05-20 DIAGNOSIS — Z452 Encounter for adjustment and management of vascular access device: Secondary | ICD-10-CM | POA: Insufficient documentation

## 2017-05-20 DIAGNOSIS — Z85850 Personal history of malignant neoplasm of thyroid: Secondary | ICD-10-CM | POA: Diagnosis not present

## 2017-05-20 DIAGNOSIS — Z9221 Personal history of antineoplastic chemotherapy: Secondary | ICD-10-CM | POA: Diagnosis not present

## 2017-05-20 DIAGNOSIS — E039 Hypothyroidism, unspecified: Secondary | ICD-10-CM | POA: Insufficient documentation

## 2017-05-20 DIAGNOSIS — C541 Malignant neoplasm of endometrium: Secondary | ICD-10-CM

## 2017-05-20 DIAGNOSIS — Z8542 Personal history of malignant neoplasm of other parts of uterus: Secondary | ICD-10-CM | POA: Insufficient documentation

## 2017-05-20 DIAGNOSIS — G629 Polyneuropathy, unspecified: Secondary | ICD-10-CM | POA: Insufficient documentation

## 2017-05-20 HISTORY — PX: IR REMOVAL TUN ACCESS W/ PORT W/O FL MOD SED: IMG2290

## 2017-05-20 LAB — CBC WITH DIFFERENTIAL/PLATELET
Basophils Absolute: 0 10*3/uL (ref 0.0–0.1)
Basophils Relative: 1 %
Eosinophils Absolute: 0.1 10*3/uL (ref 0.0–0.7)
Eosinophils Relative: 2 %
HCT: 34.3 % — ABNORMAL LOW (ref 36.0–46.0)
Hemoglobin: 11.6 g/dL — ABNORMAL LOW (ref 12.0–15.0)
Lymphocytes Relative: 35 %
Lymphs Abs: 1.3 10*3/uL (ref 0.7–4.0)
MCH: 34.7 pg — ABNORMAL HIGH (ref 26.0–34.0)
MCHC: 33.8 g/dL (ref 30.0–36.0)
MCV: 102.7 fL — ABNORMAL HIGH (ref 78.0–100.0)
Monocytes Absolute: 0.4 10*3/uL (ref 0.1–1.0)
Monocytes Relative: 10 %
Neutro Abs: 2 10*3/uL (ref 1.7–7.7)
Neutrophils Relative %: 52 %
Platelets: 161 10*3/uL (ref 150–400)
RBC: 3.34 MIL/uL — ABNORMAL LOW (ref 3.87–5.11)
RDW: 13.1 % (ref 11.5–15.5)
WBC: 3.8 10*3/uL — ABNORMAL LOW (ref 4.0–10.5)

## 2017-05-20 LAB — PROTIME-INR
INR: 0.91
Prothrombin Time: 12.2 seconds (ref 11.4–15.2)

## 2017-05-20 MED ORDER — FENTANYL CITRATE (PF) 100 MCG/2ML IJ SOLN
INTRAMUSCULAR | Status: AC
  Filled 2017-05-20: qty 2

## 2017-05-20 MED ORDER — MIDAZOLAM HCL 2 MG/2ML IJ SOLN
INTRAMUSCULAR | Status: AC
  Filled 2017-05-20: qty 2

## 2017-05-20 MED ORDER — CEFAZOLIN SODIUM-DEXTROSE 2-4 GM/100ML-% IV SOLN
INTRAVENOUS | Status: AC
  Administered 2017-05-20: 2 g via INTRAVENOUS
  Filled 2017-05-20: qty 100

## 2017-05-20 MED ORDER — CEFAZOLIN SODIUM-DEXTROSE 2-4 GM/100ML-% IV SOLN
2.0000 g | INTRAVENOUS | Status: AC
  Administered 2017-05-20: 2 g via INTRAVENOUS

## 2017-05-20 MED ORDER — LIDOCAINE HCL 1 % IJ SOLN
INTRAMUSCULAR | Status: AC | PRN
  Administered 2017-05-20: 10 mL

## 2017-05-20 MED ORDER — HEPARIN SOD (PORK) LOCK FLUSH 100 UNIT/ML IV SOLN
INTRAVENOUS | Status: AC
  Filled 2017-05-20: qty 5

## 2017-05-20 MED ORDER — SODIUM CHLORIDE 0.9 % IV SOLN
INTRAVENOUS | Status: DC
  Administered 2017-05-20: 08:00:00 via INTRAVENOUS

## 2017-05-20 MED ORDER — MIDAZOLAM HCL 2 MG/2ML IJ SOLN
INTRAMUSCULAR | Status: AC | PRN
  Administered 2017-05-20: 0.5 mg via INTRAVENOUS
  Administered 2017-05-20: 1 mg via INTRAVENOUS

## 2017-05-20 MED ORDER — FENTANYL CITRATE (PF) 100 MCG/2ML IJ SOLN
INTRAMUSCULAR | Status: AC | PRN
  Administered 2017-05-20: 50 ug via INTRAVENOUS
  Administered 2017-05-20: 25 ug via INTRAVENOUS

## 2017-05-20 MED ORDER — LIDOCAINE HCL 1 % IJ SOLN
INTRAMUSCULAR | Status: AC
  Filled 2017-05-20: qty 20

## 2017-05-20 NOTE — Discharge Instructions (Signed)
Implanted Port Removal, Care After °Refer to this sheet in the next few weeks. These instructions provide you with information about caring for yourself after your procedure. Your health care provider may also give you more specific instructions. Your treatment has been planned according to current medical practices, but problems sometimes occur. Call your health care provider if you have any problems or questions after your procedure. °What can I expect after the procedure? °After the procedure, it is common to have: °· Soreness or pain near your incision. °· Some swelling or bruising near your incision. ° °Follow these instructions at home: °Medicines °· Take over-the-counter and prescription medicines only as told by your health care provider. °· If you were prescribed an antibiotic medicine, take it as told by your health care provider. Do not stop taking the antibiotic even if you start to feel better. °Bathing °· Do not take baths, swim, or use a hot tub until your health care provider approves. Ask your health care provider if you can take showers. You may only be allowed to take sponge baths for bathing. °Incision care °· Follow instructions from your health care provider about how to take care of your incision. Make sure you: °? Wash your hands with soap and water before you change your bandage (dressing). If soap and water are not available, use hand sanitizer. °? Change your dressing as told by your health care provider. °? Keep your dressing dry. °? Leave stitches (sutures), skin glue, or adhesive strips in place. These skin closures may need to stay in place for 2 weeks or longer. If adhesive strip edges start to loosen and curl up, you may trim the loose edges. Do not remove adhesive strips completely unless your health care provider tells you to do that. °· Check your incision area every day for signs of infection. Check for: °? More redness, swelling, or pain. °? More fluid or  blood. °? Warmth. °? Pus or a bad smell. °Driving °· If you received a sedative, do not drive for 24 hours after the procedure. °· If you did not receive a sedative, ask your health care provider when it is safe to drive. °Activity °· Return to your normal activities as told by your health care provider. Ask your health care provider what activities are safe for you. °· Until your health care provider says it is safe: °? Do not lift anything that is heavier than 10 lb (4.5 kg). °? Do not do activities that involve lifting your arms over your head. °General instructions °· Do not use any tobacco products, such as cigarettes, chewing tobacco, and e-cigarettes. Tobacco can delay healing. If you need help quitting, ask your health care provider. °· Keep all follow-up visits as told by your health care provider. This is important. °Contact a health care provider if: °· You have more redness, swelling, or pain around your incision. °· You have more fluid or blood coming from your incision. °· Your incision feels warm to the touch. °· You have pus or a bad smell coming from your incision. °· You have a fever. °· You have pain that is not relieved by your pain medicine. °Get help right away if: °· You have chest pain. °· You have difficulty breathing. °This information is not intended to replace advice given to you by your health care provider. Make sure you discuss any questions you have with your health care provider. °Document Released: 02/17/2015 Document Revised: 08/14/2015 Document Reviewed: 12/11/2014 °Elsevier Interactive Patient   Education © 2018 [employer] °Moderate Conscious Sedation, Adult, Care After °These instructions provide you with information about caring for yourself after your procedure. Your health care provider may also give you more specific instructions. Your treatment has been planned according to current medical practices, but problems sometimes occur. Call your health care provider if you have  any problems or questions after your procedure. °What can I expect after the procedure? °After your procedure, it is common: °· To feel sleepy for several hours. °· To feel clumsy and have poor balance for several hours. °· To have poor judgment for several hours. °· To vomit if you eat too soon. ° °Follow these instructions at home: °For at least 24 hours after the procedure: ° °· Do not: °? Participate in activities where you could fall or become injured. °? Drive. °? Use heavy machinery. °? Drink alcohol. °? Take sleeping pills or medicines that cause drowsiness. °? Make important decisions or sign legal documents. °? Take care of children on your own. °· Rest. °Eating and drinking °· Follow the diet recommended by your health care provider. °· If you vomit: °? Drink water, juice, or soup when you can drink without vomiting. °? Make sure you have little or no nausea before eating solid foods. °General instructions °· Have a responsible adult stay with you until you are awake and alert. °· Take over-the-counter and prescription medicines only as told by your health care provider. °· If you smoke, do not smoke without supervision. °· Keep all follow-up visits as told by your health care provider. This is important. °Contact a health care provider if: °· You keep feeling nauseous or you keep vomiting. °· You feel light-headed. °· You develop a rash. °· You have a fever. °Get help right away if: °· You have trouble breathing. °This information is not intended to replace advice given to you by your health care provider. Make sure you discuss any questions you have with your health care provider. °Document Released: 12/27/2012 Document Revised: 08/11/2015 Document Reviewed: 06/28/2015 °Elsevier Interactive Patient Education © 2018 [employer] ° °

## 2017-05-20 NOTE — Procedures (Signed)
Interventional Radiology Procedure Note  Procedure: Port removal  Complications: None  Estimated Blood Loss: < 10 mL  Findings: Right chest SL PAC removed in entirety.  Pocket clean; wound closed.  Venetia Night. Kathlene Cote, M.D Pager:  [phone]

## 2017-05-20 NOTE — Sedation Documentation (Signed)
Patient is resting comfortably. Opens eyes occasionally but quickly closes eyes. MD suturing port pocket closed at this time

## 2017-05-20 NOTE — H&P (Signed)
Chief Complaint: Endometrial Cancer  Referring Physician(s): [place]  Supervising Physician: Aletta Edouard  Patient Status: [facility] - Out-pt  History of Present Illness: [name] is a 67 y.o. female who is here today for removal of her Port A Cath.  She had it placed on 12/23/2016 by Dr. Anselm Pancoast.  She has completed chemotherapy for endometrial cancer and is requesting removal.  She is NPO. No blood thinners.  She feels well today.   Past Medical History:  Diagnosis Date  . Cancer ([place]) 1998   thyroid ca  . Degenerative arthritis of toe joint, right    great toe  . Endometrial cancer ([place])   . Endometrial mass 10/15/2016  . History of radiation therapy 01/18/17-02/23/17   vaginal cuff 30 Gy in 5 fractions  . Hypothyroidism   . Neuromuscular disorder (HCC)    neuropathy  . Neuropathy    feet  . Postmenopausal bleeding 10/15/2016    Past Surgical History:  Procedure Laterality Date  . ABDOMINAL HYSTERECTOMY    . ABDOMINOPLASTY    . APPENDECTOMY    . BUNIONECTOMY WITH CHILECTOMY Right 03/03/2016   Procedure: Lillard Anes WITH CHILECTOMY;  Surgeon: [name], MD;  Location: [place];  Service: Orthopedics;  Laterality: Right;  . CHEILECTOMY Right 03/03/2016   Procedure: CHEILECTOMY RIGHT GREAT TOE;  Surgeon: [name], MD;  Location: [place];  Service: Orthopedics;  Laterality: Right;  . COLONOSCOPY    . COSMETIC SURGERY    . DILATATION & CURETTAGE/HYSTEROSCOPY WITH MYOSURE N/A 10/15/2016   Procedure: DILATATION & CURETTAGE/HYSTEROSCOPY WITH MYOSURE;  Surgeon: [name], MD;  Location: [facility];  Service: Gynecology;  Laterality: N/A;  Requests Microdilators and Lacrimal Duct Dilators  . IR FLUORO GUIDE PORT INSERTION RIGHT  12/23/2016  . IR US GUIDE VASC ACCESS RIGHT  12/23/2016  . OVARIAN CYST REMOVAL  1976  . ROBOTIC ASSISTED BILATERAL SALPINGO OOPHERECTOMY Bilateral 11/23/2016   Procedure: XI ROBOTIC ASSISTED LEFT  OOPHORECTOMY;  Surgeon: [name], MD;  [facility];  Service: Gynecology;  Laterality: Bilateral;  . ROBOTIC ASSISTED TOTAL HYSTERECTOMY Bilateral 11/23/2016   Procedure: XI ROBOTIC ASSISTED TOTAL HYSTERECTOMY;  Surgeon: [name], MD;  [facility];  Service: Gynecology;  Laterality: Bilateral;  . SENTINEL NODE BIOPSY Bilateral 11/23/2016   Procedure: SENTINEL NODE BIOPSY;  Surgeon: [name], MD;  [facility];  Service: Gynecology;  Laterality: Bilateral;  . SHOULDER SURGERY Left 03/2015   by dr [name] at [place]  04/1996    Allergies: Patient has no known allergies.  Medications: Prior to Admission medications   Medication Sig Start Date End Date Taking? Authorizing Provider  ibuprofen (ADVIL,MOTRIN) 200 MG tablet Take 600 mg by mouth 2 (two) times daily as needed for moderate pain.   Yes [provider]  levothyroxine (SYNTHROID, LEVOTHROID) 137 MCG tablet TAKE ONE TABLET DAILY IN THE MORNING 1/2 HOUR PRIOR TO FOOD 11/01/16  Yes [provider]  OVER THE COUNTER MEDICATION Take 5 tablets by mouth 3 (three) times a week. Pyridoxal 1, 2, 3, and 4 otc supplement  Vitamin package   Yes [provider]  OVER THE COUNTER MEDICATION Take 1 tablet by mouth as directed. Sulfur 24 g7 otc supplement take 1 tablet daily 5 days out of the week  Adrenaline gland support   Yes [provider]  OVER THE COUNTER MEDICATION Take 1 capsule by mouth See admin instructions. Carbon 90 bx otc supplement take 1 capsule daily  5 days out of the week  Omega 3 supplement   Yes [provider]  gabapentin (NEURONTIN) 300 MG capsule Take 1 capsule (300 mg total) by mouth 3 (three) times daily. 01/17/17   Heath Lark, MD  l-methylfolate-B6-B12 (METANX) 3-35-2 MG TABS tablet Take 1 tablet by mouth daily. 03/29/17   Heath Lark, MD  ondansetron (ZOFRAN) 8 MG tablet Take 1 tablet (8 mg total) by mouth every 8 (eight) hours as needed for refractory  nausea / vomiting. Start on day 3 after chemo. 12/20/16   Heath Lark, MD  prochlorperazine (COMPAZINE) 10 MG tablet Take 1 tablet (10 mg total) by mouth every 6 (six) hours as needed (Nausea or vomiting). 12/20/16   Heath Lark, MD     Family History  Problem Relation Age of Onset  . Breast cancer Sister     Social History   Socioeconomic History  . Marital status: Married    Spouse name: Joe  . Number of children: 0  . Years of education: None  . Highest education level: None  Social Needs  . Financial resource strain: None  . Food insecurity - worry: None  . Food insecurity - inability: None  . Transportation needs - medical: None  . Transportation needs - non-medical: None  Occupational History  . Occupation: retired  Tobacco Use  . Smoking status: Never Smoker  . Smokeless tobacco: Never Used  Substance and Sexual Activity  . Alcohol use: Yes    Comment: social  . Drug use: No  . Sexual activity: None  Other Topics Concern  . None  Social History Narrative  . None   Review of Systems: A 12 point ROS discussedReview of Systems  Constitutional: Negative.   HENT: Negative.   Respiratory: Negative.   Cardiovascular: Negative.   Gastrointestinal: Negative.   Genitourinary: Negative.   Musculoskeletal: Negative.   Skin: Negative.   Neurological: Negative.   Hematological: Negative.   Psychiatric/Behavioral: Negative.     Vital Signs: BP (!) 116/54 (BP Location: Right Arm)   Pulse 72   Temp 97.9 F (36.6 C) (Oral)   Resp 18   SpO2 100%   Physical Exam  Constitutional: She is oriented to person, place, and time. She appears well-developed.  HENT:  Head: Normocephalic and atraumatic.  Eyes: EOM are normal.  Neck: Normal range of motion.  Cardiovascular: Normal rate, regular rhythm and normal heart sounds.  Pulmonary/Chest: Effort normal and breath sounds normal.  Abdominal: Soft.  Musculoskeletal: Normal range of motion.  Neurological: She is alert and  oriented to person, place, and time.  Skin: Skin is warm and dry.  Psychiatric: She has a normal mood and affect. Her behavior is normal. Judgment and thought content normal.  Vitals reviewed.   Imaging: No results found.  Labs:  CBC: Recent Labs    02/07/17 0949 03/01/17 1007 03/29/17 0855 04/19/17 0850  WBC 5.3 8.7 5.6 9.0  HGB 11.2* 11.8 11.7 11.3*  HCT 34.0* 35.5 35.0 33.7*  PLT 159 172 162 146    COAGS: Recent Labs    12/23/16 1030  INR 0.92    BMP: Recent Labs    11/24/16 0429 12/23/16 1030  02/07/17 0949 03/01/17 1007 03/29/17 0855 04/19/17 0850  NA 140 140   < > 139 141 141 141  K 4.3 4.2   < > 4.1 4.0 4.1 4.1  CL 110 109  --   --   --  109 108  CO2 24 24   < >  23 21* 23 24  GLUCOSE 145* 93   < > 104 208* 207* 158*  BUN 17 23*   < > 23.2 26.9* 25 29*  CALCIUM 8.7* 9.0   < > 8.8 9.2 9.3 9.4  CREATININE 0.64 0.66   < > 0.7 0.8 0.82 0.76  GFRNONAA >60 >60  --   --   --  >60 >60  GFRAA >60 >60  --   --   --  >60 >60   < > = values in this interval not displayed.    LIVER FUNCTION TESTS: Recent Labs    02/07/17 0949 03/01/17 1007 03/29/17 0855 04/19/17 0850  BILITOT 0.37 0.37 0.4 0.3  AST 19 13 15 14   ALT 17 16 18 16   ALKPHOS 87 85 86 83  PROT 6.9 7.2 7.0 6.9  ALBUMIN 3.9 4.2 4.2 4.2    TUMOR MARKERS: No results for input(s): AFPTM, CEA, CA199, CHROMGRNA in the last 8760 hours.  Assessment and Plan:  Endometrial Cancer  Completed Chemotherapy  Requesting removal of tunneled catheter with Port.  Will proceed today by Dr. Kathlene Cote.  Risks and benefits of image guided port-a-catheter placement was discussed with the patient including, but not limited to bleeding, infection, pneumothorax, or fibrin sheath development and need for additional procedures.  All of the patient's questions were answered, patient is agreeable to proceed. Consent signed and in chart.  Thank you for this interesting consult.  I greatly enjoyed meeting [name] and look forward to participating in their care.  A copy of this report was sent to the requesting provider on this date.  Electronically Signed: Murrell Redden, PA-C 05/20/2017, 8:15 AM   I spent a total of  25 Minutes in face to face in clinical consultation, greater than 50% of which was counseling/coordinating care for removal of Port A Cath.

## 2017-05-31 ENCOUNTER — Telehealth: Payer: Self-pay | Admitting: *Deleted

## 2017-05-31 NOTE — Telephone Encounter (Signed)
Patient called back and left a message to call her. Returned call and left a message to call the office tomorrow

## 2017-05-31 NOTE — Telephone Encounter (Signed)
Attempted to return the patient's call. Left message that we have cancelled her appt for tomorrow, per her request. Asked that the patient call the office back to reschedule her appt.

## 2017-06-01 ENCOUNTER — Ambulatory Visit: Payer: Medicare Other | Admitting: Gynecologic Oncology

## 2017-06-06 ENCOUNTER — Telehealth: Payer: Self-pay | Admitting: *Deleted

## 2017-06-06 NOTE — Telephone Encounter (Signed)
Returned the patient's call and rescheduled an appt for April 10th.

## 2017-06-15 ENCOUNTER — Encounter: Payer: Self-pay | Admitting: Neurology

## 2017-06-15 ENCOUNTER — Ambulatory Visit: Payer: Medicare Other | Admitting: Neurology

## 2017-06-15 VITALS — BP 118/78 | HR 81 | Ht 69.0 in | Wt 154.4 lb

## 2017-06-15 DIAGNOSIS — T451X5A Adverse effect of antineoplastic and immunosuppressive drugs, initial encounter: Secondary | ICD-10-CM | POA: Diagnosis not present

## 2017-06-15 DIAGNOSIS — G620 Drug-induced polyneuropathy: Secondary | ICD-10-CM

## 2017-06-15 NOTE — Progress Notes (Signed)
[facility] Clinic Note - Initial Visit   Date: 06/15/17  [name] MRN: [id number] DOB: Apr 23, 1950   Dear Dr. Ernst Spell:  Thank you for your kind referral of [name] for consultation of neuropathy. Although her history is well known to you, please allow [place] to reiterate it for the purpose of our medical record. The patient was accompanied to the clinic by self.    History of Present Illness: [name] is a 67 y.o. right-handed Caucasian female with endometrial cancer (2018) s/p total hysterectomy with left salpingoophorectomy and chemotherapy, history of thyroid disease, and neuropathy presenting for evaluation of worsening bilateral feet pain.    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  She was diagnosed with neuropathy in 2010 by NCS/EMG.  She was seen [facility] but did not return as she did not want to take any medications for her pain.  Symptoms mostly involved the soles of the feet and worse at night.    She was diagnosed with endometrial cancer in July 2018.  She had hysterectomy in September and started chemotherapy with carboplatin and taxol was started in October 2018 - January 2019.  In November, she began having burning sensation involving the top of the toes and developed weakness in the toes.  She denies any imbalance or falls.  Her feet burn more if she consumes anything with high fructose corn syrup. She tried gabapentin which made her feet worse, so discontinued it.   She has been seeing a wellness doctor, Dr. Mingo Amber, and takes several supplements including pyridoxal 1, 2, 3, 4; carbon 98 BX, sulfur 24G7 for various reasons.  She is doing TeslaMax through a chiropractors office which involves applying pads in the legs 45 minutes twice per day, costing her $1000/month.  She feels that this is helping and improved her pain by 25%.  She prefers non-traditional  therapies.   Out-side paper records, electronic medical record, and images have been reviewed where available and summarized as:  NCS/EMG of bilateral arms and legs 11/07/2013 (performed at [facility]):  Diffuse peripheral motor/sensory polyneuropathy mainly involving the lower extremity.  Compared to the result of 2010, the motor conduction study has been the same.  The sensory nerve conduction study demonstrated slightly progressively worse.   Labs 05/13/2017:  HbA1c 5.3   Past Medical History:  Diagnosis Date  . Cancer ([place]) 1998   thyroid ca  . Degenerative arthritis of toe joint, right    great toe  . Endometrial cancer ([place])   . Endometrial mass 10/15/2016  . History of radiation therapy 01/18/17-02/23/17   vaginal cuff 30 Gy in 5 fractions  . Hypothyroidism   . Neuromuscular disorder (HCC)    neuropathy  . Neuropathy    feet  . Postmenopausal bleeding 10/15/2016    Past Surgical History:  Procedure Laterality Date  . ABDOMINAL HYSTERECTOMY    . ABDOMINOPLASTY    . APPENDECTOMY    . BUNIONECTOMY WITH CHILECTOMY Right 03/03/2016   Procedure: [name] WITH CHILECTOMY;  Surgeon: [name], MD;  Location: [place];  Service: Orthopedics;  Laterality: Right;  . CHEILECTOMY Right 03/03/2016   Procedure: CHEILECTOMY RIGHT GREAT TOE;  Surgeon: [name], MD;  Location: [facility];  Service: Orthopedics;  Laterality: Right;  . COLONOSCOPY    . COSMETIC SURGERY    . DILATATION &  CURETTAGE/HYSTEROSCOPY WITH MYOSURE N/A 10/15/2016   Procedure: DILATATION & CURETTAGE/HYSTEROSCOPY WITH MYOSURE;  Surgeon: [name], MD;  Location: [facility];  Service: Gynecology;  Laterality: N/A;  Requests Microdilators and Lacrimal Duct Dilators  . IR FLUORO GUIDE PORT INSERTION RIGHT  12/23/2016  . IR REMOVAL TUN ACCESS W/ PORT W/O FL MOD SED  05/20/2017  . IR US GUIDE VASC ACCESS RIGHT  12/23/2016  . OVARIAN CYST REMOVAL  1976    . ROBOTIC ASSISTED BILATERAL SALPINGO OOPHERECTOMY Bilateral 11/23/2016   Procedure: XI ROBOTIC ASSISTED LEFT OOPHORECTOMY;  Surgeon: [name], MD;  [facility];  Service: Gynecology;  Laterality: Bilateral;  . ROBOTIC ASSISTED TOTAL HYSTERECTOMY Bilateral 11/23/2016   Procedure: XI ROBOTIC ASSISTED TOTAL HYSTERECTOMY;  Surgeon: [name], MD;  [facility];  Service: Gynecology;  Laterality: Bilateral;  . SENTINEL NODE BIOPSY Bilateral 11/23/2016   Procedure: SENTINEL NODE BIOPSY;  Surgeon: [name], MD;  [facility];  Service: Gynecology;  Laterality: Bilateral;  . SHOULDER SURGERY Left 03/2015   by dr [name] at [place]  04/1996     Medications:  Outpatient Encounter Medications as of 06/15/2017  Medication Sig Note  . ibuprofen (ADVIL,MOTRIN) 200 MG tablet Take by mouth 2 (two) times daily as needed for moderate pain.    Marland Kitchen levothyroxine (SYNTHROID, LEVOTHROID) 137 MCG tablet TAKE ONE TABLET DAILY IN THE MORNING 1/2 HOUR PRIOR TO FOOD 11/09/2016: Supposed to start new dose of thyroid medication 11-09-16  . NON FORMULARY [place]   . NON FORMULARY Carboveg   . NON FORMULARY [place]   . OVER THE COUNTER MEDICATION Take 5 tablets by mouth 3 (three) times a week. Pyridoxal 1, 2, 3, and 4 otc supplement  Vitamin package   . OVER THE COUNTER MEDICATION Take 1 tablet by mouth as directed. Sulfur 24 g7 otc supplement take 1 tablet daily 5 days out of the week  Adrenaline gland support   . OVER THE COUNTER MEDICATION Take 1 capsule by mouth See admin instructions. Carbon 90 bx otc supplement take 1 capsule daily 5 days out of the week  Omega 3 supplement   . [DISCONTINUED] gabapentin (NEURONTIN) 300 MG capsule Take 1 capsule (300 mg total) by mouth 3 (three) times daily. 05/20/2017: Using as needed.  . [DISCONTINUED] l-methylfolate-B6-B12 (METANX) 3-35-2 MG TABS tablet Take 1 tablet by mouth daily.   . [DISCONTINUED] prochlorperazine (COMPAZINE) 10 MG tablet Take 1 tablet  (10 mg total) by mouth every 6 (six) hours as needed (Nausea or vomiting). 12/23/2016: 10/4-has not started yet   No facility-administered encounter medications on file as of 06/15/2017.      Allergies: No Known Allergies  Family History: Family History  Problem Relation Age of Onset  . Neurologic Disorder Mother        Trigeminal neuralgia  . Alzheimer's disease Mother   . Diabetes Father   . Breast cancer Sister     Social History: Social History   Tobacco Use  . Smoking status: Never Smoker  . Smokeless tobacco: Never Used  Substance Use Topics  . Alcohol use: Yes    Comment: social, 2 glasses of wine per week  . 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   She lives with husband.  No children.   Highest level of education:  PhD in leadership studies, MBA    Review of Systems:  CONSTITUTIONAL: No fevers, chills, night  sweats, or weight loss.   EYES: No visual changes or eye pain ENT: No hearing changes.  No history of nose bleeds.   RESPIRATORY: No cough, wheezing and shortness of breath.   CARDIOVASCULAR: Negative for chest pain, and palpitations.   GI: Negative for abdominal discomfort, blood in stools or black stools.  No recent change in bowel habits.   GU:  No history of incontinence.   MUSCLOSKELETAL: No history of joint pain or swelling.  No myalgias.   SKIN: Negative for lesions, rash, and itching.   HEMATOLOGY/ONCOLOGY: Negative for prolonged bleeding, bruising easily, and swollen nodes.  +history of cancer.   ENDOCRINE: Negative for cold or heat intolerance, polydipsia or goiter.   PSYCH:  No depression or anxiety symptoms.   NEURO: As Above.   Vital Signs:  BP 118/78   Pulse 81   Ht 5\' 9"  (1.753 m)   Wt 154 lb 6 oz (70 kg)   SpO2 96%   BMI 22.80 kg/m    General Medical Exam:   General:  Well appearing, comfortable.   Eyes/ENT: see cranial nerve examination.   Neck: No  masses appreciated.  Full range of motion without tenderness.  No carotid bruits. Respiratory:  Clear to auscultation, good air entry bilaterally.   Cardiac:  Regular rate and rhythm, no murmur.   Extremities:  No deformities, edema, or skin discoloration.  Skin:  No rashes or lesions.  Neurological Exam: MENTAL STATUS including orientation to time, place, person, recent and remote memory, attention span and concentration, language, and fund of knowledge is normal.  Speech is not dysarthric.  CRANIAL NERVES: II:  No visual field defects.  Unremarkable fundi.   III-IV-VI: Pupils equal round and reactive to light.  Normal conjugate, extra-ocular eye movements in all directions of gaze.  No nystagmus.  No ptosis.   V:  Normal facial sensation.    VII:  Normal facial symmetry and movements.   VIII:  Normal hearing and vestibular function.   IX-X:  Normal palatal movement.   XI:  Normal shoulder shrug and head rotation.   XII:  Normal tongue strength and range of motion, no deviation or fasciculation.  MOTOR:  No atrophy, fasciculations or abnormal movements.  No pronator drift.  Tone is normal.    Right Upper Extremity:    Left Upper Extremity:    Deltoid  5/5   Deltoid  5/5   Biceps  5/5   Biceps  5/5   Triceps  5/5   Triceps  5/5   Wrist extensors  5/5   Wrist extensors  5/5   Wrist flexors  5/5   Wrist flexors  5/5   Finger extensors  5/5   Finger extensors  5/5   Finger flexors  5/5   Finger flexors  5/5   Dorsal interossei  5/5   Dorsal interossei  5/5   Abductor pollicis  5/5   Abductor pollicis  5/5   Tone (Ashworth scale)  0  Tone (Ashworth scale)  0   Right Lower Extremity:    Left Lower Extremity:    Hip flexors  5/5   Hip flexors  5/5   Hip extensors  5/5   Hip extensors  5/5   Knee flexors  5/5   Knee flexors  5/5   Knee extensors  5/5   Knee extensors  5/5   Dorsiflexors  5/5   Dorsiflexors  5/5   Plantarflexors  5/5   Plantarflexors  5/5   Toe  extensors  5/5   Toe  extensors  5/5   Toe flexors  5/5   Toe flexors  5/5   Tone (Ashworth scale)  0  Tone (Ashworth scale)  0   MSRs:  Right                                                                 Left brachioradialis 2+  brachioradialis 2+  biceps 2+  biceps 2+  triceps 2+  triceps 2+  patellar 2+  patellar 2+  ankle jerk 0  ankle jerk 0  Hoffman no  Hoffman no  plantar response down  plantar response down   SENSORY:  Reduced vibration to 80% at the ankles, 70% at the great toe.  Temperature and pin prick is reduced over the distal foot and toes.  Proprioception is intact.  Romberg's sign absent.   COORDINATION/GAIT: Normal finger-to- nose-finger.  Intact rapid alternating movements bilaterally.  Gait narrow based and stable. Tandem and stressed gait intact.    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  Her neurological examination shows a distal predominant large fiber sensory peripheral neuropathy. There is no weakness or ataxia.  I had extensive discussion with the patient regarding the pathogenesis, etiology, management, and natural course of neuropathy.  She has symptoms of neuropathy for several years prior to chemotherapy exposure which is most likely idiopathic.  She does not have history of diabetes or alcohol use.   I offered to check labs for secondary causes of neuropathy, but she is already taking a number of supplements, so unlikely to be nutritional in nature.  I did caution patient with taking vitamin B6 as toxicity can cause neuropathy.  She does not want to take any oral medications, and recommended OTC lidocaine ointment, which she can apply 1-2 times per day, directly to the soles of her feet.  I informed patient that I practice evidence-based medicine and none of the therapies she is seeking through her wellness provider or chiropractor are supported in the neurology literature for neuropathy.  She is certainly welcome to explore options at her own risk.  She  had many questions which were answered to the best of my ability.   Return to clinic as needed.  The duration of this appointment visit was 50 minutes of face-to-face time with the patient.  Greater than 50% of this time was spent in counseling, explanation of diagnosis, planning of further management, and coordination of care.   Thank you for allowing me to participate in patient's care.  If I can answer any additional questions, I would be pleased to do so.    Sincerely,    Zhaire Locker K. Posey Pronto, DO

## 2017-06-15 NOTE — Patient Instructions (Addendum)
You can try over the counter lidocaine ointment, such as Aspercream or Salonpas.  Return to clinic as needed

## 2017-06-17 ENCOUNTER — Ambulatory Visit: Payer: Medicare Other | Admitting: Neurology

## 2017-06-17 ENCOUNTER — Telehealth: Payer: Self-pay

## 2017-06-17 NOTE — Telephone Encounter (Signed)
Received message that patient had billing question.  Called and left message to call nurse back.

## 2017-06-24 ENCOUNTER — Telehealth: Payer: Self-pay | Admitting: *Deleted

## 2017-06-24 NOTE — Telephone Encounter (Signed)
Pt states her bill in January was much higher. OnPro malfunctioned on January 10th. Had to take neulasta injection at the [facility]. Has not been credited for the [employer].   RN spoke with pharmacy, they confirmed malfunction.  Email sent to [place] in billing.

## 2017-06-29 ENCOUNTER — Inpatient Hospital Stay: Payer: Medicare Other | Attending: Hematology and Oncology | Admitting: Gynecologic Oncology

## 2017-06-29 ENCOUNTER — Encounter: Payer: Self-pay | Admitting: Gynecologic Oncology

## 2017-06-29 VITALS — BP 116/55 | HR 75 | Temp 97.8°F | Resp 18 | Ht 69.0 in | Wt 154.0 lb

## 2017-06-29 DIAGNOSIS — Z8542 Personal history of malignant neoplasm of other parts of uterus: Secondary | ICD-10-CM | POA: Diagnosis not present

## 2017-06-29 DIAGNOSIS — C541 Malignant neoplasm of endometrium: Secondary | ICD-10-CM | POA: Insufficient documentation

## 2017-06-29 DIAGNOSIS — Z90722 Acquired absence of ovaries, bilateral: Secondary | ICD-10-CM

## 2017-06-29 DIAGNOSIS — T451X5A Adverse effect of antineoplastic and immunosuppressive drugs, initial encounter: Secondary | ICD-10-CM

## 2017-06-29 DIAGNOSIS — Z923 Personal history of irradiation: Secondary | ICD-10-CM | POA: Insufficient documentation

## 2017-06-29 DIAGNOSIS — Z90710 Acquired absence of both cervix and uterus: Secondary | ICD-10-CM | POA: Diagnosis not present

## 2017-06-29 DIAGNOSIS — Z9221 Personal history of antineoplastic chemotherapy: Secondary | ICD-10-CM

## 2017-06-29 DIAGNOSIS — G620 Drug-induced polyneuropathy: Secondary | ICD-10-CM

## 2017-06-29 DIAGNOSIS — T451X5S Adverse effect of antineoplastic and immunosuppressive drugs, sequela: Secondary | ICD-10-CM | POA: Insufficient documentation

## 2017-06-29 NOTE — Telephone Encounter (Signed)
LM for patient to let us know if she continues to have problems with the bill related to malfunctioned OnPRo

## 2017-06-29 NOTE — Patient Instructions (Signed)
Follow up in July or sooner if needed.

## 2017-06-29 NOTE — Progress Notes (Signed)
Follow-up Note: Gyn-Onc   [name] 67 y.o. female  Chief Complaint  Patient presents with  . Endometrial cancer, FIGO stage IIIC (HCC)    Assessment :Endometrioid adenocarcinoma the endometrium (grade 3), stage IIIC 1. MSI stable.   Plan:  S/p systemic therapy with 6 cycles of carboplatin and paclitaxel with vaginal brachytherapy to assist with local control with therapy completed in January, 2019.  Continue 3 monthly surveillance examinations until January, 2021.  HPI: 67 year old white married female seen in consultation at the request of [name] regarding management of a newly diagnosed endometrial carcinoma. The patient had some postmenopausal bleeding in December 2017 which by February had abated. Then returned more recently with heavier bleeding. Patient was found to have cervical stenosis and therefore underwent a formal D&C 10/15/2016. She was found to have a large endometrial polyp the grade 2 endometrial carcinoma. The patient's had an uncomplicated postoperative course. Patient has some past gynecologic history including a left oophorectomy in her teens for an ovarian cyst. 2006 she had bilateral salpingectomy for what I presume is hydrosalpinx. The patient is not on any hormone replacement therapy.  Interval Hx: On 11/23/16 she underwent robotic assisted total hysterectomy, BSO, SLN biopsy. Final pathology revealed a 6cm grade 3 endometrioid tumor with 1.3cm of 1.9cm myo invasion, no LVSI. Negative adnexae, but 1of 2 SLN's was positive for macrometastatic disease (from the left external iliac node).  In accordance with NCCN guidelines she was counseled to receive adjuvant chemotherapy and vaginal brachytherapy which she received between October. 2018 and January, 2019. 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 Additionally she has been taking natural supplements suppled by [name].   She  has no symptoms concerning for recurrence.   Review of Systems:10 point review of systems is negative except as noted in interval history.   Vitals: Blood pressure (!) 116/55, pulse 75, temperature 97.8 F (36.6 C), temperature source Oral, resp. rate 18, height _0  (1.753 m), weight 154 lb (69.9 kg), SpO2 100 %.  Physical Exam: General : The patient is a healthy woman in no acute distress.  HEENT: normocephalic, extraoccular movements normal; neck is supple without thyromegally  Lynphnodes: Supraclavicular and inguinal nodes not enlarged  Abdomen: Soft, non-tender, no ascites, no organomegally, no masses, no hernias, incisions well healed Pelvic:  EGBUS: Normal female  Vagina: normal vaginal cuff. No bleeding or lesions. No palpable masses.  Bi-manual examination: Non-tender; no adenxal masses or nodularity  Rectal: normal sphincter tone, no masses, no blood  Lower extremities: No edema or varicosities. Normal range of motion      No Known Allergies  Past Medical History:  Diagnosis Date  . Cancer ([place]) 1998   thyroid ca  . Degenerative arthritis of toe joint, right    great toe  . Endometrial cancer ([place])   . Endometrial mass 10/15/2016  . History of radiation therapy 01/18/17-02/23/17   vaginal cuff 30 Gy in 5 fractions  . Hypothyroidism   . Neuromuscular disorder (HCC)    neuropathy  . Neuropathy    feet  . Postmenopausal bleeding 10/15/2016    Past Surgical History:  Procedure Laterality Date  . ABDOMINAL HYSTERECTOMY    . ABDOMINOPLASTY    . APPENDECTOMY    . BUNIONECTOMY WITH CHILECTOMY Right 03/03/2016   Procedure: Lillard Anes WITH CHILECTOMY;  Surgeon: [name], MD;  Location: [facility];  Service: Orthopedics;  Laterality: Right;  . CHEILECTOMY  Right 03/03/2016   Procedure: CHEILECTOMY RIGHT GREAT TOE;  Surgeon: [name], MD;  Location: [facility];  Service: Orthopedics;  Laterality: Right;  . COLONOSCOPY    . COSMETIC  SURGERY    . DILATATION & CURETTAGE/HYSTEROSCOPY WITH MYOSURE N/A 10/15/2016   Procedure: DILATATION & CURETTAGE/HYSTEROSCOPY WITH MYOSURE;  Surgeon: [name], MD;  Location: [facility];  Service: Gynecology;  Laterality: N/A;  Requests Microdilators and Lacrimal Duct Dilators  . IR FLUORO GUIDE PORT INSERTION RIGHT  12/23/2016  . IR REMOVAL TUN ACCESS W/ PORT W/O FL MOD SED  05/20/2017  . IR US GUIDE VASC ACCESS RIGHT  12/23/2016  . OVARIAN CYST REMOVAL  1976  . ROBOTIC ASSISTED BILATERAL SALPINGO OOPHERECTOMY Bilateral 11/23/2016   Procedure: XI ROBOTIC ASSISTED LEFT OOPHORECTOMY;  Surgeon: [name], MD;  Location: [facility];  Service: Gynecology;  Laterality: Bilateral;  . ROBOTIC ASSISTED TOTAL HYSTERECTOMY Bilateral 11/23/2016   Procedure: XI ROBOTIC ASSISTED TOTAL HYSTERECTOMY;  Surgeon: [name], MD;  [facility];  Service: Gynecology;  Laterality: Bilateral;  . SENTINEL NODE BIOPSY Bilateral 11/23/2016   Procedure: SENTINEL NODE BIOPSY;  Surgeon: [name], MD;  [facility];  Service: Gynecology;  Laterality: Bilateral;  . SHOULDER SURGERY Left 03/2015   by dr [name] at [facility]  04/1996    Current Outpatient Medications  Medication Sig Dispense Refill  . ibuprofen (ADVIL,MOTRIN) 200 MG tablet Take by mouth 2 (two) times daily as needed for moderate pain.     Marland Kitchen levothyroxine (SYNTHROID, LEVOTHROID) 137 MCG tablet TAKE ONE TABLET DAILY IN THE MORNING 1/2 HOUR PRIOR TO FOOD  5  . NON FORMULARY Calphos    . NON FORMULARY Carboveg    . NON FORMULARY [place]    . OVER THE COUNTER MEDICATION Take 5 tablets by mouth 3 (three) times a week. Pyridoxal 1, 2, 3, and 4 otc supplement  Vitamin package    . OVER THE COUNTER MEDICATION Take 1 tablet by mouth as directed. Sulfur 24 g7 otc supplement take 1 tablet daily 5 days out of the week  Adrenaline gland support    . OVER THE COUNTER MEDICATION Take 1 capsule by mouth See admin instructions. Carbon 90 bx otc supplement take 1  capsule daily 5 days out of the week  Omega 3 supplement     No current facility-administered medications for this visit.     Social History   Socioeconomic History  . Marital status: Married    Spouse name: Joe  . Number of children: 0  . Years of education: 13  . Highest education level: Not on file  Occupational History  . Occupation: retired  Scientific laboratory technician  . Financial resource strain: Not on file  . Food insecurity:    Worry: Not on file    Inability: Not on file  . Transportation needs:    Medical: Not on file    Non-medical: Not on file  Tobacco Use  . Smoking status: Never Smoker  . Smokeless tobacco: Never Used  Substance and Sexual Activity  . Alcohol use: Yes    Comment: social, 2 glasses of wine per week  . Drug use: No  . Sexual activity: Not on file  Lifestyle  . Physical activity:    Days per week: Not on file    Minutes per session: Not on file  . Stress: Not on file  Relationships  . Social connections:    Talks on phone: Not on file  Gets together: Not on file    Attends religious service: Not on file    Active member of club or organization: Not on file    Attends meetings of clubs or organizations: Not on file    Relationship status: Not on file  . Intimate partner violence:    Fear of current or ex partner: Not on file    Emotionally abused: Not on file    Physically abused: Not on file    Forced sexual activity: Not on file  Other Topics Concern  . 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  No children.   Highest level of education:  PhD in leadership studies, MBA    Family History  Problem Relation Age of Onset  . Neurologic Disorder Mother        Trigeminal neuralgia  . Alzheimer's disease Mother   . Diabetes Father   . Breast cancer Sister     Thereasa Solo, MD 06/29/2017, 3:29 PM

## 2017-09-01 ENCOUNTER — Other Ambulatory Visit: Payer: Self-pay

## 2017-09-01 ENCOUNTER — Encounter: Payer: Self-pay | Admitting: Radiation Oncology

## 2017-09-01 ENCOUNTER — Ambulatory Visit
Admission: RE | Admit: 2017-09-01 | Discharge: 2017-09-01 | Disposition: A | Discharge status: Home / Self Care | Payer: Medicare Other | Source: Ambulatory Visit | Attending: Radiation Oncology | Admitting: Radiation Oncology

## 2017-09-01 VITALS — BP 113/63 | HR 67 | Temp 97.6°F | Resp 20 | Ht 69.0 in | Wt 156.6 lb

## 2017-09-01 DIAGNOSIS — C541 Malignant neoplasm of endometrium: Secondary | ICD-10-CM

## 2017-09-01 DIAGNOSIS — Z08 Encounter for follow-up examination after completed treatment for malignant neoplasm: Secondary | ICD-10-CM | POA: Diagnosis not present

## 2017-09-01 DIAGNOSIS — Z8542 Personal history of malignant neoplasm of other parts of uterus: Secondary | ICD-10-CM | POA: Insufficient documentation

## 2017-09-01 DIAGNOSIS — R5383 Other fatigue: Secondary | ICD-10-CM | POA: Insufficient documentation

## 2017-09-01 DIAGNOSIS — G629 Polyneuropathy, unspecified: Secondary | ICD-10-CM | POA: Diagnosis not present

## 2017-09-01 NOTE — Progress Notes (Signed)
Radiation Oncology         [phone] ________________________________  Name: [name] MRN: [id number]  Date: 09/01/2017  DOB: 1950-12-19  Follow-Up Visit Note  CC: [name], NP  [name], MD    ICD-10-CM   1. Endometrial cancer ([employer] [place]) C54.1     Diagnosis:   Endometrial adenocarcinoma, FIGO grade III, stage pT1b,snpN1a  Interval since radiation therapy: 6 months  Radiation treatment dates:   01/18/17, 01/27/17, 02/09/17, 02/16/17, 02/23/17  Site/dose:   Vaginal cuff / 30 Gy delivered in 5 fractions  Narrative:  The patient returns today for routine follow-up. She has been doing well overall. She has been playing bridge, golfing, and working out twice a week. She reports that she still has fatigue and that she intermittently has to take naps. She is using the vaginal dilator without any issues. She last saw Dr. Denman George in April, 2019.    She has started to volunteer at the [facility] with her first day back being on yesterday.   On review of systems, she reports fatigue, more neuropathy s/p chemo isolated to the bottom of her feet,  No vaginal bleeding, vaginal discharge, or vaginal pain. She denies her neuropathy affecting her life she . she denies any other symptoms. Pertinent positives are listed and detailed within the above HPI.                         ALLERGIES:  has No Known Allergies.  Meds: Current Outpatient Medications  Medication Sig Dispense Refill  . ibuprofen (ADVIL,MOTRIN) 200 MG tablet Take by mouth 2 (two) times daily as needed for moderate pain.     Marland Kitchen levothyroxine (SYNTHROID, LEVOTHROID) 137 MCG tablet TAKE ONE TABLET DAILY IN THE MORNING 1/2 HOUR PRIOR TO FOOD  5  . NON FORMULARY Calphos    . NON FORMULARY Carboveg    . NON FORMULARY [place]    . OVER THE COUNTER MEDICATION Take 5 tablets by mouth 3 (three) times a week. Pyridoxal 1, 2, 3, and 4 otc supplement  Vitamin package    . OVER THE COUNTER MEDICATION Take 1 tablet by  mouth as directed. Sulfur 24 g7 otc supplement take 1 tablet daily 5 days out of the week  Adrenaline gland support    . OVER THE COUNTER MEDICATION Take 1 capsule by mouth See admin instructions. Carbon 90 bx otc supplement take 1 capsule daily 5 days out of the week  Omega 3 supplement     No current facility-administered medications for this encounter.     Physical Findings:  height is 5\' 9"  (1.753 m) and weight is 156 lb 9.6 oz (71 kg). Her oral temperature is 97.6 F (36.4 C). Her blood pressure is 113/63 and her pulse is 67. Her respiration is 20 and oxygen saturation is 98%. .   The patient is in no acute distress. Patient is alert and oriented. Lungs are clear to auscultation bilaterally. Heart has regular rate and rhythm. No palpable cervical, supraclavicular, or axillary adenopathy. Abdomen soft, non-tender, normal bowel sounds. On pelvic examination the external genitalia were unremarkable. A speculum exam was performed. There are no mucosal lesions noted in the vaginal vault. On bimanual and rectovaginal examination there were no pelvic masses appreciated. Vaginal cuff intact.  Lab Findings: Lab Results  Component Value Date   WBC 3.8 (L) 05/20/2017   HGB 11.6 (L) 05/20/2017   HCT 34.3 (L) 05/20/2017   MCV 102.7 (H)  05/20/2017   PLT 161 05/20/2017    Radiographic Findings: No results found.  Impression:  No evidence of recurrence on clinical exam   Plan:  Patient will follow up in radiation oncology in 6 months and follow up with Dr. Denman George in 3 months.  ___________________________________ Blair Promise, MD  This document serves as a record of services personally performed by Gery Pray, MD. It was created on his behalf by [facility], a trained medical scribe. The creation of this record is based on the scribe's personal observations and the provider's statements to them. This document has been checked and approved by the attending provider.

## 2017-09-01 NOTE — Progress Notes (Signed)
Pt here for follow up appointment with Dr. Sondra Come. Pt states mild fatigue. Denies c/o pain. Pt states no vaginal discharge or bleeding. Denies rectal bleeding. Denies N/V. Denies diarrhea or constipation. Is using vaginal dilator 3 times per week. Finished chemotherapy in January 2019. No skin issues.   Blood pressure 113/63, pulse 67, temperature 97.6 F (36.4 C), temperature source Oral, resp. rate 20, height 5\' 9"  (1.753 m), weight 156 lb 9.6 oz (71 kg), SpO2 98 %.  Wt Readings from Last 3 Encounters:  09/01/17 156 lb 9.6 oz (71 kg)  06/29/17 154 lb (69.9 kg)  06/15/17 154 lb 6 oz (70 kg)

## 2017-09-28 ENCOUNTER — Ambulatory Visit: Payer: Medicare Other | Admitting: Gynecologic Oncology

## 2017-12-16 ENCOUNTER — Encounter: Payer: Self-pay | Admitting: Gynecologic Oncology

## 2017-12-16 ENCOUNTER — Inpatient Hospital Stay: Payer: Medicare Other | Attending: Gynecologic Oncology | Admitting: Gynecologic Oncology

## 2017-12-16 VITALS — BP 118/57 | HR 71 | Temp 98.7°F | Resp 18 | Ht 69.0 in | Wt 156.6 lb

## 2017-12-16 DIAGNOSIS — C541 Malignant neoplasm of endometrium: Secondary | ICD-10-CM

## 2017-12-16 DIAGNOSIS — Z90722 Acquired absence of ovaries, bilateral: Secondary | ICD-10-CM | POA: Insufficient documentation

## 2017-12-16 DIAGNOSIS — Z90710 Acquired absence of both cervix and uterus: Secondary | ICD-10-CM | POA: Diagnosis not present

## 2017-12-16 DIAGNOSIS — Z9221 Personal history of antineoplastic chemotherapy: Secondary | ICD-10-CM | POA: Insufficient documentation

## 2017-12-16 DIAGNOSIS — Z923 Personal history of irradiation: Secondary | ICD-10-CM | POA: Diagnosis not present

## 2017-12-16 NOTE — Progress Notes (Signed)
Follow-up Note: Gyn-Onc   [name] 67 y.o. female  Chief Complaint  Patient presents with  . Endometrial cancer, FIGO stage IIIC (HCC)    Assessment :Endometrioid adenocarcinoma the endometrium (grade 3), stage IIIC 1. MSI stable.  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   Plan:  S/p systemic therapy with 6 cycles of carboplatin and paclitaxel with vaginal brachytherapy to assist with local control with therapy completed in January, 2019.  Continue 3 monthly surveillance examinations until January, 2021.  HPI: 67 year old white married female seen in consultation at the request of [name] regarding management of a newly diagnosed endometrial carcinoma. The patient had some postmenopausal bleeding in December 2017 which by February had abated. Then returned more recently with heavier bleeding. Patient was found to have cervical stenosis and therefore underwent a formal D&C 10/15/2016. She was found to have a large endometrial polyp the grade 2 endometrial carcinoma. The patient's had an uncomplicated postoperative course. Patient has some past gynecologic history including a left oophorectomy in her teens for an ovarian cyst. 2006 she had bilateral salpingectomy for what I presume is hydrosalpinx. The patient is not on any hormone replacement therapy.  On 11/23/16 she underwent robotic assisted total hysterectomy, BSO, SLN biopsy. Final pathology revealed a 6cm grade 3 endometrioid tumor with 1.3cm of 1.9cm myo invasion, no LVSI. Negative adnexae, but 1of 2 SLN's was positive for macrometastatic disease (from the left external iliac node).  In accordance with NCCN guidelines she was counseled to receive adjuvant chemotherapy and vaginal brachytherapy which she received between October. 2018 and January, 2019. 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  Of interest, her neuropathy first developed in 2010 after treatment with  antibiotics for a pelvic infection. After chemotherapy in 2018 it became worse.   Interval Hx: She has been taking natural supplements suppled by [name].   She has no symptoms concerning for recurrence.   She has worsening lower extremity distal neuropathy (in soles of feet).   Review of Systems:10 point review of systems is negative except as noted in interval history.   Vitals: Blood pressure (!) 118/57, pulse 71, temperature 98.7 F (37.1 C), temperature source Oral, resp. rate 18, height '5\' 9"'  (1.753 m), weight 156 lb 9.6 oz (71 kg), SpO2 97 %.  Physical Exam: General : The patient is a healthy woman in no acute distress.  HEENT: normocephalic, extraoccular movements normal; neck is supple without thyromegally  Lynphnodes: Supraclavicular and inguinal nodes not enlarged  Abdomen: Soft, non-tender, no ascites, no organomegally, no masses, no hernias, incisions well healed Pelvic:  EGBUS: Normal female  Vagina: normal vaginal cuff. No bleeding or lesions. No palpable masses.  Bi-manual examination: Non-tender; no adenxal masses or nodularity  Rectal: normal sphincter tone, no masses, no blood  Lower extremities: No edema or varicosities. Normal range of motion      No Known Allergies  Past Medical History:  Diagnosis Date  . Cancer ([place]) 1998   thyroid ca  . Degenerative arthritis of toe joint, right    great toe  . Endometrial cancer ([place])   . Endometrial mass 10/15/2016  . History of radiation therapy 01/18/17-02/23/17   vaginal cuff 30 Gy in 5 fractions  . Hypothyroidism   . Neuromuscular disorder (HCC)    neuropathy  . Neuropathy    feet  . Postmenopausal bleeding 10/15/2016    Past Surgical History:  Procedure Laterality Date  . ABDOMINAL HYSTERECTOMY    .  ABDOMINOPLASTY    . APPENDECTOMY    . BUNIONECTOMY WITH CHILECTOMY Right 03/03/2016   Procedure: Lillard Anes WITH CHILECTOMY;  Surgeon: [name], MD;  Location: [facility];   Service: Orthopedics;  Laterality: Right;  . CHEILECTOMY Right 03/03/2016   Procedure: CHEILECTOMY RIGHT GREAT TOE;  Surgeon: [name], MD;  Location: [facility];  Service: Orthopedics;  Laterality: Right;  . COLONOSCOPY    . COSMETIC SURGERY    . DILATATION & CURETTAGE/HYSTEROSCOPY WITH MYOSURE N/A 10/15/2016   Procedure: DILATATION & CURETTAGE/HYSTEROSCOPY WITH MYOSURE;  Surgeon: [name], MD;  Location: [facility];  Service: Gynecology;  Laterality: N/A;  Requests Microdilators and Lacrimal Duct Dilators  . IR FLUORO GUIDE PORT INSERTION RIGHT  12/23/2016  . IR REMOVAL TUN ACCESS W/ PORT W/O FL MOD SED  05/20/2017  . IR US GUIDE VASC ACCESS RIGHT  12/23/2016  . OVARIAN CYST REMOVAL  1976  . ROBOTIC ASSISTED BILATERAL SALPINGO OOPHERECTOMY Bilateral 11/23/2016   Procedure: XI ROBOTIC ASSISTED LEFT OOPHORECTOMY;  Surgeon: [name], MD;  [facility];  Service: Gynecology;  Laterality: Bilateral;  . ROBOTIC ASSISTED TOTAL HYSTERECTOMY Bilateral 11/23/2016   Procedure: XI ROBOTIC ASSISTED TOTAL HYSTERECTOMY;  Surgeon: [name], MD;  [facility];  Service: Gynecology;  Laterality: Bilateral;  . SENTINEL NODE BIOPSY Bilateral 11/23/2016   Procedure: SENTINEL NODE BIOPSY;  Surgeon: [name], MD;  [facility];  Service: Gynecology;  Laterality: Bilateral;  . SHOULDER SURGERY Left 03/2015   by dr [name] at [facility]  04/1996    Current Outpatient Medications  Medication Sig Dispense Refill  . ibuprofen (ADVIL,MOTRIN) 200 MG tablet Take by mouth 2 (two) times daily as needed for moderate pain.     Marland Kitchen levothyroxine (SYNTHROID, LEVOTHROID) 137 MCG tablet TAKE ONE TABLET DAILY IN THE MORNING 1/2 HOUR PRIOR TO FOOD  5  . OVER THE COUNTER MEDICATION Take 5 tablets by mouth 3 (three) times a week. Pyridoxal 1, 2, 3, and 4 otc supplement  Vitamin package    . OVER THE COUNTER MEDICATION Take 1 tablet by mouth as directed. Sulfur 24 g7 otc supplement take 1 tablet  daily 5 days out of the week  Adrenaline gland support    . OVER THE COUNTER MEDICATION Take 1 capsule by mouth See admin instructions. Carbon 90 bx otc supplement take 1 capsule daily 5 days out of the week  Omega 3 supplement    . pseudoephedrine (SUDAFED) 30 MG tablet Take 30 mg by mouth as needed for congestion. Patient is taking this medication for allergies at this time.     No current facility-administered medications for this visit.     Social History   Socioeconomic History  . Marital status: Married    Spouse name: Joe  . Number of children: 0  . Years of education: 72  . Highest education level: Not on file  Occupational History  . Occupation: retired  Scientific laboratory technician  . Financial resource strain: Not on file  . Food insecurity:    Worry: Not on file    Inability: Not on file  . Transportation needs:    Medical: Not on file    Non-medical: Not on file  Tobacco Use  . Smoking status: Never Smoker  . Smokeless tobacco: Never Used  Substance and Sexual Activity  . Alcohol use: Yes    Comment: social, 2 glasses of wine per week  . Drug use: No  . Sexual activity: Not  on file  Lifestyle  . Physical activity:    Days per week: Not on file    Minutes per session: Not on file  . Stress: Not on file  Relationships  . Social connections:    Talks on phone: Not on file    Gets together: Not on file    Attends religious service: Not on file    Active member of club or organization: Not on file    Attends meetings of clubs or organizations: Not on file    Relationship status: Not on file  . Intimate partner violence:    Fear of current or ex partner: Not on file    Emotionally abused: Not on file    Physically abused: Not on file    Forced sexual activity: Not on file  Other Topics Concern  . 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  No children.   Highest level of education:  PhD in  leadership studies, MBA    Family History  Problem Relation Age of Onset  . Neurologic Disorder Mother        Trigeminal neuralgia  . Alzheimer's disease Mother   . Diabetes Father   . Breast cancer Sister     Thereasa Solo, MD 12/16/2017, 5:24 PM

## 2017-12-16 NOTE — Patient Instructions (Signed)
Please notify Dr Denman George at phone number [phone] if you notice vaginal bleeding, new pelvic or abdominal pains, bloating, feeling full easy, or a change in bladder or bowel function.    Please return to see Dr Sondra Come as scheduled in December and Dr Denman George in March, 2020.

## 2018-03-02 ENCOUNTER — Telehealth: Payer: Self-pay | Admitting: *Deleted

## 2018-03-02 NOTE — Telephone Encounter (Signed)
Called patient to let her know that I received her message about moving her fu from 03-06-18, appt. moved to 03-23-18 @ 8am , lvm for a return call

## 2018-03-06 ENCOUNTER — Ambulatory Visit: Payer: Medicare Other | Admitting: Radiation Oncology

## 2018-03-10 ENCOUNTER — Telehealth: Payer: Self-pay | Admitting: *Deleted

## 2018-03-10 NOTE — Telephone Encounter (Signed)
Called patient to inform that received her phone call, her appt. has been moved to 4 pm on 03-23-18 with Dr. Sondra Come, lvm for a return call

## 2018-03-23 ENCOUNTER — Encounter: Payer: Self-pay | Admitting: Radiation Oncology

## 2018-03-23 ENCOUNTER — Ambulatory Visit
Admission: RE | Admit: 2018-03-23 | Discharge: 2018-03-23 | Disposition: A | Discharge status: Home / Self Care | Payer: Medicare Other | Source: Ambulatory Visit | Attending: Radiation Oncology | Admitting: Radiation Oncology

## 2018-03-23 ENCOUNTER — Other Ambulatory Visit: Payer: Self-pay

## 2018-03-23 VITALS — BP 115/53 | HR 74 | Temp 98.3°F | Resp 18 | Ht 69.0 in | Wt 159.2 lb

## 2018-03-23 DIAGNOSIS — Z923 Personal history of irradiation: Secondary | ICD-10-CM | POA: Diagnosis not present

## 2018-03-23 DIAGNOSIS — C541 Malignant neoplasm of endometrium: Secondary | ICD-10-CM | POA: Insufficient documentation

## 2018-03-23 DIAGNOSIS — G629 Polyneuropathy, unspecified: Secondary | ICD-10-CM | POA: Insufficient documentation

## 2018-03-23 NOTE — Progress Notes (Signed)
Pt presents today for f/u appt with Dr. Sondra Come. Pt denies c/o pain with the exception of peripheral neuropathy. Pt denies dysuria/hematuria. Pt denies vaginal bleeding/discharge. Pt denies rectal bleeding, diarrhea/constipation. Pt denies abdominal bloating, N/V.  BP (!) 115/53 (BP Location: Left Arm)   Pulse 74   Temp 98.3 F (36.8 C) (Oral)   Resp 18   Ht 5\' 9"  (1.753 m)   Wt 159 lb 3.2 oz (72.2 kg)   SpO2 95%   BMI 23.51 kg/m   Wt Readings from Last 3 Encounters:  03/23/18 159 lb 3.2 oz (72.2 kg)  12/16/17 156 lb 9.6 oz (71 kg)  09/01/17 156 lb 9.6 oz (71 kg)   Loma Sousa, RN BSN

## 2018-03-23 NOTE — Progress Notes (Signed)
Radiation Oncology         [phone] ________________________________  Name: [name] MRN: [id number]  Date: 03/23/2018  DOB: [birth year]  Follow-Up Visit Note  CC: Deborah Gully, NP  Deborah Amber, MD    ICD-10-CM   1. Endometrial cancer ([place]) C54.1     Diagnosis:   68 y.o. female with Endometrial adenocarcinoma, FIGO grade III, stage pT1b,snpN1a  Interval Since Last Radiation:  1 year 1 month Radiation treatment dates:01/18/17, 01/27/17, 02/09/17, 02/16/17, 02/23/17 Site/dose:Vaginal cuff/ 30 Gy delivered in 5 fractions  Narrative:  The patient returns today for routine follow-up. She saw Dr. Denman Curry 3 months ago with good report. She denies any pain with the exception of peripheral neuropathy. Her neuropathy is being treated with platelet rich plasma. She remains very physically active. She denies dysuria or hematuria. She denies vaginal bleeding or discharge. She denies rectal bleeding, diarrhea, or constipation. She denies abdominal bloating, nausea, or vomiting.                  ALLERGIES:  has No Known Allergies.  Meds: Current Outpatient Medications  Medication Sig Dispense Refill  . ibuprofen (ADVIL,MOTRIN) 200 MG tablet Take by mouth 2 (two) times daily as needed for moderate pain.     Marland Kitchen levothyroxine (SYNTHROID, LEVOTHROID) 137 MCG tablet TAKE ONE TABLET DAILY IN THE MORNING 1/2 HOUR PRIOR TO FOOD  5  . OVER THE COUNTER MEDICATION Take 5 tablets by mouth 3 (three) times a week. Pyridoxal 1, 2, 3, and 4 otc supplement  Vitamin package    . OVER THE COUNTER MEDICATION Take 1 tablet by mouth as directed. Sulfur 24 g7 otc supplement take 1 tablet daily 5 days out of the week  Adrenaline gland support    . OVER THE COUNTER MEDICATION Take 1 capsule by mouth See admin instructions. Carbon 90 bx otc supplement take 1 capsule daily 5 days out of the week  Omega 3 supplement    . pseudoephedrine (SUDAFED) 30 MG tablet Take 30 mg by mouth as needed for congestion.  Patient is taking this medication for allergies at this time.     No current facility-administered medications for this encounter.     Physical Findings: The patient is in no acute distress. Patient is alert and oriented.  height is 5\' 9"  (1.753 m) and weight is 159 lb 3.2 oz (72.2 kg). Her oral temperature is 98.3 F (36.8 C). Her blood pressure is 115/53 (abnormal) and her pulse is 74. Her respiration is 18 and oxygen saturation is 95%.   Lungs are clear to auscultation bilaterally. Heart has regular rate and rhythm. No palpable cervical, supraclavicular, or axillary adenopathy. Abdomen soft, non-tender, normal bowel sounds.  On pelvic examination the external genitalia were unremarkable. A speculum exam was performed. There are no mucosal lesions noted in the vaginal vault. On bimanual and rectovaginal examination there were no pelvic masses appreciated. Vaginal cuff intact. Rectal sphincter tone good.  Lab Findings: Lab Results  Component Value Date   WBC 3.8 (L) 05/20/2017   HGB 11.6 (L) 05/20/2017   HCT 34.3 (L) 05/20/2017   MCV 102.7 (H) 05/20/2017   PLT 161 05/20/2017    Radiographic Findings: No results found.  Impression:  No evidence of recurrence on clinical exam. Doing well except for peripheral neuropathy.  Plan:  Patient will see Dr. Denman Curry in 3 months and follow up in radiation oncology in 6 months.  ____________________________________  Deborah Promise, PhD, MD  This document serves  as a record of services personally performed by Deborah Pray, MD. It was created on his behalf by [name], a trained medical scribe. The creation of this record is based on the scribe's personal observations and the provider's statements to them. This document has been checked and approved by the attending provider.

## 2018-03-24 ENCOUNTER — Telehealth: Payer: Self-pay | Admitting: *Deleted

## 2018-03-24 NOTE — Telephone Encounter (Signed)
Called patient to inform of fu with Dr. Denman George on 06-21-18 @ 1:30 pm and her fu with Dr. Sondra Come on 08-24-18 @ 4 pm, spoke with patient and she is aware of these appts.

## 2018-03-24 NOTE — Telephone Encounter (Signed)
Called patient to inform that fu with Dr. Denman George has been moved to 07-07-18 - arrival time- 1:15 pm, spoke with patient and she is aware of new date and time with Dr. Denman George

## 2018-06-16 ENCOUNTER — Telehealth: Payer: Self-pay | Admitting: *Deleted

## 2018-06-16 NOTE — Telephone Encounter (Signed)
Called and left the patient a message to call the office back. Need to move her 4/17 appt to June

## 2018-06-19 ENCOUNTER — Telehealth: Payer: Self-pay | Admitting: *Deleted

## 2018-06-19 NOTE — Telephone Encounter (Signed)
RECEIVED PHONE CALL FROM OGYN/ONC REGARDING 08/24/18 APPT. WITH DR. KINARD, THEY WANT THIS APPT. MOVED TO SEPT. 2020, SPOKE WITH PATIENT AND SHE AGREED TO COME IN ON 11-30-18 @ 4 PM FOR DR. KINARD FU

## 2018-06-19 NOTE — Telephone Encounter (Signed)
Called and spoke with the patient regarding her appt on 4/17. Appt moved from 4/17 to 6/10 due to COVID-19. Explained that I would call Dr. Clabe Seal office and move his appt out after the appt with Dr. Denman George.

## 2018-06-21 ENCOUNTER — Ambulatory Visit: Payer: Medicare Other | Admitting: Gynecologic Oncology

## 2018-07-07 ENCOUNTER — Ambulatory Visit: Payer: Medicare Other | Admitting: Gynecologic Oncology

## 2018-08-24 ENCOUNTER — Ambulatory Visit: Payer: Self-pay | Admitting: Radiation Oncology

## 2018-08-30 ENCOUNTER — Other Ambulatory Visit: Payer: Self-pay

## 2018-08-30 ENCOUNTER — Inpatient Hospital Stay: Payer: Medicare Other | Attending: Gynecologic Oncology | Admitting: Gynecologic Oncology

## 2018-08-30 ENCOUNTER — Encounter: Payer: Self-pay | Admitting: Gynecologic Oncology

## 2018-08-30 VITALS — BP 128/64 | HR 80 | Temp 98.7°F | Resp 18 | Ht 69.0 in | Wt 157.8 lb

## 2018-08-30 DIAGNOSIS — C541 Malignant neoplasm of endometrium: Secondary | ICD-10-CM

## 2018-08-30 DIAGNOSIS — Z8542 Personal history of malignant neoplasm of other parts of uterus: Secondary | ICD-10-CM | POA: Diagnosis not present

## 2018-08-30 DIAGNOSIS — Z923 Personal history of irradiation: Secondary | ICD-10-CM | POA: Diagnosis not present

## 2018-08-30 DIAGNOSIS — Z9221 Personal history of antineoplastic chemotherapy: Secondary | ICD-10-CM | POA: Diagnosis not present

## 2018-08-30 DIAGNOSIS — Z90710 Acquired absence of both cervix and uterus: Secondary | ICD-10-CM | POA: Diagnosis not present

## 2018-08-30 DIAGNOSIS — Z90722 Acquired absence of ovaries, bilateral: Secondary | ICD-10-CM | POA: Diagnosis not present

## 2018-08-30 DIAGNOSIS — Z08 Encounter for follow-up examination after completed treatment for malignant neoplasm: Secondary | ICD-10-CM | POA: Insufficient documentation

## 2018-08-30 NOTE — Patient Instructions (Signed)
Please notify Dr Denman George at phone number [phone] if you notice vaginal bleeding, new pelvic or abdominal pains, bloating, feeling full easy, or a change in bladder or bowel function.   After you see Dr Sondra Come in September, please have his office contact Dr Serita Grit to schedule a December appointment.  Dr Serita Grit email is [email or web address]

## 2018-08-30 NOTE — Progress Notes (Signed)
Follow-up Note: Gyn-Onc   [name] 68 y.o. female  Chief Complaint  Patient presents with  . Endometrial cancer, FIGO stage IIIC (HCC)    Assessment :Endometrioid adenocarcinoma the endometrium (grade 3), stage IIIC 1. MSI stable.  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  No evidence of recurrence.   Plan:  S/p systemic therapy with 6 cycles of carboplatin and paclitaxel with vaginal brachytherapy to assist with local control with therapy completed in January, 2019.  Continue 3 monthly surveillance examinations until January, 2021.  We discussed survivorship issues including:  Adrenal function Exercise Diet Non-gyn cancer screening.   HPI: 68 year old white married female seen in consultation at the request of [name] regarding management of a newly diagnosed endometrial carcinoma. The patient had some postmenopausal bleeding in December 2017 which by February had abated. Then returned more recently with heavier bleeding. Patient was found to have cervical stenosis and therefore underwent a formal D&C 10/15/2016. She was found to have a large endometrial polyp the grade 2 endometrial carcinoma. The patient's had an uncomplicated postoperative course. Patient has some past gynecologic history including a left oophorectomy in her teens for an ovarian cyst. 2006 she had bilateral salpingectomy for what I presume is hydrosalpinx. The patient is not on any hormone replacement therapy.  On 11/23/16 she underwent robotic assisted total hysterectomy, BSO, SLN biopsy. Final pathology revealed a 6cm grade 3 endometrioid tumor with 1.3cm of 1.9cm myo invasion, no LVSI. Negative adnexae, but 1of 2 SLN's was positive for macrometastatic disease (from the left external iliac node).  In accordance with NCCN guidelines she was counseled to receive adjuvant chemotherapy and vaginal brachytherapy which she received between October. 2018 and January, 2019. She developed severe neuropathy of the feet with Taxol and  has been using natural therapies and electrical stimulation.  Of interest, her neuropathy first developed in 2010 after treatment with antibiotics for a pelvic infection. After chemotherapy in 2018 it became worse.   Interval Hx: She has been taking natural supplements suppled by [name].   She has no symptoms concerning for recurrence.   She has worsening lower extremity distal neuropathy (in soles of feet).  Review of Systems:10 point review of systems is negative except as noted in interval history.   Vitals: Blood pressure 128/64, pulse 80, temperature 98.7 F (37.1 C), temperature source Oral, resp. rate 18, height '5\' 9"'  (1.753 m), weight 157 lb 12.8 oz (71.6 kg), SpO2 97 %.  Physical Exam: General : The patient is a healthy woman in no acute distress.  HEENT: normocephalic, extraoccular movements normal; neck is supple without thyromegally  Lynphnodes: Supraclavicular and inguinal nodes not enlarged  Abdomen: Soft, non-tender, no ascites, no organomegally, no masses, no hernias, incisions well healed Pelvic:  EGBUS: Normal female  Vagina: normal vaginal cuff. No bleeding or lesions. No palpable masses.  Bi-manual examination: Non-tender; no adenxal masses or nodularity  Rectal: normal sphincter tone, no masses, no blood  Lower extremities: No edema or varicosities. Normal range of motion      No Known Allergies  Past Medical History:  Diagnosis Date  . Cancer ([place]) 1998   thyroid ca  . Degenerative arthritis of toe joint, right    great toe  . Endometrial cancer ([place])   . Endometrial mass 10/15/2016  . History of radiation therapy 01/18/17-02/23/17   vaginal cuff 30 Gy in 5 fractions  . Hypothyroidism   . Neuromuscular disorder (HCC)    neuropathy  . Neuropathy    feet  .  Postmenopausal bleeding 10/15/2016    Past Surgical History:  Procedure Laterality Date  . ABDOMINAL HYSTERECTOMY    . ABDOMINOPLASTY    . APPENDECTOMY    . BUNIONECTOMY WITH  CHILECTOMY Right 03/03/2016   Procedure: Lillard Anes WITH CHILECTOMY;  Surgeon: [name], MD;  Location: [facility];  Service: Orthopedics;  Laterality: Right;  . CHEILECTOMY Right 03/03/2016   Procedure: CHEILECTOMY RIGHT GREAT TOE;  Surgeon: [name], MD;  Location: [facility];  Service: Orthopedics;  Laterality: Right;  . COLONOSCOPY    . COSMETIC SURGERY    . DILATATION & CURETTAGE/HYSTEROSCOPY WITH MYOSURE N/A 10/15/2016   Procedure: DILATATION & CURETTAGE/HYSTEROSCOPY WITH MYOSURE;  Surgeon: [name], MD;  Location: [facility];  Service: Gynecology;  Laterality: N/A;  Requests Microdilators and Lacrimal Duct Dilators  . IR FLUORO GUIDE PORT INSERTION RIGHT  12/23/2016  . IR REMOVAL TUN ACCESS W/ PORT W/O FL MOD SED  05/20/2017  . IR US GUIDE VASC ACCESS RIGHT  12/23/2016  . OVARIAN CYST REMOVAL  1976  . ROBOTIC ASSISTED BILATERAL SALPINGO OOPHERECTOMY Bilateral 11/23/2016   Procedure: XI ROBOTIC ASSISTED LEFT OOPHORECTOMY;  Surgeon: [name], MD;  Location: [facility];  Service: Gynecology;  Laterality: Bilateral;  . ROBOTIC ASSISTED TOTAL HYSTERECTOMY Bilateral 11/23/2016   Procedure: XI ROBOTIC ASSISTED TOTAL HYSTERECTOMY;  Surgeon: [name], MD;  Location: [facility];  Service: Gynecology;  Laterality: Bilateral;  . SENTINEL NODE BIOPSY Bilateral 11/23/2016   Procedure: SENTINEL NODE BIOPSY;  Surgeon: [name], MD;  Location: [facility];  Service: Gynecology;  Laterality: Bilateral;  . SHOULDER SURGERY Left 03/2015   by dr [name] at [place]  04/1996    Current Outpatient Medications  Medication Sig Dispense Refill  . ibuprofen (ADVIL,MOTRIN) 200 MG tablet Take by mouth 2 (two) times daily as needed for moderate pain.     Marland Kitchen levothyroxine (SYNTHROID) 150 MCG tablet     . OVER THE COUNTER MEDICATION Take 5 tablets by mouth 3 (three) times a week. Pyridoxal 1, 2, 3, and 4 otc supplement  Vitamin package    . OVER THE COUNTER MEDICATION Take 1  tablet by mouth as directed. Sulfur 24 g7 otc supplement take 1 tablet daily 5 days out of the week  Adrenaline gland support    . OVER THE COUNTER MEDICATION Take 1 capsule by mouth See admin instructions. Carbon 90 bx otc supplement take 1 capsule daily 5 days out of the week  Omega 3 supplement    . pseudoephedrine (SUDAFED) 30 MG tablet Take 30 mg by mouth as needed for congestion. Patient is taking this medication for allergies at this time.     No current facility-administered medications for this visit.     Social History   Socioeconomic History  . Marital status: Married    Spouse name: Joe  . Number of children: 0  . Years of education: 4  . Highest education level: Not on file  Occupational History  . Occupation: retired  Scientific laboratory technician  . Financial resource strain: Not on file  . Food insecurity:    Worry: Not on file    Inability: Not on file  . Transportation needs:    Medical: Not on file    Non-medical: Not on file  Tobacco Use  . Smoking status: Never Smoker  . Smokeless tobacco: Never Used  Substance and Sexual Activity  . Alcohol use: Yes    Comment: social, 2 glasses of wine per week  .  Drug use: No  . Sexual activity: Not on file  Lifestyle  . Physical activity:    Days per week: Not on file    Minutes per session: Not on file  . Stress: Not on file  Relationships  . Social connections:    Talks on phone: Not on file    Gets together: Not on file    Attends religious service: Not on file    Active member of club or organization: Not on file    Attends meetings of clubs or organizations: Not on file    Relationship status: Not on file  . Intimate partner violence:    Fear of current or ex partner: Not on file    Emotionally abused: Not on file    Physically abused: Not on file    Forced sexual activity: Not on file  Other Topics Concern  . Not on file  Social History Narrative   She retired from Building control surveyor in 2015.   She  lives with husband.  No children.   Highest level of education:  PhD in leadership studies, MBA    Family History  Problem Relation Age of Onset  . Neurologic Disorder Mother        Trigeminal neuralgia  . Alzheimer's disease Mother   . Diabetes Father   . Breast cancer Sister     Thereasa Solo, MD 08/30/2018, 4:07 PM

## 2018-11-30 ENCOUNTER — Encounter: Payer: Self-pay | Admitting: Radiation Oncology

## 2018-11-30 ENCOUNTER — Ambulatory Visit
Admission: RE | Admit: 2018-11-30 | Discharge: 2018-11-30 | Disposition: A | Discharge status: Home / Self Care | Payer: Medicare Other | Source: Ambulatory Visit | Attending: Radiation Oncology | Admitting: Radiation Oncology

## 2018-11-30 ENCOUNTER — Other Ambulatory Visit: Payer: Self-pay

## 2018-11-30 VITALS — BP 121/54 | HR 60 | Temp 97.8°F | Resp 18 | Ht 69.0 in | Wt 155.5 lb

## 2018-11-30 DIAGNOSIS — C541 Malignant neoplasm of endometrium: Secondary | ICD-10-CM

## 2018-11-30 DIAGNOSIS — Z79899 Other long term (current) drug therapy: Secondary | ICD-10-CM | POA: Insufficient documentation

## 2018-11-30 DIAGNOSIS — Z923 Personal history of irradiation: Secondary | ICD-10-CM | POA: Diagnosis not present

## 2018-11-30 DIAGNOSIS — G629 Polyneuropathy, unspecified: Secondary | ICD-10-CM | POA: Diagnosis not present

## 2018-11-30 DIAGNOSIS — Z8542 Personal history of malignant neoplasm of other parts of uterus: Secondary | ICD-10-CM | POA: Diagnosis not present

## 2018-11-30 NOTE — Progress Notes (Signed)
Pt presents today for f/u with Dr. Sondra Come. Pt denies c/o pain. Pt denies dysuria/hematuria. Pt denies vaginal bleeding/discharge. Pt denies rectal bleeding, diarrhea/constipation. Pt denies abdominal bloating, N/V.   BP (!) 121/54 (BP Location: Left Arm, Patient Position: Sitting)   Pulse 60   Temp 97.8 F (36.6 C) (Temporal)   Resp 18   Ht 5\' 9"  (1.753 m)   Wt 155 lb 8 oz (70.5 kg)   SpO2 100%   BMI 22.96 kg/m   Wt Readings from Last 3 Encounters:  11/30/18 155 lb 8 oz (70.5 kg)  08/30/18 157 lb 12.8 oz (71.6 kg)  03/23/18 159 lb 3.2 oz (72.2 kg)   Loma Sousa, RN BSN

## 2018-11-30 NOTE — Patient Instructions (Signed)
Coronavirus (COVID-19) Are you at risk?  Are you at risk for the Coronavirus (COVID-19)?  To be considered HIGH RISK for Coronavirus (COVID-19), you have to meet the following criteria:  . Traveled to [place], [place], [place], [place] or [place]; or in the [place] to [place], [place], [place], or [place]; and have fever, cough, and shortness of breath within the last 2 weeks of travel OR . Been in close contact with a person diagnosed with COVID-19 within the last 2 weeks and have fever, cough, and shortness of breath . IF YOU DO NOT MEET THESE CRITERIA, YOU ARE CONSIDERED LOW RISK FOR COVID-19.  What to do if you are HIGH RISK for COVID-19?  . If you are having a medical emergency, call 911. . Seek medical care right away. Before you go to a doctor's office, urgent care or emergency department, call ahead and tell them about your recent travel, contact with someone diagnosed with COVID-19, and your symptoms. You should receive instructions from your physician's office regarding next steps of care.  . When you arrive at healthcare provider, tell the healthcare staff immediately you have returned from visiting [place], [place], [place], [place] or [place]; or traveled in the [place] to [place], [place], [place], or [place]; in the last two weeks or you have been in close contact with a person diagnosed with COVID-19 in the last 2 weeks.   . Tell the health care staff about your symptoms: fever, cough and shortness of breath. . After you have been seen by a medical provider, you will be either: o Tested for (COVID-19) and discharged home on quarantine except to seek medical care if symptoms worsen, and asked to  - Stay home and avoid contact with others until you get your results (4-5 days)  - Avoid travel on public transportation if possible (such as bus, train, or airplane) or o Sent to the Emergency Department by EMS for evaluation, COVID-19 testing, and possible  admission depending on your condition and test results.  What to do if you are LOW RISK for COVID-19?  Reduce your risk of any infection by using the same precautions used for avoiding the common cold or flu:  . Wash your hands often with soap and warm water for at least 20 seconds.  If soap and water are not readily available, use an alcohol-based hand sanitizer with at least 60% alcohol.  . If coughing or sneezing, cover your mouth and nose by coughing or sneezing into the elbow areas of your shirt or coat, into a tissue or into your sleeve (not your hands). . Avoid shaking hands with others and consider head nods or verbal greetings only. . Avoid touching your eyes, nose, or mouth with unwashed hands.  . Avoid close contact with people who are sick. . Avoid places or events with large numbers of people in one location, like concerts or sporting events. . Carefully consider travel plans you have or are making. . If you are planning any travel outside or inside the [place], visit the CDC's Travelers' Health webpage for the latest health notices. . If you have some symptoms but not all symptoms, continue to monitor at home and seek medical attention if your symptoms worsen. . If you are having a medical emergency, call 911.   ADDITIONAL HEALTHCARE OPTIONS FOR PATIENTS  [facility] / e-Visit: [email or web address]         [facility]: [phone]  [facility]: [phone]                   [facility]: [phone]   

## 2018-11-30 NOTE — Progress Notes (Signed)
Radiation Oncology         [phone] ________________________________  Name: [name] MRN: [id number]  Date: 11/30/2018  DOB: 04-12-1950  Follow-Up Visit Note  CC: [name], NP  [name], MD    ICD-10-CM   1. Endometrial cancer ([place])  C54.1     Diagnosis:   68 y.o. female with Endometrial adenocarcinoma, FIGO grade III, stage pT1b,snpN1a  Interval Since Last Radiation:  1 year, 9 months  Radiation treatment dates:01/18/17, 01/27/17, 02/09/17, 02/16/17, 02/23/17  Site/dose:Vaginal cuff/ 30 Gy delivered in 5 fractions  Narrative:  The patient returns today for routine follow-up. She saw Dr. Denman George 3 months ago with good report.             On review of systems, she is doing well overall. She denies pain, dysuria or hematuria, vaginal discharge or bleeding, rectal bleeding, diarrhea or constipation, abdominal bloating, and nausea or vomiting.  She continues to use her vaginal dilator as recommended.  ALLERGIES:  has No Known Allergies.  Meds: Current Outpatient Medications  Medication Sig Dispense Refill  . ibuprofen (ADVIL,MOTRIN) 200 MG tablet Take by mouth 2 (two) times daily as needed for moderate pain.     Marland Kitchen levothyroxine (SYNTHROID) 150 MCG tablet     . OVER THE COUNTER MEDICATION Take 5 tablets by mouth 3 (three) times a week. Pyridoxal 1, 2, 3, and 4 otc supplement  Vitamin package    . OVER THE COUNTER MEDICATION Take 1 tablet by mouth as directed. Sulfur 24 g7 otc supplement take 1 tablet daily 5 days out of the week  Adrenaline gland support    . OVER THE COUNTER MEDICATION Take 1 capsule by mouth See admin instructions. Carbon 90 bx otc supplement take 1 capsule daily 5 days out of the week  Omega 3 supplement    . pseudoephedrine (SUDAFED) 30 MG tablet Take 30 mg by mouth as needed for congestion. Patient is taking this medication for allergies at this time.     No current facility-administered medications for this encounter.     Physical  Findings: The patient is in no acute distress. Patient is alert and oriented.  height is 5\' 9"  (1.753 m) and weight is 155 lb 8 oz (70.5 kg). Her temporal temperature is 97.8 F (36.6 C). Her blood pressure is 121/54 (abnormal) and her pulse is 60. Her respiration is 18 and oxygen saturation is 100%.   Lungs are clear to auscultation bilaterally. Heart has regular rate and rhythm. No palpable cervical, supraclavicular, or axillary adenopathy. Abdomen soft, non-tender, normal bowel sounds. On pelvic examination the external genitalia were unremarkable. A speculum exam was performed. There are no mucosal lesions noted in the vaginal vault. On bimanual and rectovaginal examination there were no pelvic masses appreciated. Vaginal cuff intact. Rectal sphincter tone good.  Lab Findings: Lab Results  Component Value Date   WBC 3.8 (L) 05/20/2017   HGB 11.6 (L) 05/20/2017   HCT 34.3 (L) 05/20/2017   MCV 102.7 (H) 05/20/2017   PLT 161 05/20/2017    Radiographic Findings: No results found.  Impression:  No evidence of recurrence on clinical exam. Doing well except for peripheral neuropathy which she manages very well.  She is able to walk for exercise and play golf with this issue.  Plan:  Patient will see Dr. Denman George in 3 months and follow up in radiation oncology in 6 months, she will then be able to transition  to every 8-month follow-ups.  ____________________________________  Jeneen Rinks  Jabier Gauss, PhD, MD  This document serves as a record of services personally performed by Gery Pray, MD. It was created on his behalf by Wilburn Mylar, a trained medical scribe. The creation of this record is based on the scribe's personal observations and the provider's statements to them. This document has been checked and approved by the attending provider.

## 2019-03-08 ENCOUNTER — Other Ambulatory Visit: Payer: Self-pay

## 2019-03-08 ENCOUNTER — Encounter: Payer: Self-pay | Admitting: Gynecologic Oncology

## 2019-03-08 ENCOUNTER — Inpatient Hospital Stay: Payer: Medicare Other | Attending: Gynecologic Oncology | Admitting: Gynecologic Oncology

## 2019-03-08 VITALS — BP 120/68 | HR 73 | Temp 98.2°F | Resp 18 | Ht 66.0 in | Wt 155.5 lb

## 2019-03-08 DIAGNOSIS — E039 Hypothyroidism, unspecified: Secondary | ICD-10-CM | POA: Diagnosis not present

## 2019-03-08 DIAGNOSIS — C541 Malignant neoplasm of endometrium: Secondary | ICD-10-CM | POA: Diagnosis not present

## 2019-03-08 DIAGNOSIS — Z79899 Other long term (current) drug therapy: Secondary | ICD-10-CM | POA: Diagnosis not present

## 2019-03-08 DIAGNOSIS — Z923 Personal history of irradiation: Secondary | ICD-10-CM | POA: Diagnosis not present

## 2019-03-08 DIAGNOSIS — Z90722 Acquired absence of ovaries, bilateral: Secondary | ICD-10-CM | POA: Insufficient documentation

## 2019-03-08 DIAGNOSIS — Z9221 Personal history of antineoplastic chemotherapy: Secondary | ICD-10-CM | POA: Diagnosis not present

## 2019-03-08 DIAGNOSIS — Z90710 Acquired absence of both cervix and uterus: Secondary | ICD-10-CM | POA: Diagnosis not present

## 2019-03-08 DIAGNOSIS — G629 Polyneuropathy, unspecified: Secondary | ICD-10-CM | POA: Diagnosis not present

## 2019-03-08 DIAGNOSIS — Z85850 Personal history of malignant neoplasm of thyroid: Secondary | ICD-10-CM | POA: Insufficient documentation

## 2019-03-08 DIAGNOSIS — Z9079 Acquired absence of other genital organ(s): Secondary | ICD-10-CM | POA: Insufficient documentation

## 2019-03-08 NOTE — Patient Instructions (Signed)
Please notify Dr Denman George at phone number [phone] if you notice vaginal bleeding, new pelvic or abdominal pains, bloating, feeling full easy, or a change in bladder or bowel function.   Please return to see Dr Denman George as scheduled in June, 2021.

## 2019-03-08 NOTE — Progress Notes (Signed)
Follow-up Note: Gyn-Onc   [name] 68 y.o. female  Chief Complaint  Patient presents with  . Endometrial cancer, FIGO stage IIIC (HCC)    Assessment :Endometrioid adenocarcinoma the endometrium (grade 3), stage IIIC 1. MSI stable.  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 No evidence of recurrence.   Plan:  S/p systemic therapy with 6 cycles of carboplatin and paclitaxel with vaginal brachytherapy to assist with local control with therapy completed in January, 2019.  We will transition to 6 monthly evaluations with an exam every 6 months. I will see her back in June, 2021.  HPI: 68 year old white married female seen in consultation at the request of [name] regarding management of a newly diagnosed endometrial carcinoma. The patient had some postmenopausal bleeding in December 2017 which by February had abated. Then returned more recently with heavier bleeding. Patient was found to have cervical stenosis and therefore underwent a formal D&C 10/15/2016. She was found to have a large endometrial polyp the grade 2 endometrial carcinoma. The patient's had an uncomplicated postoperative course. Patient has some past gynecologic history including a left oophorectomy in her teens for an ovarian cyst. 2006 she had bilateral salpingectomy for what I presume is hydrosalpinx. The patient is not on any hormone replacement therapy.  On 11/23/16 she underwent robotic assisted total hysterectomy, BSO, SLN biopsy. Final pathology revealed a 6cm grade 3 endometrioid tumor with 1.3cm of 1.9cm myo invasion, no LVSI. Negative adnexae, but 1of 2 SLN's was positive for macrometastatic disease (from the left external iliac node).  In accordance with NCCN guidelines she was counseled to receive adjuvant chemotherapy and vaginal brachytherapy which she received between October. 2018 and January, 2019. 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  Of  interest, her neuropathy first developed in 2010 after treatment with antibiotics for a pelvic infection. After chemotherapy in 2018 it became worse.   Interval Hx: She has been taking natural supplements suppled by [name].   She has no symptoms concerning for recurrence.   She has stable lower extremity distal neuropathy (in soles of feet).  Review of Systems:10 point review of systems is negative except as noted in interval history.   Vitals: Blood pressure 120/68, pulse 73, temperature 98.2 F (36.8 C), temperature source Temporal, resp. rate 18, height '5\' 6"'  (1.676 m), weight 155 lb 8 oz (70.5 kg), SpO2 100 %.  Physical Exam: General : The patient is a healthy woman in no acute distress.  HEENT: normocephalic, extraoccular movements normal; neck is supple without thyromegally  Lynphnodes: Supraclavicular and inguinal nodes not enlarged  Abdomen: Soft, non-tender, no ascites, no organomegally, no masses, no hernias, incisions well healed Pelvic:  EGBUS: Normal female  Vagina: normal vaginal cuff. No bleeding or lesions. No palpable masses.  Bi-manual examination: Non-tender; no adenxal masses or nodularity  Rectal: normal sphincter tone, no masses, no blood  Lower extremities: No edema or varicosities. Normal range of motion      No Known Allergies  Past Medical History:  Diagnosis Date  . Cancer ([place]) 1998   thyroid ca  . Degenerative arthritis of toe joint, right    great toe  . Endometrial cancer ([place])   . Endometrial mass 10/15/2016  . History of radiation therapy 01/18/17-02/23/17   vaginal cuff 30 Gy in 5 fractions  . Hypothyroidism   . Neuromuscular disorder (HCC)    neuropathy  . Neuropathy    feet  . Postmenopausal bleeding  10/15/2016    Past Surgical History:  Procedure Laterality Date  . ABDOMINAL HYSTERECTOMY    . ABDOMINOPLASTY    . APPENDECTOMY    . BUNIONECTOMY WITH CHILECTOMY Right 03/03/2016   Procedure: Lillard Anes WITH  CHILECTOMY;  Surgeon: [name], MD;  Location: [facility];  Service: Orthopedics;  Laterality: Right;  . CHEILECTOMY Right 03/03/2016   Procedure: CHEILECTOMY RIGHT GREAT TOE;  Surgeon: [name], MD;  Location: [facility];  Service: Orthopedics;  Laterality: Right;  . COLONOSCOPY    . COSMETIC SURGERY    . DILATATION & CURETTAGE/HYSTEROSCOPY WITH MYOSURE N/A 10/15/2016   Procedure: DILATATION & CURETTAGE/HYSTEROSCOPY WITH MYOSURE;  Surgeon: [name], MD;  Location: [facility];  Service: Gynecology;  Laterality: N/A;  Requests Microdilators and Lacrimal Duct Dilators  . IR FLUORO GUIDE PORT INSERTION RIGHT  12/23/2016  . IR REMOVAL TUN ACCESS W/ PORT W/O FL MOD SED  05/20/2017  . IR US GUIDE VASC ACCESS RIGHT  12/23/2016  . OVARIAN CYST REMOVAL  1976  . ROBOTIC ASSISTED BILATERAL SALPINGO OOPHERECTOMY Bilateral 11/23/2016   Procedure: XI ROBOTIC ASSISTED LEFT OOPHORECTOMY;  Surgeon: [name], MD;  [facility];  Service: Gynecology;  Laterality: Bilateral;  . ROBOTIC ASSISTED TOTAL HYSTERECTOMY Bilateral 11/23/2016   Procedure: XI ROBOTIC ASSISTED TOTAL HYSTERECTOMY;  Surgeon: [name], MD;  [facility];  Service: Gynecology;  Laterality: Bilateral;  . SENTINEL NODE BIOPSY Bilateral 11/23/2016   Procedure: SENTINEL NODE BIOPSY;  Surgeon: [name], MD;  [facility];  Service: Gynecology;  Laterality: Bilateral;  . SHOULDER SURGERY Left 03/2015   by dr [name] at [place]  04/1996    Current Outpatient Medications  Medication Sig Dispense Refill  . ibuprofen (ADVIL,MOTRIN) 200 MG tablet Take by mouth 2 (two) times daily as needed for moderate pain.     Marland Kitchen levothyroxine (SYNTHROID) 150 MCG tablet     . OVER THE COUNTER MEDICATION Take 5 tablets by mouth 3 (three) times a week. Pyridoxal 1, 2, 3, and 4 otc supplement  Vitamin package    . OVER THE COUNTER MEDICATION Take 1 tablet by mouth as directed. Sulfur 24 g7 otc supplement take 1  tablet daily 5 days out of the week  Adrenaline gland support    . OVER THE COUNTER MEDICATION Take 1 capsule by mouth See admin instructions. Carbon 90 bx otc supplement take 1 capsule daily 5 days out of the week  Omega 3 supplement    . pseudoephedrine (SUDAFED) 30 MG tablet Take 30 mg by mouth as needed for congestion. Patient is taking this medication for allergies at this time.     No current facility-administered medications for this visit.    Social History   Socioeconomic History  . Marital status: Married    Spouse name: Joe  . Number of children: 0  . Years of education: 66  . Highest education level: Not on file  Occupational History  . Occupation: retired  Tobacco Use  . Smoking status: Never Smoker  . Smokeless tobacco: Never Used  Substance and Sexual Activity  . Alcohol use: Yes    Comment: social, 2 glasses of wine per week  . Drug use: No  . Sexual activity: Not on file  Other Topics Concern  . 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  No children.   Highest level of education:  PhD in leadership studies, MBA   Social Determinants of Health   Financial Resource Strain:   . Difficulty of Paying Living Expenses: Not on file  Food Insecurity:   . Worried About Charity fundraiser in the Last Year: Not on file  . Ran Out of Food in the Last Year: Not on file  Transportation Needs:   . Lack of Transportation (Medical): Not on file  . Lack of Transportation (Non-Medical): Not on file  Physical Activity:   . Days of Exercise per Week: Not on file  . Minutes of Exercise per Session: Not on file  Stress:   . Feeling of Stress : Not on file  Social Connections:   . Frequency of Communication with Friends and Family: Not on file  . Frequency of Social Gatherings with Friends and Family: Not on file  . Attends Religious Services: Not on file  . Active Member of Clubs or  Organizations: Not on file  . Attends Archivist Meetings: Not on file  . Marital Status: Not on file  Intimate Partner Violence:   . Fear of Current or Ex-Partner: Not on file  . Emotionally Abused: Not on file  . Physically Abused: Not on file  . Sexually Abused: Not on file    Family History  Problem Relation Age of Onset  . Neurologic Disorder Mother        Trigeminal neuralgia  . Alzheimer's disease Mother   . Diabetes Father   . Breast cancer Sister     Thereasa Solo, MD 03/08/2019, 5:40 PM

## 2019-05-21 ENCOUNTER — Ambulatory Visit: Payer: Medicare Other | Admitting: Radiation Oncology

## 2019-09-04 ENCOUNTER — Inpatient Hospital Stay: Payer: Medicare PPO | Attending: Gynecologic Oncology | Admitting: Gynecologic Oncology

## 2019-09-04 ENCOUNTER — Encounter: Payer: Self-pay | Admitting: Gynecologic Oncology

## 2019-09-04 ENCOUNTER — Other Ambulatory Visit: Payer: Self-pay

## 2019-09-04 VITALS — BP 124/57 | HR 66 | Temp 98.6°F | Resp 18 | Ht 66.0 in | Wt 151.2 lb

## 2019-09-04 DIAGNOSIS — Z9221 Personal history of antineoplastic chemotherapy: Secondary | ICD-10-CM | POA: Diagnosis not present

## 2019-09-04 DIAGNOSIS — Z923 Personal history of irradiation: Secondary | ICD-10-CM | POA: Insufficient documentation

## 2019-09-04 DIAGNOSIS — Z90710 Acquired absence of both cervix and uterus: Secondary | ICD-10-CM | POA: Diagnosis not present

## 2019-09-04 DIAGNOSIS — Z90722 Acquired absence of ovaries, bilateral: Secondary | ICD-10-CM | POA: Insufficient documentation

## 2019-09-04 DIAGNOSIS — C541 Malignant neoplasm of endometrium: Secondary | ICD-10-CM | POA: Diagnosis not present

## 2019-09-04 NOTE — Progress Notes (Signed)
Follow-up Note: Gyn-Onc   [name] 69 y.o. female  Chief Complaint  Patient presents with  . endometrial cancer    Assessment :Endometrioid adenocarcinoma the endometrium (grade 3), stage IIIC 1. MSI stable.  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 No evidence of recurrence.   Plan:  S/p systemic therapy with 6 cycles of carboplatin and paclitaxel with vaginal brachytherapy to assist with local control with therapy completed in January, 2019.  We continue 6 monthly evaluations with an exam every 6 months. I will see her back in December, 2021.  HPI: 69 year old white married female seen in consultation at the request of [name] regarding management of a newly diagnosed endometrial carcinoma. The patient had some postmenopausal bleeding in December 2017 which by February had abated. Then returned more recently with heavier bleeding. Patient was found to have cervical stenosis and therefore underwent a formal D&C 10/15/2016. She was found to have a large endometrial polyp the grade 2 endometrial carcinoma. The patient's had an uncomplicated postoperative course. Patient has some past gynecologic history including a left oophorectomy in her teens for an ovarian cyst. 2006 she had bilateral salpingectomy for what I presume is hydrosalpinx. The patient is not on any hormone replacement therapy.  On 11/23/16 she underwent robotic assisted total hysterectomy, BSO, SLN biopsy. Final pathology revealed a 6cm grade 3 endometrioid tumor with 1.3cm of 1.9cm myo invasion, no LVSI. Negative adnexae, but 1of 2 SLN's was positive for macrometastatic disease (from the left external iliac node).  In accordance with NCCN guidelines she was counseled to receive adjuvant chemotherapy and vaginal brachytherapy which she received between October. 2018 and January, 2019. 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  Of interest, her neuropathy first  developed in 2010 after treatment with antibiotics for a pelvic infection. After chemotherapy in 2018 it became worse.   Interval Hx: She has been taking natural supplements suppled by [name].   She has no symptoms concerning for recurrence.   She has stable lower extremity distal neuropathy (in soles of feet). She has begun taking OTC Neurvive for this which has been associated with some decreased sugar cravings and weight loss.   Review of Systems:10 point review of systems is negative except as noted in interval history.   Vitals: Blood pressure (!) 124/57, pulse 66, temperature 98.6 F (37 C), temperature source Oral, resp. rate 18, height '5\' 6"'  (1.676 m), weight 151 lb 3.2 oz (68.6 kg), SpO2 97 %.  Physical Exam: General : The patient is a healthy woman in no acute distress.  HEENT: normocephalic, extraoccular movements normal; neck is supple without thyromegally  Lynphnodes: Supraclavicular and inguinal nodes not enlarged  Abdomen: Soft, non-tender, no ascites, no organomegally, no masses, no hernias, incisions well healed Pelvic:  EGBUS: Normal female  Vagina: normal vaginal cuff. No bleeding or lesions. No palpable masses.  Bi-manual examination: Non-tender; no adenxal masses or nodularity  Rectal: normal sphincter tone, no masses, no blood  Lower extremities: No edema or varicosities. Normal range of motion      No Known Allergies  Past Medical History:  Diagnosis Date  . Cancer ([place]) 1998   thyroid ca  . Degenerative arthritis of toe joint, right    great toe  . Endometrial cancer ([place])   . Endometrial mass 10/15/2016  . History of radiation therapy 01/18/17-02/23/17   vaginal cuff 30 Gy in 5 fractions  . Hypothyroidism   . Neuromuscular disorder (  HCC)    neuropathy  . Neuropathy    feet  . Postmenopausal bleeding 10/15/2016    Past Surgical History:  Procedure Laterality Date  . ABDOMINAL HYSTERECTOMY    . ABDOMINOPLASTY    . APPENDECTOMY     . BUNIONECTOMY WITH CHILECTOMY Right 03/03/2016   Procedure: Lillard Anes WITH CHILECTOMY;  Surgeon: [name], MD;  Location: [facility];  Service: Orthopedics;  Laterality: Right;  . CHEILECTOMY Right 03/03/2016   Procedure: CHEILECTOMY RIGHT GREAT TOE;  Surgeon: [name], MD;  Location: [facility];  Service: Orthopedics;  Laterality: Right;  . COLONOSCOPY    . COSMETIC SURGERY    . DILATATION & CURETTAGE/HYSTEROSCOPY WITH MYOSURE N/A 10/15/2016   Procedure: DILATATION & CURETTAGE/HYSTEROSCOPY WITH MYOSURE;  Surgeon: [name], MD;  Location: [facility];  Service: Gynecology;  Laterality: N/A;  Requests Microdilators and Lacrimal Duct Dilators  . IR FLUORO GUIDE PORT INSERTION RIGHT  12/23/2016  . IR REMOVAL TUN ACCESS W/ PORT W/O FL MOD SED  05/20/2017  . IR US GUIDE VASC ACCESS RIGHT  12/23/2016  . OVARIAN CYST REMOVAL  1976  . ROBOTIC ASSISTED BILATERAL SALPINGO OOPHERECTOMY Bilateral 11/23/2016   Procedure: XI ROBOTIC ASSISTED LEFT OOPHORECTOMY;  Surgeon: [name], MD;  [facility];  Service: Gynecology;  Laterality: Bilateral;  . ROBOTIC ASSISTED TOTAL HYSTERECTOMY Bilateral 11/23/2016   Procedure: XI ROBOTIC ASSISTED TOTAL HYSTERECTOMY;  Surgeon: [name], MD;  [facility];  Service: Gynecology;  Laterality: Bilateral;  . SENTINEL NODE BIOPSY Bilateral 11/23/2016   Procedure: SENTINEL NODE BIOPSY;  Surgeon: [name], MD;  [facility];  Service: Gynecology;  Laterality: Bilateral;  . SHOULDER SURGERY Left 03/2015   by dr [name] at [facility]  04/1996    Current Outpatient Medications  Medication Sig Dispense Refill  . ibuprofen (ADVIL,MOTRIN) 200 MG tablet Take by mouth 2 (two) times daily as needed for moderate pain.     Marland Kitchen levothyroxine (SYNTHROID) 150 MCG tablet     . OVER THE COUNTER MEDICATION Take 5 tablets by mouth 3 (three) times a week. Pyridoxal 1, 2, 3, and 4 otc supplement  Vitamin package    . OVER THE COUNTER  MEDICATION Take 1 tablet by mouth as directed. Sulfur 24 g7 otc supplement take 1 tablet daily 5 days out of the week  Adrenaline gland support    . OVER THE COUNTER MEDICATION Take 1 capsule by mouth See admin instructions. Carbon 90 bx otc supplement take 1 capsule daily 5 days out of the week  Omega 3 supplement    . UNABLE TO FIND Med Name: Theotis Burrow - pt takes for neuropathy     No current facility-administered medications for this visit.    Social History   Socioeconomic History  . Marital status: Married    Spouse name: Joe  . Number of children: 0  . Years of education: 42  . Highest education level: Not on file  Occupational History  . Occupation: retired  Tobacco Use  . Smoking status: Never Smoker  . Smokeless tobacco: Never Used  Vaping Use  . Vaping Use: Never used  Substance and Sexual Activity  . Alcohol use: Yes    Comment: social, 2 glasses of wine per week  . Drug use: No  . Sexual activity: Not on file  Other Topics Concern  . Not on file  Social History Narrative   She retired from Building control surveyor in 2015.   She lives  with husband.  No children.   Highest level of education:  PhD in leadership studies, MBA   Social Determinants of Health   Financial Resource Strain:   . Difficulty of Paying Living Expenses:   Food Insecurity:   . Worried About Charity fundraiser in the Last Year:   . Arboriculturist in the Last Year:   Transportation Needs:   . Film/video editor (Medical):   Marland Kitchen Lack of Transportation (Non-Medical):   Physical Activity:   . Days of Exercise per Week:   . Minutes of Exercise per Session:   Stress:   . Feeling of Stress :   Social Connections:   . Frequency of Communication with Friends and Family:   . Frequency of Social Gatherings with Friends and Family:   . Attends Religious Services:   . Active Member of Clubs or Organizations:   . Attends Archivist Meetings:   Marland Kitchen Marital Status:   Intimate  Partner Violence:   . Fear of Current or Ex-Partner:   . Emotionally Abused:   Marland Kitchen Physically Abused:   . Sexually Abused:     Family History  Problem Relation Age of Onset  . Neurologic Disorder Mother        Trigeminal neuralgia  . Alzheimer's disease Mother   . Diabetes Father   . Breast cancer Sister     Thereasa Solo, MD 09/04/2019, 3:24 PM

## 2019-09-04 NOTE — Patient Instructions (Signed)
Please notify Dr Denman George at phone number [phone] if you notice vaginal bleeding, new pelvic or abdominal pains, bloating, feeling full easy, or a change in bladder or bowel function.   Please return to see Dr Denman George in December.

## 2020-02-05 DIAGNOSIS — M2011 Hallux valgus (acquired), right foot: Secondary | ICD-10-CM | POA: Diagnosis not present

## 2020-02-11 DIAGNOSIS — M2021 Hallux rigidus, right foot: Secondary | ICD-10-CM | POA: Diagnosis not present

## 2020-03-03 DIAGNOSIS — Z1231 Encounter for screening mammogram for malignant neoplasm of breast: Secondary | ICD-10-CM | POA: Diagnosis not present

## 2020-03-17 NOTE — Progress Notes (Signed)
Follow-up Note: Gyn-Onc   [name] 69 y.o. female  Chief Complaint  Patient presents with  . Endometrial cancer ([place])    Assessment :Endometrioid adenocarcinoma the endometrium (grade 3), stage IIIC 1. MSI stable. Treatment complted January, 2019. 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 No evidence of recurrence.   Plan:  S/p systemic therapy with 6 cycles of carboplatin and paclitaxel with vaginal brachytherapy to assist with local control with therapy completed in January, 2019.  We continue 6 monthly evaluations with an exam every 6 months. I will see her back in June, 2022.  HPI: 69 year old white married female seen in consultation at the request of [name] regarding management of a newly diagnosed endometrial carcinoma. The patient had some postmenopausal bleeding in December 2017 which by February had abated. Then returned more recently with heavier bleeding. Patient was found to have cervical stenosis and therefore underwent a formal D&C 10/15/2016. She was found to have a large endometrial polyp the grade 2 endometrial carcinoma.  On 11/23/16 she underwent robotic assisted total hysterectomy, BSO, SLN biopsy. Final pathology revealed a 6cm grade 3 endometrioid tumor with 1.3cm of 1.9cm myo invasion, no LVSI. Negative adnexae, but 1of 2 SLN's was positive for macrometastatic disease (from the left external iliac node).  In accordance with NCCN guidelines she was counseled to receive adjuvant chemotherapy and vaginal brachytherapy which she received between October. 2018 and January, 2019. 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  Of interest, her neuropathy first developed in 2010 after treatment with antibiotics for a pelvic infection. After chemotherapy in 2018 it became worse.   Interval Hx: She has been taking natural supplements suppled by [name].   She has no symptoms concerning for  recurrence.   She has stable lower extremity distal neuropathy (in soles of feet). She has begun taking OTC Neurvive for this which has been associated with some decreased sugar cravings and weight loss. She has learnt about vitamin injections into the feet which she is exploring in [place]. She has planned left foot (toe) surgery in January, 2022).   Review of Systems:10 point review of systems is negative except as noted in interval history.   Vitals: Blood pressure 109/78, pulse 79, temperature (!) 97 F (36.1 C), temperature source Tympanic, resp. rate 18, height '5\' 9"'  (1.753 m), weight 151 lb 3.2 oz (68.6 kg), SpO2 97 %.  Physical Exam: General : The patient is a healthy woman in no acute distress.  HEENT: normocephalic, extraoccular movements normal; neck is supple without thyromegally  Lynphnodes: Supraclavicular and inguinal nodes not enlarged  Abdomen: Soft, non-tender, no ascites, no organomegally, no masses, no hernias, incisions well healed Pelvic:  EGBUS: Normal female  Vagina: normal vaginal cuff. No bleeding or lesions. No palpable masses.  Bi-manual examination: Non-tender; no adenxal masses or nodularity  Rectal: normal sphincter tone, no masses, no blood  Lower extremities: No edema or varicosities. Normal range of motion      No Known Allergies  Past Medical History:  Diagnosis Date  . Cancer ([place]) 1998   thyroid ca  . Degenerative arthritis of toe joint, right    great toe  . Endometrial cancer ([place])   . Endometrial mass 10/15/2016  . History of radiation therapy 01/18/17-02/23/17   vaginal cuff 30 Gy in 5 fractions  . Hypothyroidism   . Neuromuscular disorder (HCC)    neuropathy  . Neuropathy    feet  . Postmenopausal  bleeding 10/15/2016    Past Surgical History:  Procedure Laterality Date  . ABDOMINAL HYSTERECTOMY    . ABDOMINOPLASTY    . APPENDECTOMY    . BUNIONECTOMY WITH CHILECTOMY Right 03/03/2016   Procedure: Lillard Anes WITH CHILECTOMY;   Surgeon: [name], MD;  Location: [facility];  Service: Orthopedics;  Laterality: Right;  . CHEILECTOMY Right 03/03/2016   Procedure: CHEILECTOMY RIGHT GREAT TOE;  Surgeon: [name], MD;  Location: [facility];  Service: Orthopedics;  Laterality: Right;  . COLONOSCOPY    . COSMETIC SURGERY    . DILATATION & CURETTAGE/HYSTEROSCOPY WITH MYOSURE N/A 10/15/2016   Procedure: DILATATION & CURETTAGE/HYSTEROSCOPY WITH MYOSURE;  Surgeon: [name], MD;  Location: [facility];  Service: Gynecology;  Laterality: N/A;  Requests Microdilators and Lacrimal Duct Dilators  . IR FLUORO GUIDE PORT INSERTION RIGHT  12/23/2016  . IR REMOVAL TUN ACCESS W/ PORT W/O FL MOD SED  05/20/2017  . IR US GUIDE VASC ACCESS RIGHT  12/23/2016  . OVARIAN CYST REMOVAL  1976  . ROBOTIC ASSISTED BILATERAL SALPINGO OOPHERECTOMY Bilateral 11/23/2016   Procedure: XI ROBOTIC ASSISTED LEFT OOPHORECTOMY;  Surgeon: [name], MD;  [facility];  Service: Gynecology;  Laterality: Bilateral;  . ROBOTIC ASSISTED TOTAL HYSTERECTOMY Bilateral 11/23/2016   Procedure: XI ROBOTIC ASSISTED TOTAL HYSTERECTOMY;  Surgeon: [name], MD;  [facility];  Service: Gynecology;  Laterality: Bilateral;  . SENTINEL NODE BIOPSY Bilateral 11/23/2016   Procedure: SENTINEL NODE BIOPSY;  Surgeon: [name], MD;  [facility];  Service: Gynecology;  Laterality: Bilateral;  . SHOULDER SURGERY Left 03/2015   by dr [name] at [place]  04/1996    Current Outpatient Medications  Medication Sig Dispense Refill  . ibuprofen (ADVIL,MOTRIN) 200 MG tablet Take by mouth 2 (two) times daily as needed for moderate pain.     Marland Kitchen levothyroxine (SYNTHROID) 150 MCG tablet     . OVER THE COUNTER MEDICATION Take 5 tablets by mouth 3 (three) times a week. Pyridoxal 1, 2, 3, and 4 otc supplement  Vitamin package    . OVER THE COUNTER MEDICATION Take 1 tablet by mouth as directed. Sulfur 24 g7 otc supplement take 1 tablet daily  5 days out of the week  Adrenaline gland support    . OVER THE COUNTER MEDICATION Take 1 capsule by mouth See admin instructions. Carbon 90 bx otc supplement take 1 capsule daily 5 days out of the week  Omega 3 supplement    . UNABLE TO FIND Med Name: Theotis Burrow - pt takes for neuropathy     No current facility-administered medications for this visit.    Social History   Socioeconomic History  . Marital status: Married    Spouse name: Joe  . Number of children: 0  . Years of education: 12  . Highest education level: Not on file  Occupational History  . Occupation: retired  Tobacco Use  . Smoking status: Never Smoker  . Smokeless tobacco: Never Used  Vaping Use  . Vaping Use: Never used  Substance and Sexual Activity  . Alcohol use: Yes    Comment: social, 2 glasses of wine per week  . Drug use: No  . Sexual activity: Not on file  Other Topics Concern  . 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  No children.   Highest level of education:  PhD in leadership  studies, MBA   Social Determinants of Health   Financial Resource Strain: Not on file  Food Insecurity: Not on file  Transportation Needs: Not on file  Physical Activity: Not on file  Stress: Not on file  Social Connections: Not on file  Intimate Partner Violence: Not on file    Family History  Problem Relation Age of Onset  . Neurologic Disorder Mother        Trigeminal neuralgia  . Alzheimer's disease Mother   . Diabetes Father   . Breast cancer Sister     Thereasa Solo, MD 03/20/2020, 4:26 PM

## 2020-03-20 ENCOUNTER — Other Ambulatory Visit: Payer: Self-pay

## 2020-03-20 ENCOUNTER — Encounter: Payer: Self-pay | Admitting: Gynecologic Oncology

## 2020-03-20 ENCOUNTER — Inpatient Hospital Stay: Payer: Medicare PPO | Attending: Gynecologic Oncology | Admitting: Gynecologic Oncology

## 2020-03-20 VITALS — BP 109/78 | HR 79 | Temp 97.0°F | Resp 18 | Ht 69.0 in | Wt 151.2 lb

## 2020-03-20 DIAGNOSIS — Z08 Encounter for follow-up examination after completed treatment for malignant neoplasm: Secondary | ICD-10-CM

## 2020-03-20 DIAGNOSIS — Z9221 Personal history of antineoplastic chemotherapy: Secondary | ICD-10-CM | POA: Insufficient documentation

## 2020-03-20 DIAGNOSIS — Z923 Personal history of irradiation: Secondary | ICD-10-CM | POA: Insufficient documentation

## 2020-03-20 DIAGNOSIS — C541 Malignant neoplasm of endometrium: Secondary | ICD-10-CM

## 2020-03-20 DIAGNOSIS — Z90710 Acquired absence of both cervix and uterus: Secondary | ICD-10-CM | POA: Insufficient documentation

## 2020-03-20 DIAGNOSIS — Z8542 Personal history of malignant neoplasm of other parts of uterus: Secondary | ICD-10-CM | POA: Diagnosis not present

## 2020-03-20 DIAGNOSIS — Z90722 Acquired absence of ovaries, bilateral: Secondary | ICD-10-CM | POA: Diagnosis not present

## 2020-03-20 NOTE — Patient Instructions (Signed)
Please notify Dr Amil Bouwman at phone number [phone] if you notice vaginal bleeding, new pelvic or abdominal pains, bloating, feeling full easy, or a change in bladder or bowel function.   Please return to see Dr Kelechi Astarita in June as scheduled.  

## 2020-03-27 DIAGNOSIS — G8918 Other acute postprocedural pain: Secondary | ICD-10-CM | POA: Diagnosis not present

## 2020-03-27 DIAGNOSIS — M2021 Hallux rigidus, right foot: Secondary | ICD-10-CM | POA: Diagnosis not present

## 2020-04-09 DIAGNOSIS — M2021 Hallux rigidus, right foot: Secondary | ICD-10-CM | POA: Diagnosis not present

## 2020-04-09 DIAGNOSIS — Z9889 Other specified postprocedural states: Secondary | ICD-10-CM | POA: Diagnosis not present

## 2020-04-22 DIAGNOSIS — Z20822 Contact with and (suspected) exposure to covid-19: Secondary | ICD-10-CM | POA: Diagnosis not present

## 2020-04-22 DIAGNOSIS — Z03818 Encounter for observation for suspected exposure to other biological agents ruled out: Secondary | ICD-10-CM | POA: Diagnosis not present

## 2020-05-07 DIAGNOSIS — Z981 Arthrodesis status: Secondary | ICD-10-CM | POA: Diagnosis not present

## 2020-05-07 DIAGNOSIS — Z4789 Encounter for other orthopedic aftercare: Secondary | ICD-10-CM | POA: Diagnosis not present

## 2020-05-08 ENCOUNTER — Telehealth: Payer: Self-pay | Admitting: *Deleted

## 2020-05-08 NOTE — Telephone Encounter (Signed)
Patient called and stated "I have been having deep pelvic area pain for five days. It comes and goes, but when it comes the pain level is at a 6. I have no bleeding, fullness feeling, bloating, fever or N/V. I can be reached at home after 3:30 pm or you can call before and leave a message."

## 2020-05-08 NOTE — Telephone Encounter (Signed)
[name] denies early satiety,change in bladder habits or bowel movements. No burning with urination,urgency, or frequency. Pain occurs in pelvis behind the mons pubis into groin. Pain is similar to what she experienced with pelvic infections,last in 2010. Information will be reviewed with Melissa Cross,NP. Will call her with recommendations.

## 2020-05-09 ENCOUNTER — Telehealth: Payer: Self-pay | Admitting: *Deleted

## 2020-05-09 ENCOUNTER — Other Ambulatory Visit: Payer: Self-pay | Admitting: Gynecologic Oncology

## 2020-05-09 DIAGNOSIS — C541 Malignant neoplasm of endometrium: Secondary | ICD-10-CM

## 2020-05-09 DIAGNOSIS — M25559 Pain in unspecified hip: Secondary | ICD-10-CM

## 2020-05-09 DIAGNOSIS — R1030 Lower abdominal pain, unspecified: Secondary | ICD-10-CM

## 2020-05-09 NOTE — Telephone Encounter (Signed)
Scheduled the patient for lab appt on Monday at 1:30 pm. Patient will also pick up her contrast for the CT scan.   CT scan scheduled for 2/23 at 6 pm; arrive at 5:45 pm. Patient given instructions to drink the first bottle at 4 pm and the second bottle at 5 pm. Also to be NPO after 2 pm.

## 2020-05-09 NOTE — Progress Notes (Signed)
See RN note. CT ordered for evaluate new persistent "deep pelvic pain". Given hx of Stage IIIC endo ca, plan for eval for recurrence.

## 2020-05-12 ENCOUNTER — Telehealth: Payer: Self-pay

## 2020-05-12 ENCOUNTER — Inpatient Hospital Stay: Payer: Medicare PPO | Attending: Gynecologic Oncology

## 2020-05-12 ENCOUNTER — Other Ambulatory Visit: Payer: Self-pay

## 2020-05-12 DIAGNOSIS — C541 Malignant neoplasm of endometrium: Secondary | ICD-10-CM

## 2020-05-12 DIAGNOSIS — Z8542 Personal history of malignant neoplasm of other parts of uterus: Secondary | ICD-10-CM | POA: Diagnosis not present

## 2020-05-12 DIAGNOSIS — R1030 Lower abdominal pain, unspecified: Secondary | ICD-10-CM

## 2020-05-12 LAB — URINALYSIS, COMPLETE (UACMP) WITH MICROSCOPIC
Bacteria, UA: NONE SEEN
Bilirubin Urine: NEGATIVE
Glucose, UA: NEGATIVE mg/dL
Hgb urine dipstick: NEGATIVE
Ketones, ur: 5 mg/dL — AB
Leukocytes,Ua: NEGATIVE
Nitrite: NEGATIVE
Protein, ur: NEGATIVE mg/dL
Specific Gravity, Urine: 1.017 (ref 1.005–1.030)
pH: 5 (ref 5.0–8.0)

## 2020-05-12 LAB — BASIC METABOLIC PANEL
Anion gap: 8 (ref 5–15)
BUN: 24 mg/dL — ABNORMAL HIGH (ref 8–23)
CO2: 26 mmol/L (ref 22–32)
Calcium: 9.2 mg/dL (ref 8.9–10.3)
Chloride: 105 mmol/L (ref 98–111)
Creatinine, Ser: 0.77 mg/dL (ref 0.44–1.00)
GFR, Estimated: >60 mL/min (ref >60)
Glucose, Bld: 95 mg/dL (ref 70–99)
Potassium: 4.3 mmol/L (ref 3.5–5.1)
Sodium: 139 mmol/L (ref 135–145)

## 2020-05-12 NOTE — Telephone Encounter (Signed)
Told Ms Clausing the results of the Urinalysis as noted below by [facility]

## 2020-05-12 NOTE — Telephone Encounter (Signed)
-----   Message from Dorothyann Gibbs, NP sent at 05/12/2020  3:43 PM EST ----- Urinalysis does not suggest an infection ----- Message ----- From: Buel Ream, Lab In [place] Sent: 05/12/2020   2:21 PM EST To: Dorothyann Gibbs, NP

## 2020-05-14 ENCOUNTER — Ambulatory Visit (HOSPITAL_COMMUNITY)
Admission: RE | Admit: 2020-05-14 | Discharge: 2020-05-14 | Disposition: A | Discharge status: Home / Self Care | Payer: Medicare PPO | Source: Ambulatory Visit | Attending: Gynecologic Oncology | Admitting: Gynecologic Oncology

## 2020-05-14 ENCOUNTER — Other Ambulatory Visit: Payer: Self-pay

## 2020-05-14 DIAGNOSIS — R1030 Lower abdominal pain, unspecified: Secondary | ICD-10-CM | POA: Diagnosis not present

## 2020-05-14 DIAGNOSIS — C541 Malignant neoplasm of endometrium: Secondary | ICD-10-CM | POA: Insufficient documentation

## 2020-05-14 DIAGNOSIS — K5730 Diverticulosis of large intestine without perforation or abscess without bleeding: Secondary | ICD-10-CM | POA: Diagnosis not present

## 2020-05-14 DIAGNOSIS — K6389 Other specified diseases of intestine: Secondary | ICD-10-CM | POA: Diagnosis not present

## 2020-05-14 DIAGNOSIS — Z8542 Personal history of malignant neoplasm of other parts of uterus: Secondary | ICD-10-CM | POA: Diagnosis not present

## 2020-05-14 DIAGNOSIS — R102 Pelvic and perineal pain: Secondary | ICD-10-CM | POA: Diagnosis not present

## 2020-05-14 MED ORDER — IOHEXOL 300 MG/ML  SOLN
100.0000 mL | Freq: Once | INTRAMUSCULAR | Status: AC | PRN
  Administered 2020-05-14: 100 mL via INTRAVENOUS

## 2020-05-16 ENCOUNTER — Telehealth: Payer: Self-pay | Admitting: Gynecologic Oncology

## 2020-05-16 ENCOUNTER — Telehealth: Payer: Self-pay

## 2020-05-16 DIAGNOSIS — R933 Abnormal findings on diagnostic imaging of other parts of digestive tract: Secondary | ICD-10-CM

## 2020-05-16 DIAGNOSIS — K639 Disease of intestine, unspecified: Secondary | ICD-10-CM

## 2020-05-16 NOTE — Telephone Encounter (Signed)
Spoke with patient about CT scan results. She states she had a colonoscopy at age 70 and at age 66 with Dr. Collene Mares. Advised we would place a referral for her to be further evaluated with Dr. Collene Mares given the mass-like area noted in the colon on CT scan. All questions answered. Advised she would be contacted with a date for the referral.

## 2020-05-16 NOTE — Telephone Encounter (Signed)
Told [name] that her records were sent to Dr. Lorie Apley office. They will review the records and call her to set up an appointment. Pt verbalized understanding.

## 2020-05-16 NOTE — Telephone Encounter (Signed)
Faxed records to Dr.Mann's office([phone]) for an urgent appointment for F/U of areas of concern for colonic neoplasm on CT scan A/P on 05-14-20. Dr. Collene Mares will review records and the office will call her to set up an appointment.

## 2020-05-16 NOTE — Telephone Encounter (Signed)
Attempted to call patient with CT scan results. Left message on home voicemail and unable to reach on mobile.

## 2020-05-20 DIAGNOSIS — Z1211 Encounter for screening for malignant neoplasm of colon: Secondary | ICD-10-CM | POA: Diagnosis not present

## 2020-05-20 DIAGNOSIS — R933 Abnormal findings on diagnostic imaging of other parts of digestive tract: Secondary | ICD-10-CM | POA: Diagnosis not present

## 2020-05-20 DIAGNOSIS — Z800 Family history of malignant neoplasm of digestive organs: Secondary | ICD-10-CM | POA: Diagnosis not present

## 2020-05-20 DIAGNOSIS — K5730 Diverticulosis of large intestine without perforation or abscess without bleeding: Secondary | ICD-10-CM | POA: Diagnosis not present

## 2020-05-26 DIAGNOSIS — R935 Abnormal findings on diagnostic imaging of other abdominal regions, including retroperitoneum: Secondary | ICD-10-CM | POA: Diagnosis not present

## 2020-05-26 DIAGNOSIS — K5730 Diverticulosis of large intestine without perforation or abscess without bleeding: Secondary | ICD-10-CM | POA: Diagnosis not present

## 2020-05-26 DIAGNOSIS — Z1211 Encounter for screening for malignant neoplasm of colon: Secondary | ICD-10-CM | POA: Diagnosis not present

## 2020-05-26 DIAGNOSIS — R933 Abnormal findings on diagnostic imaging of other parts of digestive tract: Secondary | ICD-10-CM | POA: Diagnosis not present

## 2020-06-04 DIAGNOSIS — Z981 Arthrodesis status: Secondary | ICD-10-CM | POA: Diagnosis not present

## 2020-06-04 DIAGNOSIS — Z4789 Encounter for other orthopedic aftercare: Secondary | ICD-10-CM | POA: Diagnosis not present

## 2020-06-17 DIAGNOSIS — R7989 Other specified abnormal findings of blood chemistry: Secondary | ICD-10-CM | POA: Diagnosis not present

## 2020-06-17 DIAGNOSIS — E039 Hypothyroidism, unspecified: Secondary | ICD-10-CM | POA: Diagnosis not present

## 2020-06-17 DIAGNOSIS — R5383 Other fatigue: Secondary | ICD-10-CM | POA: Diagnosis not present

## 2020-06-17 DIAGNOSIS — E559 Vitamin D deficiency, unspecified: Secondary | ICD-10-CM | POA: Diagnosis not present

## 2020-06-17 DIAGNOSIS — E7210 Disorders of sulfur-bearing amino-acid metabolism, unspecified: Secondary | ICD-10-CM | POA: Diagnosis not present

## 2020-06-24 DIAGNOSIS — E039 Hypothyroidism, unspecified: Secondary | ICD-10-CM | POA: Diagnosis not present

## 2020-07-09 DIAGNOSIS — M79671 Pain in right foot: Secondary | ICD-10-CM | POA: Diagnosis not present

## 2020-07-14 DIAGNOSIS — E039 Hypothyroidism, unspecified: Secondary | ICD-10-CM | POA: Diagnosis not present

## 2020-09-08 ENCOUNTER — Other Ambulatory Visit: Payer: Self-pay

## 2020-09-08 ENCOUNTER — Inpatient Hospital Stay: Payer: Medicare PPO | Attending: Gynecologic Oncology | Admitting: Gynecologic Oncology

## 2020-09-08 VITALS — BP 128/49 | HR 70 | Temp 97.4°F | Resp 18 | Ht 69.0 in | Wt 156.5 lb

## 2020-09-08 DIAGNOSIS — Z79899 Other long term (current) drug therapy: Secondary | ICD-10-CM | POA: Insufficient documentation

## 2020-09-08 DIAGNOSIS — C541 Malignant neoplasm of endometrium: Secondary | ICD-10-CM

## 2020-09-08 DIAGNOSIS — Z8542 Personal history of malignant neoplasm of other parts of uterus: Secondary | ICD-10-CM | POA: Insufficient documentation

## 2020-09-08 DIAGNOSIS — Z79890 Hormone replacement therapy (postmenopausal): Secondary | ICD-10-CM | POA: Insufficient documentation

## 2020-09-08 DIAGNOSIS — Z90722 Acquired absence of ovaries, bilateral: Secondary | ICD-10-CM | POA: Diagnosis not present

## 2020-09-08 DIAGNOSIS — Z923 Personal history of irradiation: Secondary | ICD-10-CM | POA: Insufficient documentation

## 2020-09-08 DIAGNOSIS — Z85850 Personal history of malignant neoplasm of thyroid: Secondary | ICD-10-CM | POA: Insufficient documentation

## 2020-09-08 DIAGNOSIS — E039 Hypothyroidism, unspecified: Secondary | ICD-10-CM | POA: Insufficient documentation

## 2020-09-08 DIAGNOSIS — G629 Polyneuropathy, unspecified: Secondary | ICD-10-CM | POA: Insufficient documentation

## 2020-09-08 DIAGNOSIS — G709 Myoneural disorder, unspecified: Secondary | ICD-10-CM | POA: Insufficient documentation

## 2020-09-08 DIAGNOSIS — Z90710 Acquired absence of both cervix and uterus: Secondary | ICD-10-CM | POA: Diagnosis not present

## 2020-09-08 NOTE — Patient Instructions (Signed)
Please notify Dr Denman George at phone number [phone] if you notice vaginal bleeding, new pelvic or abdominal pains, bloating, feeling full easy, or a change in bladder or bowel function.   Dr Denman George is departing the [place] at [facility] in October, 2022. Her partners and colleagues including Dr Berline Lopes, Dr Delsa Sale and Joylene John, Nurse Practitioner will be available to continue your care.   You are next scheduled to return to the Gynecologic Oncology office at the [facility] in December with Joylene John, NP.

## 2020-09-08 NOTE — Progress Notes (Signed)
Follow-up Note: Gyn-Onc   [name] 70 y.o. female  Chief Complaint  Patient presents with   Endometrial cancer [facility])    Assessment :Endometrioid adenocarcinoma the endometrium (grade 3), stage IIIC 1. MSI stable. Treatment completed January, 2019. 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 No evidence of recurrence.   Plan:  S/p systemic therapy with 6 cycles of carboplatin and paclitaxel with vaginal brachytherapy to assist with local control with therapy completed in January, 2019.  We continue 6 monthly evaluations with an exam every 6 months. I informed the patient that I will be leaving the practice in the fall of 2022, however, my partner, Joylene John, NP will be able to see her for these visits moving forward.  HPI: 70 year old white married female seen in consultation at the request of [name] regarding management of a newly diagnosed endometrial carcinoma. The patient had some postmenopausal bleeding in December 2017 which by February had abated. Then returned more recently with heavier bleeding. Patient was found to have cervical stenosis and therefore underwent a formal D&C 10/15/2016. She was found to have a large endometrial polyp the grade 2 endometrial carcinoma.  On 11/23/16 she underwent robotic assisted total hysterectomy, BSO, SLN biopsy. Final pathology revealed a 6cm grade 3 endometrioid tumor with 1.3cm of 1.9cm myo invasion, no LVSI. Negative adnexae, but 1of 2 SLN's was positive for macrometastatic disease (from the left external iliac node).  In accordance with NCCN guidelines she was counseled to receive adjuvant chemotherapy and vaginal brachytherapy which she received between October. 2018 and January, 2019. 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  Of interest, her neuropathy first developed in 2010 after treatment with antibiotics for a pelvic infection. After chemotherapy in 2018 it became  worse.   Interval Hx: She has been taking natural supplements suppled by [name].   She has no symptoms concerning for recurrence.   She did, however, develop left lower quadrant pain in March, 2022 and underwent a CT scan which diagnosed diverticulitis.   She has stable lower extremity distal neuropathy (in soles of feet). She has begun taking OTC Neurvive for this which has been associated with some decreased sugar cravings and weight loss. She has learnt about vitamin injections into the feet which she is exploring in [place].   She is now using Blueprint therapy for nitric oxide stimulation and the rebuilder therapy for stimulation of her nerves to overcome neuropathy.   Review of Systems:10 point review of systems is negative except as noted in interval history.   Vitals: Blood pressure (!) 128/49, pulse 70, temperature (!) 97.4 F (36.3 C), temperature source Tympanic, resp. rate 18, height 5' 9" (1.753 m), weight 156 lb 8 oz (71 kg), SpO2 99 %.  Physical Exam: General : The patient is a healthy woman in no acute distress.  HEENT: normocephalic, extraoccular movements normal; neck is supple without thyromegally  Lynphnodes: Supraclavicular and inguinal nodes not enlarged  Abdomen: Soft, non-tender, no ascites, no organomegally, no masses, no hernias, incisions well healed Pelvic:  EGBUS: Normal female  Vagina: normal vaginal cuff. No bleeding or lesions. No palpable masses.  Bi-manual examination: Non-tender; no adenxal masses or nodularity  Rectal: normal sphincter tone, no masses, no blood  Lower extremities: No edema or varicosities. Normal range of motion      No Known Allergies  Past Medical History:  Diagnosis Date   Cancer ([place]) 1998   thyroid ca  Degenerative arthritis of toe joint, right    great toe   Endometrial cancer ([place])    Endometrial mass 10/15/2016   History of radiation therapy 01/18/17-02/23/17   vaginal cuff 30 Gy in 5 fractions    Hypothyroidism    Neuromuscular disorder ([facility])    neuropathy   Neuropathy    feet   Postmenopausal bleeding 10/15/2016    Past Surgical History:  Procedure Laterality Date   ABDOMINAL HYSTERECTOMY     ABDOMINOPLASTY     APPENDECTOMY     BUNIONECTOMY WITH CHILECTOMY Right 03/03/2016   Procedure: Lillard Anes WITH CHILECTOMY;  Surgeon: [name], MD;  Location: [place];  Service: Orthopedics;  Laterality: Right;   CHEILECTOMY Right 03/03/2016   Procedure: CHEILECTOMY RIGHT GREAT TOE;  Surgeon: [name], MD;  Location: [place];  Service: Orthopedics;  Laterality: Right;   COLONOSCOPY     COSMETIC SURGERY     DILATATION & CURETTAGE/HYSTEROSCOPY WITH MYOSURE N/A 10/15/2016   Procedure: DILATATION & CURETTAGE/HYSTEROSCOPY WITH MYOSURE;  Surgeon: [name], MD;  Location: [facility];  Service: Gynecology;  Laterality: N/A;  Requests Microdilators and Lacrimal Duct Dilators   IR FLUORO GUIDE PORT INSERTION RIGHT  12/23/2016   IR REMOVAL TUN ACCESS W/ PORT W/O FL MOD SED  05/20/2017   IR US GUIDE VASC ACCESS RIGHT  12/23/2016   OVARIAN CYST REMOVAL  1976   ROBOTIC ASSISTED BILATERAL SALPINGO OOPHERECTOMY Bilateral 11/23/2016   Procedure: XI ROBOTIC ASSISTED LEFT OOPHORECTOMY;  Surgeon: [name], MD;  Location: [facility];  Service: Gynecology;  Laterality: Bilateral;   ROBOTIC ASSISTED TOTAL HYSTERECTOMY Bilateral 11/23/2016   Procedure: XI ROBOTIC ASSISTED TOTAL HYSTERECTOMY;  Surgeon: [name], MD;  Location: [facility];  Service: Gynecology;  Laterality: Bilateral;   SENTINEL NODE BIOPSY Bilateral 11/23/2016   Procedure: SENTINEL NODE BIOPSY;  Surgeon: [name], MD;  Location: [facility];  Service: Gynecology;  Laterality: Bilateral;   SHOULDER SURGERY Left 03/2015   by dr [name] at [place]  04/1996    Current Outpatient Medications  Medication Sig Dispense Refill   ibuprofen (ADVIL,MOTRIN) 200 MG tablet Take by mouth 2 (two) times daily as needed  for moderate pain.      levothyroxine (SYNTHROID) 150 MCG tablet      OVER THE COUNTER MEDICATION Take 5 tablets by mouth 3 (three) times a week. Pyridoxal 1, 2, 3, and 4 otc supplement  Vitamin package     OVER THE COUNTER MEDICATION Take 1 tablet by mouth as directed. Sulfur 24 g7 otc supplement take 1 tablet daily 5 days out of the week  Adrenaline gland support     OVER THE COUNTER MEDICATION Take 1 capsule by mouth See admin instructions. Carbon 90 bx otc supplement take 1 capsule daily 5 days out of the week  Omega 3 supplement     UNABLE TO FIND Med Name: nervive - pt takes for neuropathy     No current facility-administered medications for this visit.    Social History   Socioeconomic History   Marital status: Married    Spouse name: Joe   Number of children: 0   Years of education: 20   Highest education level: Not on file  Occupational History   Occupation: retired  Tobacco Use   Smoking status: Never   Smokeless tobacco: Never  Vaping Use   Vaping Use: Never used  Substance and Sexual Activity   Alcohol use: Yes    Comment: social, 2  glasses of wine per week   Drug use: No   Sexual activity: Not on file  Other Topics Concern   Not on file  Social History Narrative   She retired from career development coordinator in 2015.   She lives with husband.  No children.   Highest level of education:  PhD in leadership studies, MBA   Social Determinants of Health   Financial Resource Strain: Not on file  Food Insecurity: Not on file  Transportation Needs: Not on file  Physical Activity: Not on file  Stress: Not on file  Social Connections: Not on file  Intimate Partner Violence: Not on file    Family History  Problem Relation Age of Onset   Neurologic Disorder Mother        Trigeminal neuralgia   Alzheimer's disease Mother    Diabetes Father    Breast cancer Sister     Emma C Rossi, MD 09/08/2020, 3:23 PM       

## 2020-10-24 DIAGNOSIS — H00015 Hordeolum externum left lower eyelid: Secondary | ICD-10-CM | POA: Diagnosis not present

## 2020-10-30 DIAGNOSIS — H0015 Chalazion left lower eyelid: Secondary | ICD-10-CM | POA: Diagnosis not present

## 2020-11-13 DIAGNOSIS — H2513 Age-related nuclear cataract, bilateral: Secondary | ICD-10-CM | POA: Diagnosis not present

## 2020-11-13 DIAGNOSIS — H0015 Chalazion left lower eyelid: Secondary | ICD-10-CM | POA: Diagnosis not present

## 2020-11-13 DIAGNOSIS — H52203 Unspecified astigmatism, bilateral: Secondary | ICD-10-CM | POA: Diagnosis not present

## 2020-12-23 DIAGNOSIS — E559 Vitamin D deficiency, unspecified: Secondary | ICD-10-CM | POA: Diagnosis not present

## 2020-12-23 DIAGNOSIS — R5383 Other fatigue: Secondary | ICD-10-CM | POA: Diagnosis not present

## 2020-12-23 DIAGNOSIS — R7989 Other specified abnormal findings of blood chemistry: Secondary | ICD-10-CM | POA: Diagnosis not present

## 2020-12-23 DIAGNOSIS — E039 Hypothyroidism, unspecified: Secondary | ICD-10-CM | POA: Diagnosis not present

## 2020-12-23 DIAGNOSIS — E7210 Disorders of sulfur-bearing amino-acid metabolism, unspecified: Secondary | ICD-10-CM | POA: Diagnosis not present

## 2020-12-30 DIAGNOSIS — E039 Hypothyroidism, unspecified: Secondary | ICD-10-CM | POA: Diagnosis not present

## 2021-01-20 DIAGNOSIS — S83282A Other tear of lateral meniscus, current injury, left knee, initial encounter: Secondary | ICD-10-CM | POA: Diagnosis not present

## 2021-01-26 DIAGNOSIS — M25562 Pain in left knee: Secondary | ICD-10-CM | POA: Diagnosis not present

## 2021-01-29 DIAGNOSIS — M25562 Pain in left knee: Secondary | ICD-10-CM | POA: Diagnosis not present

## 2021-02-06 DIAGNOSIS — M79662 Pain in left lower leg: Secondary | ICD-10-CM | POA: Diagnosis not present

## 2021-02-09 DIAGNOSIS — R350 Frequency of micturition: Secondary | ICD-10-CM | POA: Diagnosis not present

## 2021-02-19 DIAGNOSIS — M25572 Pain in left ankle and joints of left foot: Secondary | ICD-10-CM | POA: Diagnosis not present

## 2021-02-19 DIAGNOSIS — M25562 Pain in left knee: Secondary | ICD-10-CM | POA: Diagnosis not present

## 2021-02-25 DIAGNOSIS — M79662 Pain in left lower leg: Secondary | ICD-10-CM | POA: Diagnosis not present

## 2021-03-03 DIAGNOSIS — M79662 Pain in left lower leg: Secondary | ICD-10-CM | POA: Diagnosis not present

## 2021-03-05 DIAGNOSIS — M79662 Pain in left lower leg: Secondary | ICD-10-CM | POA: Diagnosis not present

## 2021-03-10 DIAGNOSIS — M79662 Pain in left lower leg: Secondary | ICD-10-CM | POA: Diagnosis not present

## 2021-03-12 DIAGNOSIS — M79662 Pain in left lower leg: Secondary | ICD-10-CM | POA: Diagnosis not present

## 2021-03-13 ENCOUNTER — Telehealth: Payer: Self-pay

## 2021-03-13 NOTE — Telephone Encounter (Signed)
Received call from pt needing to reschedule appt with Joylene John, NP on 03/19/21. Pt has been rescheduled to 04/07/2021 at 1:00 pm. Pt is agreeable to new appt date and time.

## 2021-03-19 ENCOUNTER — Ambulatory Visit: Payer: Medicare PPO | Admitting: Gynecologic Oncology

## 2021-03-25 DIAGNOSIS — M79662 Pain in left lower leg: Secondary | ICD-10-CM | POA: Diagnosis not present

## 2021-04-06 ENCOUNTER — Telehealth: Payer: Self-pay

## 2021-04-06 NOTE — Telephone Encounter (Signed)
Patient called and states she [place] unable to come to her appointment tomorrow 04/07/21 with Joylene John, NP. Patient reports she is having some issues with her legs and would like to reschedule for mid February. Appointment rescheduled for 05/12/21 at 1:00 pm. Patient is in agreement of appointment date and time. Instructed to call with any needs.

## 2021-04-07 ENCOUNTER — Ambulatory Visit: Payer: Medicare PPO | Admitting: Gynecologic Oncology

## 2021-04-30 ENCOUNTER — Ambulatory Visit: Payer: Medicare PPO | Admitting: Internal Medicine

## 2021-05-12 ENCOUNTER — Inpatient Hospital Stay: Payer: Medicare PPO | Attending: Gynecologic Oncology | Admitting: Gynecologic Oncology

## 2021-05-12 ENCOUNTER — Encounter: Payer: Self-pay | Admitting: Gynecologic Oncology

## 2021-05-12 ENCOUNTER — Other Ambulatory Visit: Payer: Self-pay

## 2021-05-12 VITALS — BP 132/87 | HR 90 | Temp 98.1°F | Resp 14 | Ht 68.11 in | Wt 156.4 lb

## 2021-05-12 DIAGNOSIS — G709 Myoneural disorder, unspecified: Secondary | ICD-10-CM | POA: Insufficient documentation

## 2021-05-12 DIAGNOSIS — G620 Drug-induced polyneuropathy: Secondary | ICD-10-CM | POA: Insufficient documentation

## 2021-05-12 DIAGNOSIS — Z90710 Acquired absence of both cervix and uterus: Secondary | ICD-10-CM | POA: Diagnosis not present

## 2021-05-12 DIAGNOSIS — Z90722 Acquired absence of ovaries, bilateral: Secondary | ICD-10-CM | POA: Insufficient documentation

## 2021-05-12 DIAGNOSIS — E039 Hypothyroidism, unspecified: Secondary | ICD-10-CM | POA: Insufficient documentation

## 2021-05-12 DIAGNOSIS — Z85850 Personal history of malignant neoplasm of thyroid: Secondary | ICD-10-CM | POA: Insufficient documentation

## 2021-05-12 DIAGNOSIS — T451X5A Adverse effect of antineoplastic and immunosuppressive drugs, initial encounter: Secondary | ICD-10-CM

## 2021-05-12 DIAGNOSIS — Z8542 Personal history of malignant neoplasm of other parts of uterus: Secondary | ICD-10-CM | POA: Diagnosis not present

## 2021-05-12 DIAGNOSIS — Z791 Long term (current) use of non-steroidal anti-inflammatories (NSAID): Secondary | ICD-10-CM | POA: Insufficient documentation

## 2021-05-12 DIAGNOSIS — Z9221 Personal history of antineoplastic chemotherapy: Secondary | ICD-10-CM | POA: Insufficient documentation

## 2021-05-12 DIAGNOSIS — C541 Malignant neoplasm of endometrium: Secondary | ICD-10-CM

## 2021-05-12 DIAGNOSIS — Z923 Personal history of irradiation: Secondary | ICD-10-CM | POA: Diagnosis not present

## 2021-05-12 DIAGNOSIS — S8992XA Unspecified injury of left lower leg, initial encounter: Secondary | ICD-10-CM

## 2021-05-12 NOTE — Progress Notes (Signed)
Gynecologic Oncology Follow Up   [name] 71 y.o. female  Chief Complaint  Patient presents with   Endometrial cancer 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 MSI stable. Treatment including surgery, chemotherapy, and radiation completed January, 2019. 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 No evidence of recurrence on examination.   Plan: 71 year old female s/p surgery on 11/23/2016 along with systemic therapy with 6 cycles of carboplatin and paclitaxel with vaginal brachytherapy to assist with local control with therapy completed in January, 2019.   Recommendation remains for 6 monthly evaluations with an exam every 6 months. Reportable signs and symptoms reviewed. She is advised to call for any needs or concerns or new symptoms.  HPI: Jonte Wollam is a 71 year old female initially seen in consultation at the request of [name] regarding management of a newly diagnosed endometrial carcinoma. She developed postmenopausal bleeding in December 2017, which eventually stopped by February. More recently, the bleeding returned and was heavier. She sought care with [name], gynecologist. She was noted to have cervical stenosis on examination and therefore underwent a formal D&C 10/15/2016. Procedural findings included a large endometrial polyp that returned as a grade 2 endometrial carcinoma.  On 11/23/16, she underwent robotic assisted total hysterectomy, BSO, SLN biopsy with Dr. [name]. Operative findings included a 6 cm normal appearing uterus, surgically absent right ovary and bilateral fallopian tubes, adhesions between the left ovary and left ovarian fossa, and no suspicious nodes. Final pathology revealed a 6 cm grade 3 endometrioid tumor with 1.3cm of 1.9cm myo invasion, no LVSI. Negative adnexae, but 1of 2 SLN's was positive for macrometastatic disease (from the left external iliac node).  On 12/09/2016, she had a PET scan to  assess for signs of metastatic disease. The PET scan revealed no evidence of residual or metastatic carcinoma, a single left cervical lymph node that was nonspecific and considered reactive, sigmoid diverticulosis, and cholelithiasis.  In accordance with NCCN guidelines, she was counseled to receive adjuvant chemotherapy and vaginal brachytherapy which she received between October 2018 and January 2019 under the guidance of Dr. Sondra Come in Radiation Oncology and Dr. Heath Lark with Medical Oncology.  She developed severe neuropathy of the feet with Taxol. Prior to this, she had a history of neuropathy first developed in 2010 after treatment with antibiotics for a pelvic infection with chemotherapy in 2018 worsening her symptoms.  A CT AP was performed in February 2022 for evaluation of new deep pelvic/abdominal pain with no findings of metastatic or recurrent disease at that time, extensive colonic diverticulosis with long segment sigmoid colonic wall thickening and a suspicious focal area of mass-like wall thickening in the distal sigmoid colon. Based on the colon findings, she sought further evaluation with Dr. Collene Mares with gastroenterology. A diagnostic colonoscopy was performed on May 26, 2020 with no stricture or mass noted in the sigmoid colon as described on CT scan, moderate diverticulosis. A repeat colonscopy in 10 years for surveillance was recommended.  Interval Hx: The patient presents to the office today for continued follow-up.  She reports having a left leg injury while playing golf in the middle of October 2022.  She reports having torn ligaments in her left ankle, fibula, knee, and bottom of the leg where it intersects with the foot.  During this time and for the months following, she reports having a horrible time with severe difficulty walking, excruciating pain, and spasms in the left leg. She was under  the care of Dr. Jaynie Collins, her wellness MD along with her chiropractor, Dr. Radford Pax.  She  started a homeopathic medicine called Cos on February 15, 2021 recommended by Dr. Jaynie Collins. This was supposed to strengthen the bladder, relieve pain, support healing of muscles/ligaments/tendons, and support nerves.  In mid December, it was also recommended to add an additional homeopathic medication called Limok.  She took this medication on December 20 along with 5 COS and experienced a horrific reaction per patient.  She reports being able to put her left heel down flat on March 13, 2021, which showed improvement.  On December 31, she stopped taking the Limok due to having severe inflammation and tightness.  In mid January 2023, she started taking a medication called Okra Pepsin recommended by her chiropractor.  She feels like this "was the last piece of the puzzle."  She was able to go out to eat for the first time in February, has been running errands, and now driving short distances.  She has been taking meloxicam through this time and she feels that this has brought her considerable relief from her neuropathy symptoms.  She had also been using muscle relaxers as needed and had been decreasing the amount with her last dose being on May 03, 2021.  She states that Dr. Jaynie Collins feels the root cause of her recent issues has been her past chemotherapy, which has compromised her immune system. See media for document provided by patient of the above events.  She feels like for the past 2 weeks, her symptoms are getting better.  She is overdue for mammogram and has plans to get this scheduled since she is feeling better.  No changes in appetite or diet or unintentional weight loss or gain.  No abdominal pain reported. She states after having radiation she had some urinary symptoms of urgency but this has improved as well with taking Cos.  No vaginal bleeding or discharge reported.  Bowels are functioning without difficulty with no rectal bleeding reported.   Her neuropathy symptoms are stable and improved  with meloxicam and her homeopathic therapies. She always wears shoes when walking about, which helps with the discomfort and provides stability. She has mild lower extremity edema noted in the left leg around the level of the ankle noted to start after the injury. It is encouraging that she is starting to get out more since her symptoms have improved. No reported symptoms concerning for recurrence.   Review of Systems: Constitutional: Feels she is improving. No fever, chills, early satiety, unintentional change in weight, change in appetite.  Cardiovascular: No chest pain, shortness of breath. Mild edema in left lower extremity due to recent injury.  Pulmonary: No new cough or wheeze.  Gastrointestinal: No nausea, vomiting, or diarrhea. No bright red blood per rectum or change in bowel movement.  Genitourinary: Post-radiation bladder symptoms appear to be improving with Cos supplement. No new frequency, urgency, or dysuria. No vaginal bleeding or discharge.  Musculoskeletal: Left lower leg discomfort, decreased mobility is improving. Mobic has provided relief for neuropathy symptoms. Neurologic: No new weakness. + for neuropathy.  Psychology: No new symptoms of depression, anxiety. Additional review of systems is negative. See interval history.  Health Maintenance: Mammogram: She has these at [facility]. She has plans to schedule this soon. Colonoscopy: 05/26/2020 with Dr. Collene Mares  Vitals: Blood pressure 132/87, pulse 90, temperature 98.1 F (36.7 C), temperature source Oral, resp. rate 14, height 5' 8.11" (1.73 m), weight 156 lb 6.4 oz (70.9  kg), SpO2 99 %.  Physical Exam: General: Well developed, well nourished female in no acute distress. Alert and oriented x 3.  Neck: Supple without any enlargements.  Lymph node survey: No cervical, supraclavicular, or inguinal adenopathy.  Cardiovascular: Regular rate and rhythm. S1 and S2 normal.  Lungs: Clear to auscultation bilaterally. No  wheezes/crackles/rhonchi noted.  Skin: No rashes or lesions present. Back: No CVA tenderness.  Abdomen: Abdomen soft, non-tender and non-obese. Active bowel sounds in all quadrants. No evidence of a fluid wave or abdominal masses. Incisions well healed without hernias or nodularity.  Genitourinary:    Vulva/vagina: Normal external female genitalia. No lesions.    Urethra: No lesions or masses.    Vagina: Atrophic without any lesions. Radiation changes noted at the well healed vaginal cuff. No palpable masses. No vaginal bleeding or drainage noted.  Rectal: Good tone, no masses, no cul de sac nodularity.  Extremities: No bilateral cyanosis or clubbing. Trace edema in LLE around ankle from recent injury.   No Known Allergies  Past Medical History:  Diagnosis Date   Cancer ([place]) 1998   thyroid ca   Degenerative arthritis of toe joint, right    great toe   Endometrial cancer ([place])    Endometrial mass 10/15/2016   History of radiation therapy 01/18/17-02/23/17   vaginal cuff 30 Gy in 5 fractions   Hypothyroidism    Neuromuscular disorder ([facility])    neuropathy   Neuropathy    feet   Postmenopausal bleeding 10/15/2016    Past Surgical History:  Procedure Laterality Date   ABDOMINAL HYSTERECTOMY     ABDOMINOPLASTY     APPENDECTOMY     BUNIONECTOMY WITH CHILECTOMY Right 03/03/2016   Procedure: Lillard Anes WITH CHILECTOMY;  Surgeon: [name], MD;  Location: [facility];  Service: Orthopedics;  Laterality: Right;   CHEILECTOMY Right 03/03/2016   Procedure: CHEILECTOMY RIGHT GREAT TOE;  Surgeon: [name], MD;  Location: [facility];  Service: Orthopedics;  Laterality: Right;   COLONOSCOPY     COSMETIC SURGERY     DILATATION & CURETTAGE/HYSTEROSCOPY WITH MYOSURE N/A 10/15/2016   Procedure: DILATATION & CURETTAGE/HYSTEROSCOPY WITH MYOSURE;  Surgeon: [name], MD;  Location: [facility];  Service: Gynecology;  Laterality: N/A;  Requests Microdilators and  Lacrimal Duct Dilators   IR FLUORO GUIDE PORT INSERTION RIGHT  12/23/2016   IR REMOVAL TUN ACCESS W/ PORT W/O FL MOD SED  05/20/2017   IR US GUIDE VASC ACCESS RIGHT  12/23/2016   OVARIAN CYST REMOVAL  1976   ROBOTIC ASSISTED BILATERAL SALPINGO OOPHERECTOMY Bilateral 11/23/2016   Procedure: XI ROBOTIC ASSISTED LEFT OOPHORECTOMY;  Surgeon: [name], MD;  Location: [facility];  Service: Gynecology;  Laterality: Bilateral;   ROBOTIC ASSISTED TOTAL HYSTERECTOMY Bilateral 11/23/2016   Procedure: XI ROBOTIC ASSISTED TOTAL HYSTERECTOMY;  Surgeon: [name], MD;  Location: [facility];  Service: Gynecology;  Laterality: Bilateral;   SENTINEL NODE BIOPSY Bilateral 11/23/2016   Procedure: SENTINEL NODE BIOPSY;  Surgeon: [name], MD;  Location: [facility];  Service: Gynecology;  Laterality: Bilateral;   SHOULDER SURGERY Left 03/2015   by dr [name] at [facility]  04/1996    Current Outpatient Medications  Medication Sig Dispense Refill   cyclobenzaprine (FLEXERIL) 10 MG tablet Take by mouth.     ibuprofen (ADVIL,MOTRIN) 200 MG tablet Take by mouth 2 (two) times daily as needed for moderate pain.      levothyroxine (SYNTHROID) 137 MCG tablet Take by mouth.  OVER THE COUNTER MEDICATION Take 5 tablets by mouth 3 (three) times a week. Pyridoxal 1, 2, 3, and 4 otc supplement  Vitamin package     OVER THE COUNTER MEDICATION Take 1 tablet by mouth as directed. Sulfur 24 g7 otc supplement take 1 tablet daily 5 days out of the week  Adrenaline gland support     OVER THE COUNTER MEDICATION Take 1 capsule by mouth See admin instructions. Carbon 90 bx otc supplement take 1 capsule daily 5 days out of the week  Omega 3 supplement     OVER THE COUNTER MEDICATION CALM 361m BID     meloxicam (MOBIC) 7.5 MG tablet Take 1 tablet (7.5 mg total) by mouth daily. 30 tablet 0   UNABLE TO FIND Med Name: nervive - pt takes for neuropathy     No current facility-administered medications for this visit.    Social History    Socioeconomic History   Marital status: Married    Spouse name: Joe   Number of children: 0   Years of education: 20   Highest education level: Not on file  Occupational History   Occupation: retired  Tobacco Use   Smoking status: Never   Smokeless tobacco: Never  Vaping Use   Vaping Use: Never used  Substance and Sexual Activity   Alcohol use: Yes    Comment: social, 2 glasses of wine per week   Drug use: No   Sexual activity: Not on file  Other Topics Concern   Not on file  Social History Narrative   She retired from [employer].   She lives with husband.  No children.   Highest level of education:  PhD in leadership studies, MBA   Social Determinants of Health   Financial Resource Strain: Not on file  Food Insecurity: Not on file  Transportation Needs: Not on file  Physical Activity: Not on file  Stress: Not on file  Social Connections: Not on file  Intimate Partner Violence: Not on file    Family History  Problem Relation Age of Onset   Neurologic Disorder Mother        Trigeminal neuralgia   Alzheimer's disease Mother    Diabetes Father    Breast cancer Sister     MDorothyann Gibbs MD 05/14/2021, 2:26 PM

## 2021-05-12 NOTE — Patient Instructions (Signed)
No concerning findings on today's examination. Plan to follow up in six months or sooner if needed. We will plan to see you in the early fall of 2023 and beginning of the year in 2024 then you will be able to graduate for endometrial cancer surveillance. Please call our office in June or early July to arrange for an appointment in early fall 2023 at [phone].  I appreciate you providing the information for the treatments you have received over the past several months and am happy to hear that your symptoms are improving.   Symptoms to report to your health care team include vaginal bleeding, rectal bleeding, bloating, weight loss without effort, new and persistent pain, new and  persistent fatigue, new leg swelling, new masses (i.e., bumps in your neck or groin), new and persistent cough, new and persistent nausea and vomiting, change in bowel or bladder habits, and any other concerns.

## 2021-05-13 ENCOUNTER — Encounter: Payer: Self-pay | Admitting: Gynecologic Oncology

## 2021-05-13 ENCOUNTER — Telehealth: Payer: Self-pay | Admitting: *Deleted

## 2021-05-13 ENCOUNTER — Telehealth: Payer: Self-pay

## 2021-05-13 NOTE — Telephone Encounter (Signed)
Called patient and LM regarding Mobic refill request and side effects.

## 2021-05-13 NOTE — Telephone Encounter (Signed)
Called and left the patient a message to call the office back for a follow up appt

## 2021-05-13 NOTE — Telephone Encounter (Signed)
Patient called back and scheduled a follow up appt with Joylene John APP for 8/8 at 10:30 am. Patient stated "Do you think Joylene John, APP would refill my meloxicam. I was given it by Dr Tanna Furry office for a torn ligament. But is helps with the neuropathy in my feet from chemotherapy. I have had a lot of out of pocket expenses lately, and just don't want to have to go to his office and pay a $40 co pay. If you she would write a 6 month prescription?" Explained that the message would be given to [facility] APP and the office would call her back later today. Pharmacy verified with the patient

## 2021-05-14 ENCOUNTER — Other Ambulatory Visit: Payer: Self-pay | Admitting: Gynecologic Oncology

## 2021-05-14 DIAGNOSIS — G620 Drug-induced polyneuropathy: Secondary | ICD-10-CM

## 2021-05-14 DIAGNOSIS — M79605 Pain in left leg: Secondary | ICD-10-CM

## 2021-05-14 DIAGNOSIS — S8992XA Unspecified injury of left lower leg, initial encounter: Secondary | ICD-10-CM | POA: Insufficient documentation

## 2021-05-14 DIAGNOSIS — T451X5A Adverse effect of antineoplastic and immunosuppressive drugs, initial encounter: Secondary | ICD-10-CM

## 2021-05-14 MED ORDER — MELOXICAM 7.5 MG PO TABS: 7.5000 mg | ORAL_TABLET | Freq: Every day | ORAL | 0 refills | Status: DC

## 2021-05-14 NOTE — Progress Notes (Signed)
See mychart note. Pt has been taking mobic and tolerating well with improvement in her neuropathy symptoms and left leg pain s/p injury. The potential serious side effects were sent to the patient for review. Advised this would be a short term prescription.

## 2021-05-20 ENCOUNTER — Ambulatory Visit: Payer: Medicare PPO | Admitting: Internal Medicine

## 2021-06-03 DIAGNOSIS — Z1231 Encounter for screening mammogram for malignant neoplasm of breast: Secondary | ICD-10-CM | POA: Diagnosis not present

## 2021-06-15 ENCOUNTER — Other Ambulatory Visit: Payer: Self-pay | Admitting: Gynecologic Oncology

## 2021-06-15 DIAGNOSIS — M79605 Pain in left leg: Secondary | ICD-10-CM

## 2021-06-15 DIAGNOSIS — G620 Drug-induced polyneuropathy: Secondary | ICD-10-CM

## 2021-06-15 MED ORDER — MELOXICAM 10 MG PO CAPS: 10.0000 mg | ORAL_CAPSULE | Freq: Every day | ORAL | 0 refills | Status: DC

## 2021-06-15 NOTE — Progress Notes (Signed)
See mychart message. Patient is aware of the potential side effects of mobic. She has severe peripheral neuropathy and states the 7.5 mg did not work for her symptoms. Requesting refill on 10 mg tablets. Advised there would need to be a plan to transition to something safer to take daily and long term. ?

## 2021-06-16 NOTE — Telephone Encounter (Signed)
Attempted to contact pt regarding her request to refill Meloxicam. Unable to reach pt at this time. LVM requesting a call back.  ?

## 2021-06-17 ENCOUNTER — Telehealth: Payer: Self-pay

## 2021-06-17 ENCOUNTER — Other Ambulatory Visit: Payer: Self-pay | Admitting: Gynecologic Oncology

## 2021-06-17 DIAGNOSIS — G620 Drug-induced polyneuropathy: Secondary | ICD-10-CM

## 2021-06-17 MED ORDER — MELOXICAM 15 MG PO TABS: 15.0000 mg | ORAL_TABLET | Freq: Every day | ORAL | 0 refills | Status: DC

## 2021-06-17 NOTE — Telephone Encounter (Signed)
Attempted to follow up with patient regarding her prescription refill for Mobic. Mobic '15mg'$  was called into her pharmacy. This medication has been on a rolling blackout and her pharmacy does not have it in stock and they don't know when they will have it. The pharmacist suggested the patient pick up the prescription for the 7.5 mg mobic and take 2 tablets.  ?Unable to contact patient to inform her of this. Left message requesting a return call.  ?

## 2021-06-19 ENCOUNTER — Encounter: Payer: Self-pay | Admitting: Gynecologic Oncology

## 2021-06-29 ENCOUNTER — Other Ambulatory Visit: Payer: Self-pay | Admitting: Gynecologic Oncology

## 2021-06-29 DIAGNOSIS — R52 Pain, unspecified: Secondary | ICD-10-CM

## 2021-06-29 DIAGNOSIS — G620 Drug-induced polyneuropathy: Secondary | ICD-10-CM

## 2021-06-29 MED ORDER — MELOXICAM 15 MG PO TABS: 15.0000 mg | ORAL_TABLET | Freq: Every day | ORAL | 1 refills | Status: DC

## 2021-06-29 NOTE — Progress Notes (Signed)
See mychart message. Patient is requesting additional refills on mobic along with a referral with Leeroy Cha at [facility] for possible Qutenza application for peripheral neuropathy pain symptoms in bilateral lower extremities. ?

## 2021-07-01 DIAGNOSIS — R5383 Other fatigue: Secondary | ICD-10-CM | POA: Diagnosis not present

## 2021-07-01 DIAGNOSIS — E7210 Disorders of sulfur-bearing amino-acid metabolism, unspecified: Secondary | ICD-10-CM | POA: Diagnosis not present

## 2021-07-01 DIAGNOSIS — R7989 Other specified abnormal findings of blood chemistry: Secondary | ICD-10-CM | POA: Diagnosis not present

## 2021-07-01 DIAGNOSIS — E039 Hypothyroidism, unspecified: Secondary | ICD-10-CM | POA: Diagnosis not present

## 2021-07-01 DIAGNOSIS — E559 Vitamin D deficiency, unspecified: Secondary | ICD-10-CM | POA: Diagnosis not present

## 2021-07-07 DIAGNOSIS — E039 Hypothyroidism, unspecified: Secondary | ICD-10-CM | POA: Diagnosis not present

## 2021-07-08 ENCOUNTER — Telehealth: Payer: Self-pay | Admitting: *Deleted

## 2021-07-08 NOTE — Telephone Encounter (Signed)
Called and left the patient a message to call the office back. Patient needs to be scheduled for PT; that office has reached out to the patient with no return call. Patient can call that office at [phone] (PT at [facility])  ?

## 2021-07-10 ENCOUNTER — Other Ambulatory Visit: Payer: Self-pay | Admitting: Gynecologic Oncology

## 2021-07-10 DIAGNOSIS — Z791 Long term (current) use of non-steroidal anti-inflammatories (NSAID): Secondary | ICD-10-CM

## 2021-07-10 MED ORDER — PANTOPRAZOLE SODIUM 20 MG PO TBEC: 20.0000 mg | DELAYED_RELEASE_TABLET | Freq: Every day | ORAL | 3 refills | Status: DC

## 2021-07-13 ENCOUNTER — Telehealth: Payer: Self-pay | Admitting: *Deleted

## 2021-07-13 NOTE — Telephone Encounter (Signed)
Attempted to speak with pt to inform her that we spoke with the referral office and it's under review and they will reach out to her to make an appt. Unable to reach pt. LVM for return call.  ?

## 2021-07-13 NOTE — Telephone Encounter (Signed)
Spoke with pt this morning and informed her that we spoke with the referral office and they fixed the referral and it's under medical review and someone will reach out to her from that office to schedule an appointment. Pt verbalized understanding.  ?

## 2021-07-13 NOTE — Telephone Encounter (Signed)
Spoke with referral office to investigate as to why the pt hasn't been able to get an appointment set up. They stated the referral has been corrected and is under medical review.  ?

## 2021-07-16 ENCOUNTER — Encounter: Payer: Self-pay | Admitting: Physical Medicine and Rehabilitation

## 2021-07-16 DIAGNOSIS — M7662 Achilles tendinitis, left leg: Secondary | ICD-10-CM | POA: Diagnosis not present

## 2021-07-18 DIAGNOSIS — E039 Hypothyroidism, unspecified: Secondary | ICD-10-CM | POA: Diagnosis not present

## 2021-08-05 DIAGNOSIS — E039 Hypothyroidism, unspecified: Secondary | ICD-10-CM | POA: Diagnosis not present

## 2021-09-03 DIAGNOSIS — T781XXA Other adverse food reactions, not elsewhere classified, initial encounter: Secondary | ICD-10-CM | POA: Diagnosis not present

## 2021-09-16 ENCOUNTER — Encounter: Payer: Self-pay | Admitting: Gynecologic Oncology

## 2021-09-17 ENCOUNTER — Other Ambulatory Visit: Payer: Self-pay | Admitting: Gynecologic Oncology

## 2021-10-06 DIAGNOSIS — R7989 Other specified abnormal findings of blood chemistry: Secondary | ICD-10-CM | POA: Diagnosis not present

## 2021-10-06 DIAGNOSIS — R5383 Other fatigue: Secondary | ICD-10-CM | POA: Diagnosis not present

## 2021-10-06 DIAGNOSIS — E7210 Disorders of sulfur-bearing amino-acid metabolism, unspecified: Secondary | ICD-10-CM | POA: Diagnosis not present

## 2021-10-06 DIAGNOSIS — M7672 Peroneal tendinitis, left leg: Secondary | ICD-10-CM | POA: Diagnosis not present

## 2021-10-06 DIAGNOSIS — M25572 Pain in left ankle and joints of left foot: Secondary | ICD-10-CM | POA: Diagnosis not present

## 2021-10-06 DIAGNOSIS — I7090 Unspecified atherosclerosis: Secondary | ICD-10-CM | POA: Diagnosis not present

## 2021-10-06 DIAGNOSIS — E559 Vitamin D deficiency, unspecified: Secondary | ICD-10-CM | POA: Diagnosis not present

## 2021-10-06 DIAGNOSIS — E039 Hypothyroidism, unspecified: Secondary | ICD-10-CM | POA: Diagnosis not present

## 2021-10-08 ENCOUNTER — Other Ambulatory Visit: Payer: Self-pay | Admitting: Gynecologic Oncology

## 2021-10-08 DIAGNOSIS — Z791 Long term (current) use of non-steroidal anti-inflammatories (NSAID): Secondary | ICD-10-CM

## 2021-10-13 DIAGNOSIS — T781XXA Other adverse food reactions, not elsewhere classified, initial encounter: Secondary | ICD-10-CM | POA: Diagnosis not present

## 2021-10-19 DIAGNOSIS — T781XXA Other adverse food reactions, not elsewhere classified, initial encounter: Secondary | ICD-10-CM | POA: Diagnosis not present

## 2021-10-26 DIAGNOSIS — T781XXA Other adverse food reactions, not elsewhere classified, initial encounter: Secondary | ICD-10-CM | POA: Diagnosis not present

## 2021-10-27 ENCOUNTER — Inpatient Hospital Stay: Payer: Medicare PPO | Attending: Gynecologic Oncology | Admitting: Gynecologic Oncology

## 2021-10-27 ENCOUNTER — Other Ambulatory Visit: Payer: Self-pay

## 2021-10-27 VITALS — BP 112/60 | HR 81 | Temp 98.0°F | Resp 18 | Ht 68.0 in | Wt 153.0 lb

## 2021-10-27 DIAGNOSIS — Z90710 Acquired absence of both cervix and uterus: Secondary | ICD-10-CM | POA: Insufficient documentation

## 2021-10-27 DIAGNOSIS — Z923 Personal history of irradiation: Secondary | ICD-10-CM | POA: Insufficient documentation

## 2021-10-27 DIAGNOSIS — Z90722 Acquired absence of ovaries, bilateral: Secondary | ICD-10-CM | POA: Diagnosis not present

## 2021-10-27 DIAGNOSIS — Z8542 Personal history of malignant neoplasm of other parts of uterus: Secondary | ICD-10-CM | POA: Diagnosis not present

## 2021-10-27 DIAGNOSIS — Z9221 Personal history of antineoplastic chemotherapy: Secondary | ICD-10-CM | POA: Insufficient documentation

## 2021-10-27 DIAGNOSIS — C541 Malignant neoplasm of endometrium: Secondary | ICD-10-CM

## 2021-10-27 NOTE — Patient Instructions (Signed)
Your exam was normal today. Plan to follow up in six months or sooner if needed. Please call for any needs, concerns, or new symptoms.   I am hopeful that the treatment Dr. Jaynie Collins has prescribed will work and help with improving your symptoms.Please keep [name] posted on how you are doing.

## 2021-10-29 NOTE — Progress Notes (Signed)
Gynecologic Oncology Follow Up   [name] 71 y.o. 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 MSI stable. Treatment including surgery, chemotherapy, and radiation completed January, 2019. 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 Jaynie Collins. No evidence of recurrence on examination today.   Plan: 71 year old female s/p surgery on 11/23/2016 along with systemic therapy with 6 cycles of carboplatin and paclitaxel with vaginal brachytherapy to assist with local control with therapy completed in January, 2019.   Recommendation remains for 6 monthly evaluations with an exam every 6 months until 5 years after completion of treatment. Reportable signs and symptoms reviewed. She is advised to call for any needs or concerns or new symptoms.  HPI: [name] is a 71 year old female initially seen in consultation at the request of [name] regarding management of a newly diagnosed endometrial carcinoma. She developed postmenopausal bleeding in December 2017, which eventually stopped by February. More recently, the bleeding returned and was heavier. She sought care with [name], gynecologist. She was noted to have cervical stenosis on examination and therefore underwent a formal D&C 10/15/2016. Procedural findings included a large endometrial polyp that returned as a grade 2 endometrial carcinoma.  On 11/23/16, she underwent robotic assisted total hysterectomy, BSO, SLN biopsy with Dr. [name]. Operative findings included a 6 cm normal appearing uterus, surgically absent right ovary and bilateral fallopian tubes, adhesions between the left ovary and left ovarian fossa, and no suspicious nodes. Final pathology revealed a 6 cm grade 3 endometrioid tumor with 1.3cm of 1.9cm myo invasion, no LVSI. Negative adnexae, but 1of 2 SLN's was positive for macrometastatic disease  (from the left external iliac node).  On 12/09/2016, she had a PET scan to assess for signs of metastatic disease. The PET scan revealed no evidence of residual or metastatic carcinoma, a single left cervical lymph node that was nonspecific and considered reactive, sigmoid diverticulosis, and cholelithiasis.  In accordance with NCCN guidelines, she was counseled to receive adjuvant chemotherapy and vaginal brachytherapy which she received between October 2018 and January 2019 under the guidance of Dr. Sondra Come in Radiation Oncology and Dr. Heath Lark with Medical Oncology.  She developed severe neuropathy of the feet with Taxol. Prior to this, she had a history of neuropathy first developed in 2010 after treatment with antibiotics for a pelvic infection with chemotherapy in 2018 worsening her symptoms.  A CT AP was performed in February 2022 for evaluation of new deep pelvic/abdominal pain with no findings of metastatic or recurrent disease at that time, extensive colonic diverticulosis with long segment sigmoid colonic wall thickening and a suspicious focal area of mass-like wall thickening in the distal sigmoid colon. Based on the colon findings, she sought further evaluation with Dr. Collene Mares with gastroenterology. A diagnostic colonoscopy was performed on May 26, 2020 with no stricture or mass noted in the sigmoid colon as described on CT scan, moderate diverticulosis. A repeat colonscopy in 10 years for surveillance was recommended.  Interval Hx: The patient has been going through challenges with her health since her last visit. She states starting in March of this year, she started experiencing mouth and feet burning when eating. She had pedal burning prior related to neuropathy and then foods would worsen this. She consulted with Dr. Jaynie Collins, who felt her blood was "filthy" related to past chemotherapy changes to the body, medication use, and not making  enough enzymes. She noticed the burning with eating  foods with oils as well and states she is able to use canola oil since it is the easiest to break down. She has started taking Lemdo under the guidance of Dr. Jaynie Collins. She has been taking 4 tablets a day with the goal to help build up her enzymes. She does this for 5 more days. She is hopeful about this treatment and states the pedal burning is improving. She has been following a diet in "What to Consume" stating there are 45 foods she can eat. She is adjusting to changes in meal prep/cooking.     In May, she was seeing her chiropractor and was using red light therapy, which seemed to worsen her symptoms. She started taking Seneca in May for 2 months to help strengthen her adrenals since Dr. Jaynie Collins felt these were low. In April, she saw her orthopedic provider with left ankle swelling. She eventually received an injection in the ankle. She has played golf but states she was unable to walk after. She has stopped taking meloxicam, PPI, and muscle relaxants since this was felt to worsen her symptoms.  No unintentional weight loss or gain.  No abdominal pain reported. No vaginal bleeding or discharge reported.  Bowels are functioning without difficulty with no rectal bleeding reported. No reported symptoms concerning for recurrence.   Review of Systems: Constitutional: Feels she is improving. No fever, chills, early satiety, unintentional change in weight, change in appetite.  Cardiovascular: No chest pain, shortness of breath. Mild edema in left lower extremity due to recent injury.  Pulmonary: No new cough or wheeze.  Gastrointestinal: No nausea, vomiting, or diarrhea. No bright red blood per rectum or change in bowel movement.  Genitourinary: Post-radiation bladder symptoms appear to be improved with Cos supplement. No new frequency, urgency, or dysuria. No vaginal bleeding or discharge.  Musculoskeletal: Left lower leg discomfort, decreased mobility is improving.  Neurologic: No new weakness. + for  neuropathy.  Psychology: No new symptoms of depression, anxiety. Additional review of systems is negative. See interval history.  Health Maintenance: Mammogram: She has these at [facility]. Last March 2023  Colonoscopy: 05/26/2020 with Dr. Collene Mares  Vitals: Blood pressure 112/60, pulse 81, temperature 98 F (36.7 C), temperature source Oral, resp. rate 18, height _0  (1.727 m), weight 153 lb (69.4 kg), SpO2 97 %.  Physical Exam: General: Well developed, well nourished female in no acute distress. Alert and oriented x 3.  Neck: Supple without any enlargements.  Lymph node survey: No cervical, supraclavicular, or inguinal adenopathy.  Cardiovascular: Regular rate and rhythm. S1 and S2 normal.  Lungs: Clear to auscultation bilaterally. No wheezes/crackles/rhonchi noted.  Skin: No rashes or lesions present. Back: No CVA tenderness.  Abdomen: Abdomen soft, non-tender and non-obese. Active bowel sounds in all quadrants. No evidence of a fluid wave or abdominal masses. Incisions well healed without hernias or nodularity.  Genitourinary:    Vulva/vagina: Normal external female genitalia. No lesions.    Urethra: No lesions or masses.    Vagina: Atrophic without any lesions. Radiation changes noted at the well healed vaginal cuff. No palpable masses. No vaginal bleeding or drainage noted.  Rectal: Good tone, no masses, no cul de sac nodularity.  Extremities: No bilateral cyanosis or clubbing. Trace edema in LLE around ankle from recent injury.   No Known Allergies  Past Medical History:  Diagnosis Date   Cancer ([place]) 1998   thyroid ca   Degenerative arthritis of toe  joint, right    great toe   Endometrial cancer ([place])    Endometrial mass 10/15/2016   History of radiation therapy 01/18/17-02/23/17   vaginal cuff 30 Gy in 5 fractions   Hypothyroidism    Neuromuscular disorder ([facility])    neuropathy   Neuropathy    feet   Postmenopausal bleeding 10/15/2016    Past Surgical History:   Procedure Laterality Date   ABDOMINAL HYSTERECTOMY     ABDOMINOPLASTY     APPENDECTOMY     BUNIONECTOMY WITH CHILECTOMY Right 03/03/2016   Procedure: Lillard Anes WITH CHILECTOMY;  Surgeon: [name], MD;  Location: [facility];  Service: Orthopedics;  Laterality: Right;   CHEILECTOMY Right 03/03/2016   Procedure: CHEILECTOMY RIGHT GREAT TOE;  Surgeon: [name], MD;  Location: [facility];  Service: Orthopedics;  Laterality: Right;   COLONOSCOPY     COSMETIC SURGERY     DILATATION & CURETTAGE/HYSTEROSCOPY WITH MYOSURE N/A 10/15/2016   Procedure: DILATATION & CURETTAGE/HYSTEROSCOPY WITH MYOSURE;  Surgeon: [name], MD;  Location: [facility];  Service: Gynecology;  Laterality: N/A;  Requests Microdilators and Lacrimal Duct Dilators   IR FLUORO GUIDE PORT INSERTION RIGHT  12/23/2016   IR REMOVAL TUN ACCESS W/ PORT W/O FL MOD SED  05/20/2017   IR US GUIDE VASC ACCESS RIGHT  12/23/2016   OVARIAN CYST REMOVAL  1976   ROBOTIC ASSISTED BILATERAL SALPINGO OOPHERECTOMY Bilateral 11/23/2016   Procedure: XI ROBOTIC ASSISTED LEFT OOPHORECTOMY;  Surgeon: [name], MD;  Location: [facility];  Service: Gynecology;  Laterality: Bilateral;   ROBOTIC ASSISTED TOTAL HYSTERECTOMY Bilateral 11/23/2016   Procedure: XI ROBOTIC ASSISTED TOTAL HYSTERECTOMY;  Surgeon: [name], MD;  Location: [facility];  Service: Gynecology;  Laterality: Bilateral;   SENTINEL NODE BIOPSY Bilateral 11/23/2016   Procedure: SENTINEL NODE BIOPSY;  Surgeon: [name], MD;  Location: [facility];  Service: Gynecology;  Laterality: Bilateral;   SHOULDER SURGERY Left 03/2015   by dr [name] at [place] [facility]  04/1996    Current Outpatient Medications  Medication Sig Dispense Refill   levothyroxine (SYNTHROID) 137 MCG tablet Take by mouth.     OVER THE COUNTER MEDICATION Take 5 tablets by mouth 3 (three) times a week. Pyridoxal 1, 2, 3, and 4 otc supplement  Vitamin package     OVER THE COUNTER MEDICATION Take 1  tablet by mouth as directed. Sulfur 24 g7 otc supplement take 1 tablet daily 5 days out of the week  Adrenaline gland support     OVER THE COUNTER MEDICATION Take 1 capsule by mouth See admin instructions. Carbon 90 bx otc supplement take 1 capsule daily 5 days out of the week  Omega 3 supplement     OVER THE COUNTER MEDICATION CALM 351m BID     UNABLE TO FIND Med Name: nervive - pt takes for neuropathy     No current facility-administered medications for this visit.    Social History   Socioeconomic History   Marital status: Married    Spouse name: Joe   Number of children: 0   Years of education: 20   Highest education level: Not on file  Occupational History   Occupation: retired  Tobacco Use   Smoking status: Never   Smokeless tobacco: Never  Vaping Use   Vaping Use: Never used  Substance and Sexual Activity   Alcohol use: Yes    Comment: social, 2 glasses of wine per week   Drug use: No   Sexual  activity: Not on file  Other Topics Concern   Not on file  Social History Narrative   She retired from career Doctor, hospital in 2015.   She lives with husband.  No children.   Highest level of education:  PhD in leadership studies, MBA   Social Determinants of Health   Financial Resource Strain: Not on file  Food Insecurity: Not on file  Transportation Needs: Not on file  Physical Activity: Not on file  Stress: Not on file  Social Connections: Not on file  Intimate Partner Violence: Not on file    Family History  Problem Relation Age of Onset   Neurologic Disorder Mother        Trigeminal neuralgia   Alzheimer's disease Mother    Diabetes Father    Breast cancer Sister     Dorothyann Gibbs, MD 10/29/2021, 5:27 PM

## 2021-11-17 DIAGNOSIS — T781XXA Other adverse food reactions, not elsewhere classified, initial encounter: Secondary | ICD-10-CM | POA: Diagnosis not present

## 2021-11-27 IMAGING — CT CT ABD-PELV W/ CM
2 of 5 series · 15 of 46 positions shown, 17 images · IV contrast (APPLIED)
Comparison: PET-CT December 09, 2016

CLINICAL DATA: New persistent deep pelvic/abdominal pain. History
stage IIIC endometrial cancer status post treatment.

EXAM:
CT ABDOMEN AND PELVIS WITH CONTRAST
TECHNIQUE: Multidetector CT imaging of the abdomen and pelvis was performed
using the standard protocol following bolus administration of
intravenous contrast.
CONTRAST:  100mL OMNIPAQUE IOHEXOL 300 MG/ML  SOLN

[Series 2: axial st · axial · 0.81mm/px · z∈[-788,-378]mm · 12 of 94 slices shown, 14 images]
[im 6/94  soft-tissue]
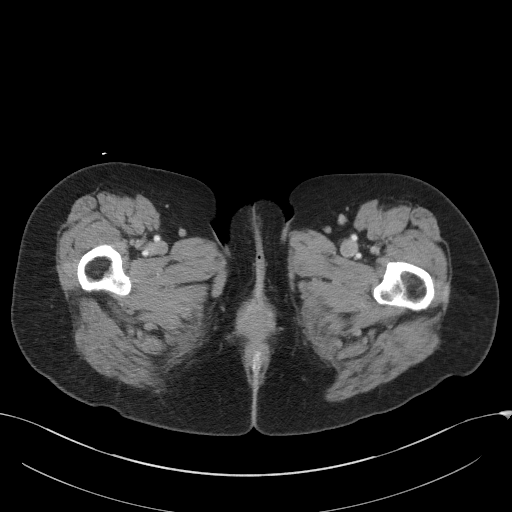
[im 6/94  bone]
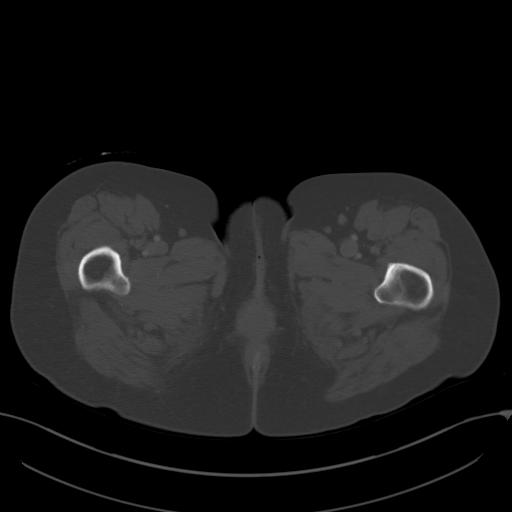
[im 12/94  soft-tissue]
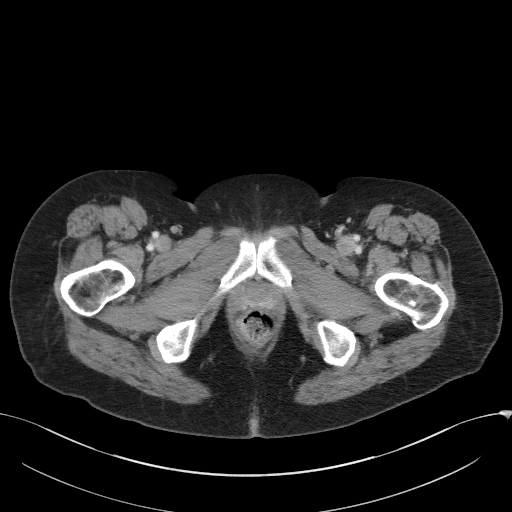
[im 24/94  soft-tissue]
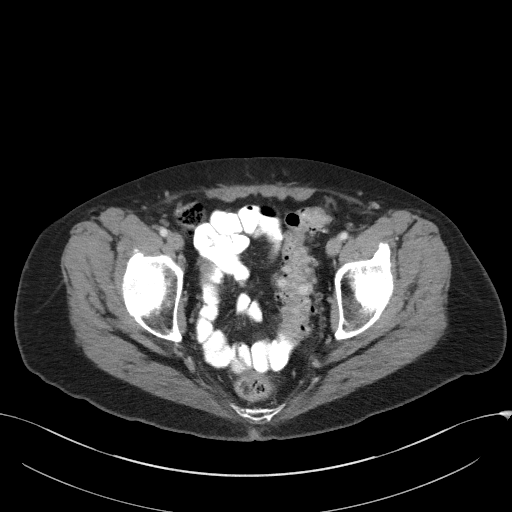
[im 30/94  soft-tissue]
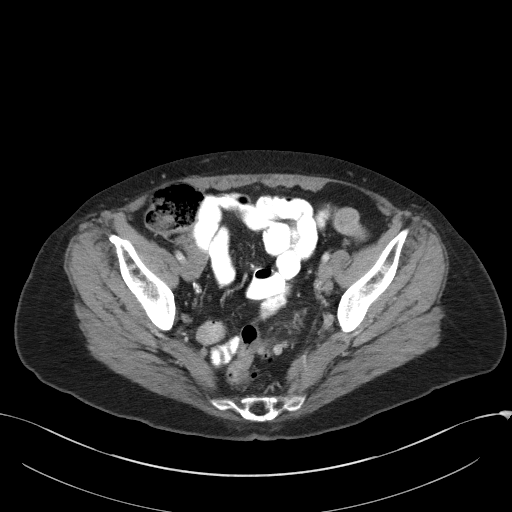
[im 35/94  soft-tissue]
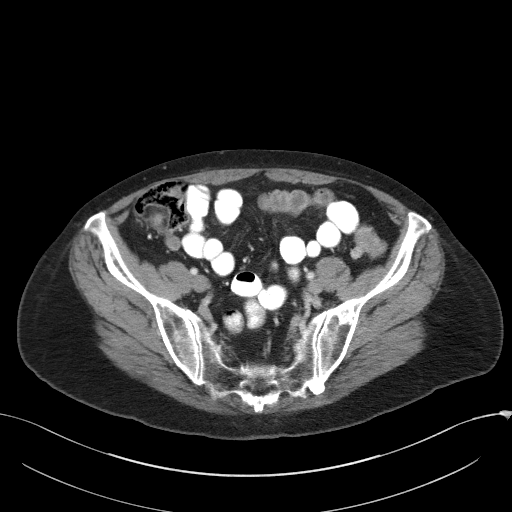
[im 41/94  soft-tissue]
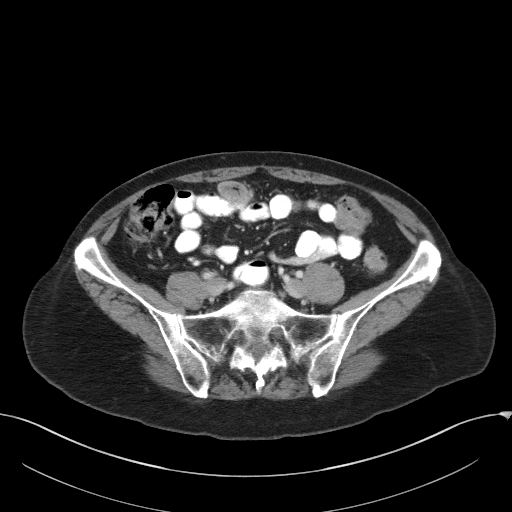
[im 53/94  soft-tissue]
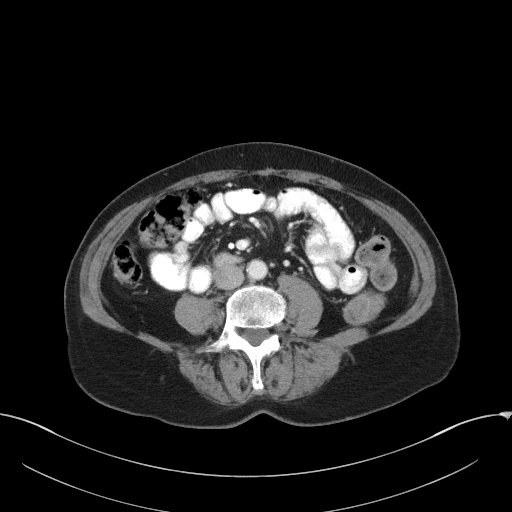
[im 59/94  soft-tissue]
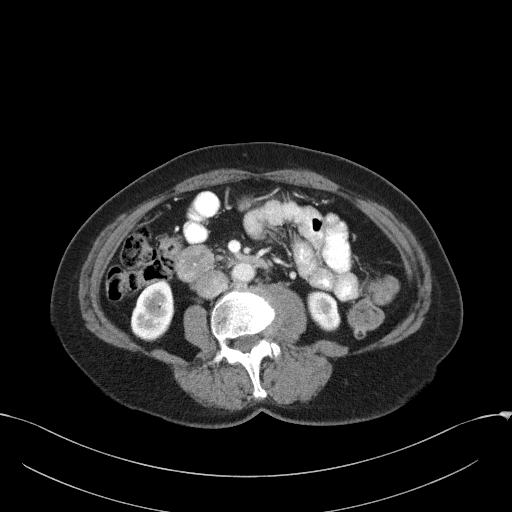
[im 64/94  soft-tissue]
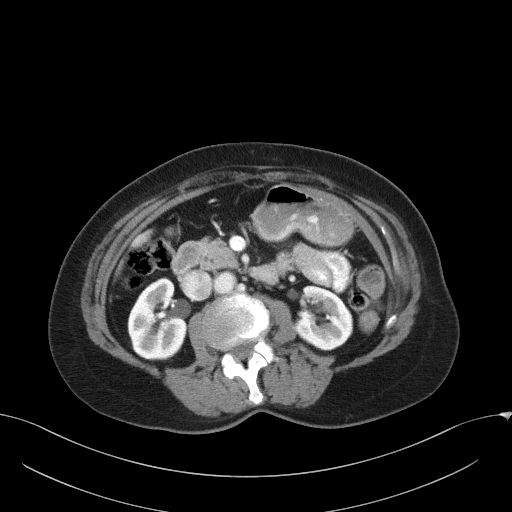
[im 64/94  bone]
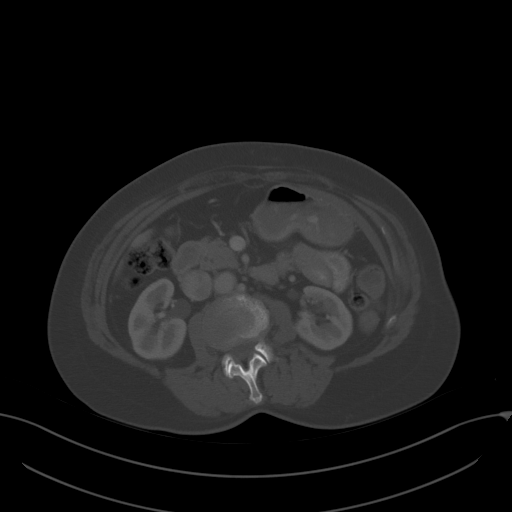
[im 70/94  soft-tissue]
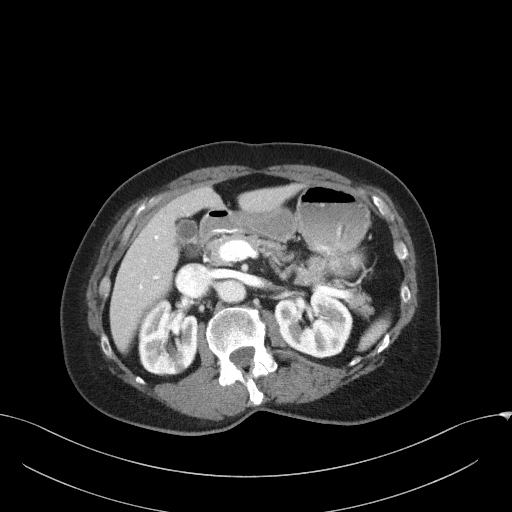
[im 82/94  soft-tissue]
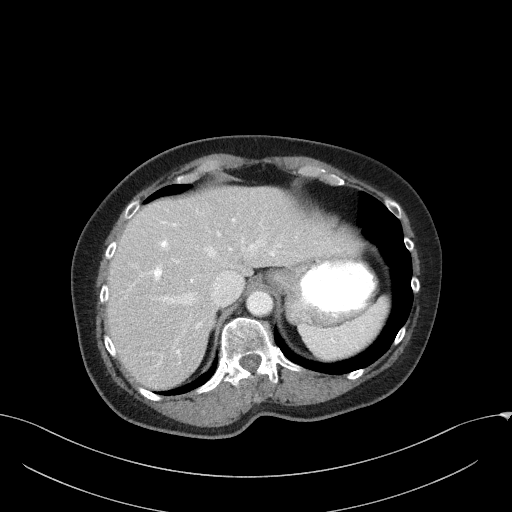
[im 88/94  soft-tissue]
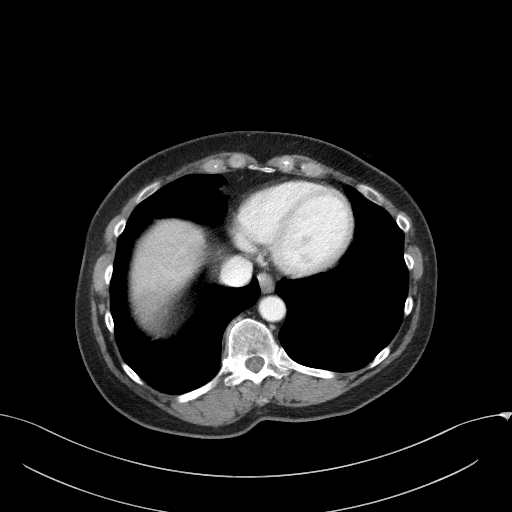

[Series 5: coronal st · coronal · 0.68mm/px · 3 of 88 slices shown]
[im 30/88  soft-tissue]
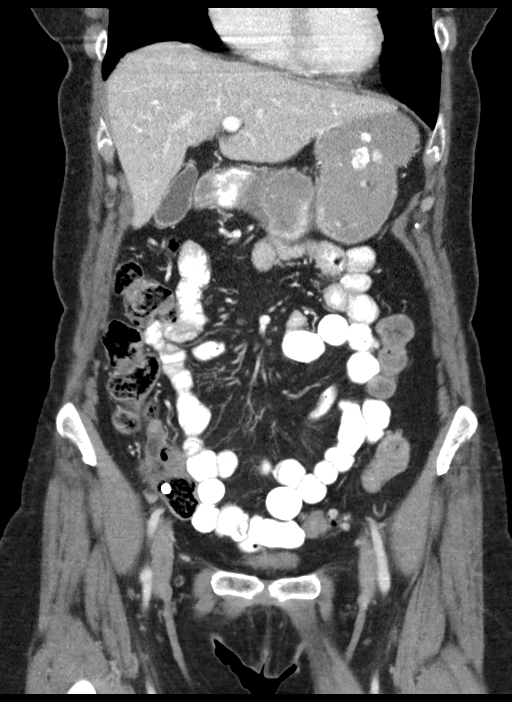
[im 39/88  soft-tissue]
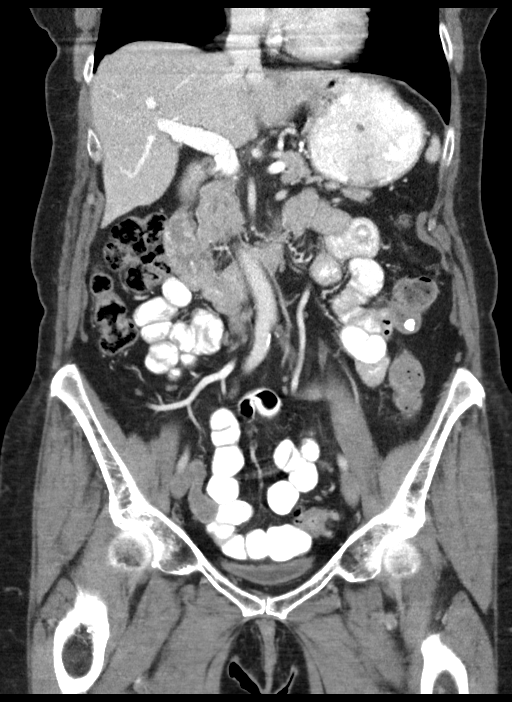
[im 49/88  soft-tissue]
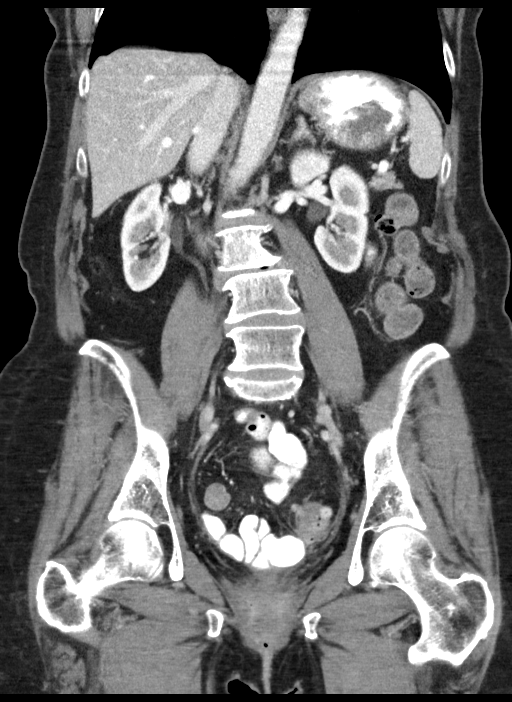

[15 of 46 positions shown; findings below may reference images not displayed]

FINDINGS: Lower chest: No acute abnormality. Normal size heart. No pericardial
effusion.

Hepatobiliary: A few subcentimeter hypodense hepatic lesions which
are unchanged from prior imaging and favored benign hepatic cysts.
No suspicious hepatic lesions. Cholelithiasis without evidence of
cholecystitis. No biliary ductal dilatation.

Pancreas: Unremarkable

Spleen: Unremarkable

Adrenals/Urinary Tract: Adrenal glands are unremarkable. No
hydronephrosis. No suspicious renal masses. No filling defect
visualized in the opacified portions of the collecting system and
ureter on delayed imaging. Urinary bladder is decompressed limiting
evaluation.

Stomach/Bowel: Stomach is distended with ingested contents without
suspicious wall thickening. Radiopaque enteric contrast visualized
to the level of the distal small bowel without evidence of abnormal
wall thickening. Extensive colonic diverticulosis with sigmoid
colonic wall thickening including a suspicious focus of masslike
wall thickening in the distal sigmoid colon on image 64/2. No
evidence of acute diverticulitis.

Vascular/Lymphatic: Aortic atherosclerosis. No enlarged abdominal or
pelvic lymph nodes.

Reproductive: Status post hysterectomy. No adnexal masses.

Other: No mesenteric/peritoneal nodularity visualized. No
abdominopelvic ascites.

Musculoskeletal: Mild multilevel degenerative changes spine. Similar
appearance of the mixed lytic and sclerotic lesion in the left iliac
bone on image 60/2 which was not previously hypermetabolic and
likely benign.
IMPRESSION: 1. Extensive colonic diverticulosis with long segment sigmoid
colonic wall thickening and a suspicious focal area of masslike wall
thickening in the distal sigmoid colon. Findings are nonspecific but
concerning for primary colonic neoplasm for which colonoscopy is
recommended for further evaluation.
2. Changes of prior hysterectomy without new soft tissue nodularity
along the vaginal cuff. No omental or mesenteric nodularity or
ascites. No abdominopelvic lymphadenopathy.
3. Cholelithiasis without evidence of cholecystitis.
4. Aortic atherosclerosis.  Aortic Atherosclerosis ([id number]).

These results will be called to the ordering clinician or
representative by the Radiologist Assistant, and communication
documented in the PACS or [REDACTED].

## 2021-12-13 DIAGNOSIS — T781XXD Other adverse food reactions, not elsewhere classified, subsequent encounter: Secondary | ICD-10-CM | POA: Diagnosis not present

## 2021-12-17 ENCOUNTER — Encounter: Payer: Self-pay | Admitting: Gynecologic Oncology

## 2021-12-28 ENCOUNTER — Ambulatory Visit: Payer: Medicare PPO | Admitting: Physical Medicine and Rehabilitation

## 2021-12-30 DIAGNOSIS — M25572 Pain in left ankle and joints of left foot: Secondary | ICD-10-CM | POA: Diagnosis not present

## 2022-01-12 DIAGNOSIS — T781XXD Other adverse food reactions, not elsewhere classified, subsequent encounter: Secondary | ICD-10-CM | POA: Diagnosis not present

## 2022-01-27 DIAGNOSIS — I7090 Unspecified atherosclerosis: Secondary | ICD-10-CM | POA: Diagnosis not present

## 2022-01-27 DIAGNOSIS — E039 Hypothyroidism, unspecified: Secondary | ICD-10-CM | POA: Diagnosis not present

## 2022-01-27 DIAGNOSIS — R5383 Other fatigue: Secondary | ICD-10-CM | POA: Diagnosis not present

## 2022-01-27 DIAGNOSIS — R7989 Other specified abnormal findings of blood chemistry: Secondary | ICD-10-CM | POA: Diagnosis not present

## 2022-01-27 DIAGNOSIS — E559 Vitamin D deficiency, unspecified: Secondary | ICD-10-CM | POA: Diagnosis not present

## 2022-02-09 DIAGNOSIS — E039 Hypothyroidism, unspecified: Secondary | ICD-10-CM | POA: Diagnosis not present

## 2022-02-22 ENCOUNTER — Encounter: Payer: Medicare PPO | Admitting: Physical Medicine and Rehabilitation

## 2022-02-27 DIAGNOSIS — R059 Cough, unspecified: Secondary | ICD-10-CM | POA: Diagnosis not present

## 2022-03-02 DIAGNOSIS — L568 Other specified acute skin changes due to ultraviolet radiation: Secondary | ICD-10-CM | POA: Diagnosis not present

## 2022-03-02 DIAGNOSIS — L910 Hypertrophic scar: Secondary | ICD-10-CM | POA: Diagnosis not present

## 2022-03-02 DIAGNOSIS — M25572 Pain in left ankle and joints of left foot: Secondary | ICD-10-CM | POA: Diagnosis not present

## 2022-03-09 DIAGNOSIS — L570 Actinic keratosis: Secondary | ICD-10-CM | POA: Diagnosis not present

## 2022-03-10 DIAGNOSIS — M25572 Pain in left ankle and joints of left foot: Secondary | ICD-10-CM | POA: Diagnosis not present

## 2022-03-23 DIAGNOSIS — L568 Other specified acute skin changes due to ultraviolet radiation: Secondary | ICD-10-CM | POA: Diagnosis not present

## 2022-03-23 DIAGNOSIS — I781 Nevus, non-neoplastic: Secondary | ICD-10-CM | POA: Diagnosis not present

## 2022-03-23 DIAGNOSIS — L905 Scar conditions and fibrosis of skin: Secondary | ICD-10-CM | POA: Diagnosis not present

## 2022-03-23 DIAGNOSIS — D485 Neoplasm of uncertain behavior of skin: Secondary | ICD-10-CM | POA: Diagnosis not present

## 2022-04-13 DIAGNOSIS — M19072 Primary osteoarthritis, left ankle and foot: Secondary | ICD-10-CM | POA: Diagnosis not present

## 2022-04-13 DIAGNOSIS — M25372 Other instability, left ankle: Secondary | ICD-10-CM | POA: Diagnosis not present

## 2022-04-13 DIAGNOSIS — G8918 Other acute postprocedural pain: Secondary | ICD-10-CM | POA: Diagnosis not present

## 2022-04-13 DIAGNOSIS — M25872 Other specified joint disorders, left ankle and foot: Secondary | ICD-10-CM | POA: Diagnosis not present

## 2022-04-13 DIAGNOSIS — M93272 Osteochondritis dissecans, left ankle and joints of left foot: Secondary | ICD-10-CM | POA: Diagnosis not present

## 2022-04-13 DIAGNOSIS — S99812A Other specified injuries of left ankle, initial encounter: Secondary | ICD-10-CM | POA: Diagnosis not present

## 2022-04-13 DIAGNOSIS — M948X7 Other specified disorders of cartilage, ankle and foot: Secondary | ICD-10-CM | POA: Diagnosis not present

## 2022-04-13 DIAGNOSIS — S93492A Sprain of other ligament of left ankle, initial encounter: Secondary | ICD-10-CM | POA: Diagnosis not present

## 2022-04-13 DIAGNOSIS — M958 Other specified acquired deformities of musculoskeletal system: Secondary | ICD-10-CM | POA: Diagnosis not present

## 2022-04-27 ENCOUNTER — Ambulatory Visit: Payer: Medicare PPO | Admitting: Gynecologic Oncology

## 2022-04-28 DIAGNOSIS — M25571 Pain in right ankle and joints of right foot: Secondary | ICD-10-CM | POA: Diagnosis not present

## 2022-04-28 DIAGNOSIS — M19072 Primary osteoarthritis, left ankle and foot: Secondary | ICD-10-CM | POA: Diagnosis not present

## 2022-05-06 DIAGNOSIS — K130 Diseases of lips: Secondary | ICD-10-CM | POA: Diagnosis not present

## 2022-05-20 ENCOUNTER — Ambulatory Visit: Payer: Medicare PPO | Admitting: Gynecologic Oncology

## 2022-05-26 DIAGNOSIS — M25571 Pain in right ankle and joints of right foot: Secondary | ICD-10-CM | POA: Diagnosis not present

## 2022-05-26 DIAGNOSIS — M19072 Primary osteoarthritis, left ankle and foot: Secondary | ICD-10-CM | POA: Diagnosis not present

## 2022-05-28 NOTE — Progress Notes (Unsigned)
Gynecologic Oncology Follow Up  [name] 72 y.o. 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 MSI stable. Treatment including surgery, chemotherapy, and radiation completed January, 2019. 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 Jaynie Collins. No evidence of recurrence on examination today.   Plan: 72 year old female s/p surgery on 11/23/2016 along with systemic therapy with 6 cycles of carboplatin and paclitaxel with vaginal brachytherapy to assist with local control with therapy completed in January, 2019.   Recommendation remains for 6 monthly evaluations with an exam every 6 months until 5 years after completion of treatment. Reportable signs and symptoms reviewed. She is advised to call for any needs or concerns or new symptoms.  HPI: [name] is a 72 year old female initially seen in consultation at the request of [name] regarding management of a newly diagnosed endometrial carcinoma. She developed postmenopausal bleeding in December 2017, which eventually stopped by February. More recently, the bleeding returned and was heavier. She sought care with [name], gynecologist. She was noted to have cervical stenosis on examination and therefore underwent a formal D&C 10/15/2016. Procedural findings included a large endometrial polyp that returned as a grade 2 endometrial carcinoma.  On 11/23/16, she underwent robotic assisted total hysterectomy, BSO, SLN biopsy with Dr. [name]. Operative findings included a 6 cm normal appearing uterus, surgically absent right ovary and bilateral fallopian tubes, adhesions between the left ovary and left ovarian fossa, and no suspicious nodes. Final pathology revealed a 6 cm grade 3 endometrioid tumor with 1.3cm of 1.9cm myo invasion, no LVSI. Negative adnexae, but 1of 2 SLN's was positive for macrometastatic disease (from  the left external iliac node).  On 12/09/2016, she had a PET scan to assess for signs of metastatic disease. The PET scan revealed no evidence of residual or metastatic carcinoma, a single left cervical lymph node that was nonspecific and considered reactive, sigmoid diverticulosis, and cholelithiasis.  In accordance with NCCN guidelines, she was counseled to receive adjuvant chemotherapy and vaginal brachytherapy which she received between October 2018 and January 2019 under the guidance of Dr. Sondra Come in Radiation Oncology and Dr. Heath Lark with Medical Oncology.  She developed severe neuropathy of the feet with Taxol. Prior to this, she had a history of neuropathy first developed in 2010 after treatment with antibiotics for a pelvic infection with chemotherapy in 2018 worsening her symptoms.  A CT AP was performed in February 2022 for evaluation of new deep pelvic/abdominal pain with no findings of metastatic or recurrent disease at that time, extensive colonic diverticulosis with long segment sigmoid colonic wall thickening and a suspicious focal area of mass-like wall thickening in the distal sigmoid colon. Based on the colon findings, she sought further evaluation with Dr. Collene Mares with gastroenterology. A diagnostic colonoscopy was performed on May 26, 2020 with no stricture or mass noted in the sigmoid colon as described on CT scan, moderate diverticulosis. A repeat colonscopy in 10 years for surveillance was recommended.  Interval Hx: Patient presents today for continued follow-up.  She states that she had ankle surgery in January and was shocked with the operative findings including torn tendons and broken bones.  She states she is improving since the surgery.  She has an MRI of the right foot today due to pain she is having.  She is unsure if this is from changing the way she ambulated after having the  left foot surgery.  She continues to follow a strict diet.  The diet is outlined in the book what  to consume by Dr. Zigmund Daniel narc C.  They are about 40 foods that she is able to eat.  She reports eating 1 week then and having immediate burning in her lower extremities and feet.  No abdominal pain reported.  No abdominal bloating, no early satiety, no nausea or vomiting.  Denies chest pain shortness of breath new cough.  No CVA tenderness reported.  The chiropractor she had seen for a long time recently passed away.  She is saddened by this.  She also states the NP she sees at [place], [employer], is retiring.  She continues to get her yearly mammograms with [facility].  No changes in her bladder habits.  Denies any abnormal bleeding from the bladder, vagina, rectum.  She reports no changes in her bowel habits.  She continues under the care of Dr. Consuela Mimes.  She had recent labs that showed improvement.   The patient has been going through challenges with her health since her last visit. She states starting in March of this year, she started experiencing mouth and feet burning when eating. She had pedal burning prior related to neuropathy and then foods would worsen this. She consulted with Dr. Jaynie Collins, who felt her blood was "filthy" related to past chemotherapy changes to the body, medication use, and not making enough enzymes. She noticed the burning with eating foods with oils as well and states she is able to use canola oil since it is the easiest to break down. She has started taking Lemdo under the guidance of Dr. Jaynie Collins. She has been taking 4 tablets a day with the goal to help build up her enzymes. She does this for 5 more days. She is hopeful about this treatment and states the pedal burning is improving. She has been following a diet in "What to Consume" stating there are 45 foods she can eat. She is adjusting to changes in meal prep/cooking.     In May, she was seeing her chiropractor and was using red light therapy, which seemed to worsen her symptoms. She started taking [place] in May for  2 months to help strengthen her adrenals since Dr. Jaynie Collins felt these were low. In April, she saw her orthopedic provider with left ankle swelling. She eventually received an injection in the ankle. She has played golf but states she was unable to walk after. She has stopped taking meloxicam, PPI, and muscle relaxants since this was felt to worsen her symptoms.  No unintentional weight loss or gain.  No abdominal pain reported. No vaginal bleeding or discharge reported.  Bowels are functioning without difficulty with no rectal bleeding reported. No reported symptoms concerning for recurrence.   Review of Systems: Constitutional: Feels she is improving. No fever, chills, early satiety, unintentional change in weight, change in appetite.  Cardiovascular: No chest pain, shortness of breath. Mild edema in left lower extremity due to recent injury.  Pulmonary: No new cough or wheeze.  Gastrointestinal: No nausea, vomiting, or diarrhea. No bright red blood per rectum or change in bowel movement.  Genitourinary: Post-radiation bladder symptoms appear to be improved with Cos supplement. No new frequency, urgency, or dysuria. No vaginal bleeding or discharge.  Musculoskeletal: Left lower leg discomfort, decreased mobility is improving.  Neurologic: No new weakness. + for neuropathy.  Psychology: No new symptoms of depression, anxiety. Additional review of systems is negative. See interval  history.  Health Maintenance: Mammogram: She has these at [facility]. Last March 2023  Colonoscopy: 05/26/2020 with Dr. Collene Mares  Vitals: There were no vitals taken for this visit.  Physical Exam: General: Well developed, well nourished female in no acute distress. Alert and oriented x 3.  Neck: Supple without any enlargements.  Lymph node survey: No cervical, supraclavicular, or inguinal adenopathy.  Cardiovascular: Regular rate and rhythm. S1 and S2 normal.  Lungs: Clear to auscultation bilaterally. No  wheezes/crackles/rhonchi noted.  Skin: No rashes or lesions present. Back: No CVA tenderness.  Abdomen: Abdomen soft, non-tender and non-obese. Active bowel sounds in all quadrants. No evidence of a fluid wave or abdominal masses. Incisions well healed without hernias or nodularity.  Genitourinary:    Vulva/vagina: Normal external female genitalia. No lesions.    Urethra: No lesions or masses.    Vagina: Atrophic without any lesions. Radiation changes noted at the well healed vaginal cuff. No palpable masses. No vaginal bleeding or drainage noted.  Rectal: Good tone, no masses, no cul de sac nodularity.  Extremities: No bilateral cyanosis or clubbing. Trace edema in LLE around ankle from recent injury.   No Known Allergies  Past Medical History:  Diagnosis Date   Cancer ([place]) 1998   thyroid ca   Degenerative arthritis of toe joint, right    great toe   Endometrial cancer ([place])    Endometrial mass 10/15/2016   History of radiation therapy 01/18/17-02/23/17   vaginal cuff 30 Gy in 5 fractions   Hypothyroidism    Neuromuscular disorder ([facility])    neuropathy   Neuropathy    feet   Postmenopausal bleeding 10/15/2016    Past Surgical History:  Procedure Laterality Date   ABDOMINAL HYSTERECTOMY     ABDOMINOPLASTY     APPENDECTOMY     BUNIONECTOMY WITH CHILECTOMY Right 03/03/2016   Procedure: Lillard Anes WITH CHILECTOMY;  Surgeon: [name], MD;  Location: [place];  Service: Orthopedics;  Laterality: Right;   CHEILECTOMY Right 03/03/2016   Procedure: CHEILECTOMY RIGHT GREAT TOE;  Surgeon: [name], MD;  Location: [place];  Service: Orthopedics;  Laterality: Right;   COLONOSCOPY     COSMETIC SURGERY     DILATATION & CURETTAGE/HYSTEROSCOPY WITH MYOSURE N/A 10/15/2016   Procedure: DILATATION & CURETTAGE/HYSTEROSCOPY WITH MYOSURE;  Surgeon: [name], MD;  Location: [facility];  Service: Gynecology;  Laterality: N/A;  Requests Microdilators and  Lacrimal Duct Dilators   IR FLUORO GUIDE PORT INSERTION RIGHT  12/23/2016   IR REMOVAL TUN ACCESS W/ PORT W/O FL MOD SED  05/20/2017   IR US GUIDE VASC ACCESS RIGHT  12/23/2016   OVARIAN CYST REMOVAL  1976   ROBOTIC ASSISTED BILATERAL SALPINGO OOPHERECTOMY Bilateral 11/23/2016   Procedure: XI ROBOTIC ASSISTED LEFT OOPHORECTOMY;  Surgeon: [name], MD;  Location: [facility];  Service: Gynecology;  Laterality: Bilateral;   ROBOTIC ASSISTED TOTAL HYSTERECTOMY Bilateral 11/23/2016   Procedure: XI ROBOTIC ASSISTED TOTAL HYSTERECTOMY;  Surgeon: [name], MD;  Location: [facility];  Service: Gynecology;  Laterality: Bilateral;   SENTINEL NODE BIOPSY Bilateral 11/23/2016   Procedure: SENTINEL NODE BIOPSY;  Surgeon: [name], MD;  Location: [facility];  Service: Gynecology;  Laterality: Bilateral;   SHOULDER SURGERY Left 03/2015   by dr [name] at [place]  04/1996    Current Outpatient Medications  Medication Sig Dispense Refill   levothyroxine (SYNTHROID) 137 MCG tablet Take by mouth.     OVER THE COUNTER MEDICATION Take  5 tablets by mouth 3 (three) times a week. Pyridoxal 1, 2, 3, and 4 otc supplement  Vitamin package     OVER THE COUNTER MEDICATION Take 1 tablet by mouth as directed. Sulfur 24 g7 otc supplement take 1 tablet daily 5 days out of the week  Adrenaline gland support     OVER THE COUNTER MEDICATION Take 1 capsule by mouth See admin instructions. Carbon 90 bx otc supplement take 1 capsule daily 5 days out of the week  Omega 3 supplement     OVER THE COUNTER MEDICATION CALM '300mg'$  BID     UNABLE TO FIND Med Name: nervive - pt takes for neuropathy     No current facility-administered medications for this visit.    Social History   Socioeconomic History   Marital status: Married    Spouse name: Joe   Number of children: 0   Years of education: 20   Highest education level: Not on file  Occupational History   Occupation: retired  Tobacco Use   Smoking status: Never   Smokeless  tobacco: Never  Vaping Use   Vaping Use: Never used  Substance and Sexual Activity   Alcohol use: Yes    Comment: social, 2 glasses of wine per week   Drug use: No   Sexual activity: Not on file  Other Topics Concern   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  No children.   Highest level of education:  PhD in leadership studies, MBA   Social Determinants of Health   Financial Resource Strain: Not on file  Food Insecurity: Not on file  Transportation Needs: Not on file  Physical Activity: Not on file  Stress: Not on file  Social Connections: Not on file  Intimate Partner Violence: Not on file    Family History  Problem Relation Age of Onset   Neurologic Disorder Mother        Trigeminal neuralgia   Alzheimer's disease Mother    Diabetes Father    Breast cancer Sister     Dorothyann Gibbs, MD 05/28/2022, 2:19 PM

## 2022-06-01 ENCOUNTER — Other Ambulatory Visit: Payer: Self-pay

## 2022-06-01 ENCOUNTER — Inpatient Hospital Stay: Payer: Medicare PPO | Attending: Gynecologic Oncology | Admitting: Gynecologic Oncology

## 2022-06-01 VITALS — BP 98/52 | HR 79 | Temp 98.4°F | Resp 16 | Ht 68.0 in | Wt 140.0 lb

## 2022-06-01 DIAGNOSIS — Z90722 Acquired absence of ovaries, bilateral: Secondary | ICD-10-CM | POA: Diagnosis not present

## 2022-06-01 DIAGNOSIS — Z9221 Personal history of antineoplastic chemotherapy: Secondary | ICD-10-CM | POA: Insufficient documentation

## 2022-06-01 DIAGNOSIS — Z90710 Acquired absence of both cervix and uterus: Secondary | ICD-10-CM | POA: Insufficient documentation

## 2022-06-01 DIAGNOSIS — C541 Malignant neoplasm of endometrium: Secondary | ICD-10-CM

## 2022-06-01 DIAGNOSIS — Z923 Personal history of irradiation: Secondary | ICD-10-CM | POA: Insufficient documentation

## 2022-06-01 DIAGNOSIS — Z8542 Personal history of malignant neoplasm of other parts of uterus: Secondary | ICD-10-CM | POA: Diagnosis not present

## 2022-06-01 DIAGNOSIS — M25571 Pain in right ankle and joints of right foot: Secondary | ICD-10-CM | POA: Diagnosis not present

## 2022-06-01 NOTE — Patient Instructions (Addendum)
Congratulations on your 5 year anniversary from completion of treatment!   Your exam was normal today. Plan to follow up in one year in our cancer survivorship program with [facility] NP. Plan to call the office in Jan or Feb 2025 to arrange for an appointment in March 2025. Please call for any needs, concerns, or new symptoms.    I hope your healing process continues to go well with your ankle. Please let me know if you have any questions or needs.

## 2022-07-12 DIAGNOSIS — E039 Hypothyroidism, unspecified: Secondary | ICD-10-CM | POA: Diagnosis not present

## 2022-07-27 DIAGNOSIS — E7210 Disorders of sulfur-bearing amino-acid metabolism, unspecified: Secondary | ICD-10-CM | POA: Diagnosis not present

## 2022-07-27 DIAGNOSIS — E559 Vitamin D deficiency, unspecified: Secondary | ICD-10-CM | POA: Diagnosis not present

## 2022-07-27 DIAGNOSIS — I7090 Unspecified atherosclerosis: Secondary | ICD-10-CM | POA: Diagnosis not present

## 2022-07-27 DIAGNOSIS — R7989 Other specified abnormal findings of blood chemistry: Secondary | ICD-10-CM | POA: Diagnosis not present

## 2022-07-27 DIAGNOSIS — R5383 Other fatigue: Secondary | ICD-10-CM | POA: Diagnosis not present

## 2022-07-27 DIAGNOSIS — E039 Hypothyroidism, unspecified: Secondary | ICD-10-CM | POA: Diagnosis not present

## 2022-08-04 DIAGNOSIS — E039 Hypothyroidism, unspecified: Secondary | ICD-10-CM | POA: Diagnosis not present

## 2022-08-12 DIAGNOSIS — Z1231 Encounter for screening mammogram for malignant neoplasm of breast: Secondary | ICD-10-CM | POA: Diagnosis not present

## 2022-08-23 DIAGNOSIS — M25571 Pain in right ankle and joints of right foot: Secondary | ICD-10-CM | POA: Diagnosis not present

## 2022-08-23 DIAGNOSIS — M19072 Primary osteoarthritis, left ankle and foot: Secondary | ICD-10-CM | POA: Diagnosis not present

## 2022-09-30 DIAGNOSIS — H2513 Age-related nuclear cataract, bilateral: Secondary | ICD-10-CM | POA: Diagnosis not present

## 2022-09-30 DIAGNOSIS — H5213 Myopia, bilateral: Secondary | ICD-10-CM | POA: Diagnosis not present

## 2023-01-10 DIAGNOSIS — E039 Hypothyroidism, unspecified: Secondary | ICD-10-CM | POA: Diagnosis not present

## 2023-01-28 DIAGNOSIS — M19072 Primary osteoarthritis, left ankle and foot: Secondary | ICD-10-CM | POA: Diagnosis not present

## 2023-02-15 DIAGNOSIS — Z961 Presence of intraocular lens: Secondary | ICD-10-CM | POA: Diagnosis not present

## 2023-02-15 DIAGNOSIS — H25012 Cortical age-related cataract, left eye: Secondary | ICD-10-CM | POA: Diagnosis not present

## 2023-02-15 DIAGNOSIS — H2512 Age-related nuclear cataract, left eye: Secondary | ICD-10-CM | POA: Diagnosis not present

## 2023-02-15 DIAGNOSIS — H25812 Combined forms of age-related cataract, left eye: Secondary | ICD-10-CM | POA: Diagnosis not present

## 2023-02-16 DIAGNOSIS — R7989 Other specified abnormal findings of blood chemistry: Secondary | ICD-10-CM | POA: Diagnosis not present

## 2023-02-16 DIAGNOSIS — E559 Vitamin D deficiency, unspecified: Secondary | ICD-10-CM | POA: Diagnosis not present

## 2023-02-16 DIAGNOSIS — K9041 Non-celiac gluten sensitivity: Secondary | ICD-10-CM | POA: Diagnosis not present

## 2023-02-16 DIAGNOSIS — E039 Hypothyroidism, unspecified: Secondary | ICD-10-CM | POA: Diagnosis not present

## 2023-02-16 DIAGNOSIS — R5383 Other fatigue: Secondary | ICD-10-CM | POA: Diagnosis not present

## 2023-02-23 DIAGNOSIS — E039 Hypothyroidism, unspecified: Secondary | ICD-10-CM | POA: Diagnosis not present

## 2023-02-27 DIAGNOSIS — R202 Paresthesia of skin: Secondary | ICD-10-CM | POA: Diagnosis not present

## 2023-03-01 DIAGNOSIS — H2511 Age-related nuclear cataract, right eye: Secondary | ICD-10-CM | POA: Diagnosis not present

## 2023-03-01 DIAGNOSIS — H25811 Combined forms of age-related cataract, right eye: Secondary | ICD-10-CM | POA: Diagnosis not present

## 2023-03-01 DIAGNOSIS — H25011 Cortical age-related cataract, right eye: Secondary | ICD-10-CM | POA: Diagnosis not present

## 2023-03-01 DIAGNOSIS — Z961 Presence of intraocular lens: Secondary | ICD-10-CM | POA: Diagnosis not present

## 2023-03-14 DIAGNOSIS — R202 Paresthesia of skin: Secondary | ICD-10-CM | POA: Diagnosis not present

## 2023-03-21 DIAGNOSIS — Z20822 Contact with and (suspected) exposure to covid-19: Secondary | ICD-10-CM | POA: Diagnosis not present

## 2023-03-21 DIAGNOSIS — J029 Acute pharyngitis, unspecified: Secondary | ICD-10-CM | POA: Diagnosis not present

## 2023-03-21 DIAGNOSIS — R519 Headache, unspecified: Secondary | ICD-10-CM | POA: Diagnosis not present

## 2023-03-21 DIAGNOSIS — R202 Paresthesia of skin: Secondary | ICD-10-CM | POA: Diagnosis not present

## 2023-03-31 DIAGNOSIS — M958 Other specified acquired deformities of musculoskeletal system: Secondary | ICD-10-CM | POA: Diagnosis not present

## 2023-03-31 DIAGNOSIS — M19071 Primary osteoarthritis, right ankle and foot: Secondary | ICD-10-CM | POA: Diagnosis not present

## 2023-03-31 DIAGNOSIS — T8484XA Pain due to internal orthopedic prosthetic devices, implants and grafts, initial encounter: Secondary | ICD-10-CM | POA: Diagnosis not present

## 2023-03-31 DIAGNOSIS — M25871 Other specified joint disorders, right ankle and foot: Secondary | ICD-10-CM | POA: Diagnosis not present

## 2023-03-31 DIAGNOSIS — M898X7 Other specified disorders of bone, ankle and foot: Secondary | ICD-10-CM | POA: Diagnosis not present

## 2023-03-31 DIAGNOSIS — G8918 Other acute postprocedural pain: Secondary | ICD-10-CM | POA: Diagnosis not present

## 2023-03-31 DIAGNOSIS — M25371 Other instability, right ankle: Secondary | ICD-10-CM | POA: Diagnosis not present

## 2023-03-31 DIAGNOSIS — M7671 Peroneal tendinitis, right leg: Secondary | ICD-10-CM | POA: Diagnosis not present

## 2023-03-31 DIAGNOSIS — M7672 Peroneal tendinitis, left leg: Secondary | ICD-10-CM | POA: Diagnosis not present

## 2023-03-31 DIAGNOSIS — M948X7 Other specified disorders of cartilage, ankle and foot: Secondary | ICD-10-CM | POA: Diagnosis not present

## 2023-03-31 DIAGNOSIS — S86391A Other injury of muscle(s) and tendon(s) of peroneal muscle group at lower leg level, right leg, initial encounter: Secondary | ICD-10-CM | POA: Diagnosis not present

## 2023-04-15 DIAGNOSIS — M1712 Unilateral primary osteoarthritis, left knee: Secondary | ICD-10-CM | POA: Diagnosis not present

## 2023-04-15 DIAGNOSIS — M19071 Primary osteoarthritis, right ankle and foot: Secondary | ICD-10-CM | POA: Diagnosis not present

## 2023-04-20 DIAGNOSIS — E039 Hypothyroidism, unspecified: Secondary | ICD-10-CM | POA: Diagnosis not present

## 2023-04-29 DIAGNOSIS — E039 Hypothyroidism, unspecified: Secondary | ICD-10-CM | POA: Diagnosis not present

## 2023-05-18 DIAGNOSIS — M19072 Primary osteoarthritis, left ankle and foot: Secondary | ICD-10-CM | POA: Diagnosis not present

## 2023-05-18 DIAGNOSIS — M19071 Primary osteoarthritis, right ankle and foot: Secondary | ICD-10-CM | POA: Diagnosis not present

## 2023-05-26 DIAGNOSIS — M25572 Pain in left ankle and joints of left foot: Secondary | ICD-10-CM | POA: Diagnosis not present

## 2023-06-01 DIAGNOSIS — M25572 Pain in left ankle and joints of left foot: Secondary | ICD-10-CM | POA: Diagnosis not present

## 2023-06-01 DIAGNOSIS — M25571 Pain in right ankle and joints of right foot: Secondary | ICD-10-CM | POA: Diagnosis not present

## 2023-06-06 DIAGNOSIS — J029 Acute pharyngitis, unspecified: Secondary | ICD-10-CM | POA: Diagnosis not present

## 2023-06-28 DIAGNOSIS — G8918 Other acute postprocedural pain: Secondary | ICD-10-CM | POA: Diagnosis not present

## 2023-06-28 DIAGNOSIS — S86312A Strain of muscle(s) and tendon(s) of peroneal muscle group at lower leg level, left leg, initial encounter: Secondary | ICD-10-CM | POA: Diagnosis not present

## 2023-06-28 DIAGNOSIS — M25872 Other specified joint disorders, left ankle and foot: Secondary | ICD-10-CM | POA: Diagnosis not present

## 2023-06-28 DIAGNOSIS — M24672 Ankylosis, left ankle: Secondary | ICD-10-CM | POA: Diagnosis not present

## 2023-06-28 DIAGNOSIS — M67874 Other specified disorders of tendon, left ankle and foot: Secondary | ICD-10-CM | POA: Diagnosis not present

## 2023-07-16 DIAGNOSIS — E039 Hypothyroidism, unspecified: Secondary | ICD-10-CM | POA: Diagnosis not present

## 2023-08-10 DIAGNOSIS — M25571 Pain in right ankle and joints of right foot: Secondary | ICD-10-CM | POA: Diagnosis not present

## 2023-08-10 DIAGNOSIS — M7672 Peroneal tendinitis, left leg: Secondary | ICD-10-CM | POA: Diagnosis not present

## 2023-08-24 ENCOUNTER — Telehealth: Payer: Self-pay

## 2023-08-24 NOTE — Telephone Encounter (Signed)
 [name] called stating she would like to schedule a follow up appointment with Vira Grieves NP.   Pt is scheduled for 6/17 @ 2:00. Pt agreed to date/time. She reports having a mammogram scheduled for 6/11 and wants to make sure the results are back so Vira Grieves can review them at her appointment

## 2023-09-05 NOTE — Progress Notes (Signed)
 Gynecologic Oncology Follow Up  [name] [name] 73 y.o. 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 MSI stable. Treatment including surgery, chemotherapy (6 cycles of carboplatin  and paclitaxel ) and radiation completed January, 2019. No evidence of recurrence on examination today. 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 Had relief with use of oxycodone  from ankle surgery taken at bedtime. Continues to see Dr. Edna virtually and following diet per Dr. Latrelle. We discussed her request for oxycodone  and the risk for dependence when taken long term. After discussion, we will plan for a short course of tramadol  to see if this helps with her symptoms at bedtime. She will keep us  updated on how she is tolerating the medication and her symptoms.  She is advised to follow up in one year or sooner if needed. She will continue to have mammograms with [facility]. Reportable signs and symptoms reviewed. She is advised to call for any needs or concerns or new symptoms before her appointment in June 2026.  HPI: [name] is a 73 year old female initially seen in consultation at the request of Dr. Barbette regarding management of a newly diagnosed endometrial carcinoma. She developed postmenopausal bleeding in December 2017, which eventually stopped by February. More recently, the bleeding returned and was heavier. She sought care with Dr. Barbette, gynecologist. She was noted to have cervical stenosis on examination and therefore underwent a formal D&C 10/15/2016. Procedural findings included a large endometrial polyp that returned as a grade 2 endometrial carcinoma.  On 11/23/16, she underwent robotic assisted total hysterectomy, BSO, SLN biopsy with Dr. Maurilio Ship. Operative findings included a 6 cm normal appearing uterus, surgically absent right ovary and bilateral fallopian tubes, adhesions  between the left ovary and left ovarian fossa, and no suspicious nodes. Final pathology revealed a 6 cm grade 3 endometrioid tumor with 1.3cm of 1.9cm myo invasion, no LVSI. Negative adnexae, but 1of 2 SLN's was positive for macrometastatic disease (from the left external iliac node).  On 12/09/2016, she had a PET scan to assess for signs of metastatic disease. The PET scan revealed no evidence of residual or metastatic carcinoma, a single left cervical lymph node that was nonspecific and considered reactive, sigmoid diverticulosis, and cholelithiasis.  In accordance with NCCN guidelines, she was counseled to receive adjuvant chemotherapy and vaginal brachytherapy which she received between October 2018 and January 2019 under the guidance of Dr. Shannon in Radiation Oncology and Dr. Almarie Bedford with Medical Oncology.  She developed severe neuropathy of the feet with Taxol . Prior to this, she had a history of neuropathy first developed in 2010 after treatment with antibiotics for a pelvic infection with chemotherapy in 2018 worsening her symptoms.  A CT AP was performed in February 2022 for evaluation of new deep pelvic/abdominal pain with no findings of metastatic or recurrent disease at that time, extensive colonic diverticulosis with long segment sigmoid colonic wall thickening and a suspicious focal area of mass-like wall thickening in the distal sigmoid colon. Based on the colon findings, she sought further evaluation with Dr. Kristie with gastroenterology. A diagnostic colonoscopy was performed on May 26, 2020 with no stricture or mass noted in the sigmoid colon as described on CT scan, moderate diverticulosis. A repeat colonscopy in 10 years for surveillance was recommended.  Interval Hx: Patient presents today for continued follow-up. She reports overall doing well. She is tolerating her diet (diet in What to  Consume by Dr. Latrelle) with no nausea or emesis. She has lost ten pounds and is at her current  weight from high school. No abdominal/pelvic pain, bloating, or early satiety. Bladder and bowels functioning without difficulty. No vaginal bleeding or discharge. No significant lower extremity edema, mild edema from recent surgeries. Was given oxycodone  for prn use from ankle surgery. She has taking this at bedtime or during the night when the pain makes it difficult to sleep. She has found this to help her neuropathy symptoms. She continues to have severe neuropathy symptoms. She has tried gabapentin , mobic , celebrex in the past with no relief and worsening of symptoms. She has added ALA to improve her circulation and help with neuropathy symptoms.   She has had two ankle surgeries this year with the right on March 31, 2023 and the left on June 26, 2023. The tendons in her feet were found to be torn/tattered with findings not picked up on MRI. She has been increasing her activity slowly and has found more discomfort and swelling when more active. She has been applying ice to her ankles and has been having to get up throughout the night due to discomfort. She had cataract surgery on 02/15/2023 and 03/01/2023. She is planning on having her mammogram soon.  Review of Systems: Constitutional: Feels she is improving slowly since recent ankle surgery. No fever, chills, early satiety, change in appetite. Change in weight related to dietary choices. Cardiovascular: No chest pain, shortness of breath. Mild edema in left lower extremity due to recent surgery  Pulmonary: No new cough or wheeze.  Gastrointestinal: No nausea, vomiting, or diarrhea. No bright red blood per rectum or change in bowel movement.  Genitourinary: No new frequency, urgency, or dysuria. No vaginal bleeding or discharge.  Musculoskeletal: Recent bilateral ankle surgeries  Neurologic: No new weakness. + for neuropathy.  Psychology: No new symptoms of depression, anxiety. Additional review of systems is negative. See interval  history.  Health Maintenance: Mammogram: She has these at [facility]. To have soon.  Colonoscopy: 05/26/2020 with Dr. Kristie  Vitals: Blood pressure (!) 99/47, pulse 82, temperature 98.3 F (36.8 C), temperature source Oral, resp. rate 14, weight 128 lb 3.2 oz (58.2 kg), SpO2 100%.  Physical Exam: General: Well developed, well nourished female in no acute distress. Alert and oriented x 3.  Neck: Supple without any enlargements.  Lymph node survey: No cervical, supraclavicular, or inguinal adenopathy.  Cardiovascular: Regular rate and rhythm. S1 and S2 normal.  Lungs: Clear to auscultation bilaterally. No wheezes/crackles/rhonchi noted.  Skin: No rashes or lesions present. Back: No CVA tenderness.  Abdomen: Abdomen soft, non-tender and non-obese. Active bowel sounds in all quadrants. No evidence of a fluid wave or abdominal masses. Incisions well healed without hernias or nodularity.  Genitourinary:    Vulva/vagina: Normal external female genitalia. No lesions.    Urethra: No lesions or masses.    Vagina: Atrophic without any lesions. Radiation changes noted at the well healed vaginal cuff. No palpable masses. No vaginal bleeding or drainage noted.  Rectal: Good tone, no masses, no cul de sac nodularity.  Extremities: No bilateral cyanosis or clubbing. Bilateral ankle incisions healing without erythema or drainage.  No Known Allergies  Past Medical History:  Diagnosis Date   Cancer (HCC) 1998   thyroid  ca   Degenerative arthritis of toe joint, right    great toe   Endometrial cancer (HCC)    Endometrial mass 10/15/2016   History of radiation therapy 01/18/17-02/23/17  vaginal cuff 30 Gy in 5 fractions   Hypothyroidism    Neuromuscular disorder (HCC)    neuropathy   Neuropathy    feet   Postmenopausal bleeding 10/15/2016    Past Surgical History:  Procedure Laterality Date   ABDOMINAL HYSTERECTOMY     ABDOMINOPLASTY     APPENDECTOMY     BUNIONECTOMY WITH CHILECTOMY  Right 03/03/2016   Procedure: ROMAYNE WITH CHILECTOMY;  Surgeon: Dempsey Sensor, MD;  Location: [facility];  Service: Orthopedics;  Laterality: Right;   CHEILECTOMY Right 03/03/2016   Procedure: CHEILECTOMY RIGHT GREAT TOE;  Surgeon: Dempsey Sensor, MD;  Location: [facility];  Service: Orthopedics;  Laterality: Right;   COLONOSCOPY     COSMETIC SURGERY     DILATATION & CURETTAGE/HYSTEROSCOPY WITH MYOSURE N/A 10/15/2016   Procedure: DILATATION & CURETTAGE/HYSTEROSCOPY WITH MYOSURE;  Surgeon: Barbette Knock, MD;  Location: [facility];  Service: Gynecology;  Laterality: N/A;  Requests Microdilators and Lacrimal Duct Dilators   IR FLUORO GUIDE PORT INSERTION RIGHT  12/23/2016   IR REMOVAL TUN ACCESS W/ PORT W/O FL MOD SED  05/20/2017   IR US  GUIDE VASC ACCESS RIGHT  12/23/2016   OVARIAN CYST REMOVAL  1976   ROBOTIC ASSISTED BILATERAL SALPINGO OOPHERECTOMY Bilateral 11/23/2016   Procedure: XI ROBOTIC ASSISTED LEFT OOPHORECTOMY;  Surgeon: Eloy Herring, MD;  Location: [facility];  Service: Gynecology;  Laterality: Bilateral;   ROBOTIC ASSISTED TOTAL HYSTERECTOMY Bilateral 11/23/2016   Procedure: XI ROBOTIC ASSISTED TOTAL HYSTERECTOMY;  Surgeon: Eloy Herring, MD;  Location: [facility];  Service: Gynecology;  Laterality: Bilateral;   SENTINEL NODE BIOPSY Bilateral 11/23/2016   Procedure: SENTINEL NODE BIOPSY;  Surgeon: Eloy Herring, MD;  Location: [facility];  Service: Gynecology;  Laterality: Bilateral;   SHOULDER SURGERY Left 03/2015   by dr sensor at [facility]   THYROIDECTOMY  04/1996    Current Outpatient Medications  Medication Sig Dispense Refill   cyclobenzaprine (FLEXERIL) 10 MG tablet SMARTSIG:0.5-1 Tablet(s) By Mouth 2-3 Times Daily PRN     levothyroxine  (SYNTHROID ) 137 MCG tablet Take by mouth.     OVER THE COUNTER MEDICATION Take 5 tablets by mouth 3 (three) times a week. Pyridoxal 1, 2, 3, and 4 otc supplement  Vitamin package     OVER THE COUNTER MEDICATION Take 1 tablet by mouth as  directed. Sulfur 24 g7 otc supplement take 1 tablet daily 5 days out of the week  Adrenaline gland support     OVER THE COUNTER MEDICATION Take 1 capsule by mouth See admin instructions. Carbon 90 bx otc supplement take 1 capsule daily 5 days out of the week  Omega 3 supplement     traMADol  (ULTRAM ) 50 MG tablet Take 1 tablet (50 mg total) by mouth at bedtime as needed for severe pain (pain score 7-10). Do not take and drive. Do not take with other narcotics. 30 tablet 0   No current facility-administered medications for this visit.    Social History   Socioeconomic History   Marital status: Married    Spouse name: Joe   Number of children: 0   Years of education: 20   Highest education level: Not on file  Occupational History   Occupation: retired  Tobacco Use   Smoking status: Never   Smokeless tobacco: Never  Vaping Use   Vaping status: Never Used  Substance and Sexual Activity   Alcohol use: Yes    Comment: social, 2 glasses of wine per week   Drug use: No  Sexual activity: Not on file  Other Topics Concern   Not on file  Social History Narrative   She retired from career Educational psychologist in 2015.   She lives with husband.  No children.   Highest level of education:  PhD in leadership studies, MBA   Social Drivers of Health   Financial Resource Strain: Not on file  Food Insecurity: Not on file  Transportation Needs: Not on file  Physical Activity: Not on file  Stress: Not on file  Social Connections: Not on file  Intimate Partner Violence: Not on file    Family History  Problem Relation Age of Onset   Neurologic Disorder Mother        Trigeminal neuralgia   Alzheimer's disease Mother    Diabetes Father    Breast cancer Sister     Eleanor JONETTA Epps, NP 09/07/2023, 8:36 AM

## 2023-09-06 ENCOUNTER — Inpatient Hospital Stay: Attending: Gynecologic Oncology | Admitting: Gynecologic Oncology

## 2023-09-06 VITALS — BP 99/47 | HR 82 | Temp 98.3°F | Resp 14 | Wt 128.2 lb

## 2023-09-06 DIAGNOSIS — T451X5A Adverse effect of antineoplastic and immunosuppressive drugs, initial encounter: Secondary | ICD-10-CM

## 2023-09-06 DIAGNOSIS — Z90710 Acquired absence of both cervix and uterus: Secondary | ICD-10-CM | POA: Diagnosis not present

## 2023-09-06 DIAGNOSIS — Z9079 Acquired absence of other genital organ(s): Secondary | ICD-10-CM | POA: Insufficient documentation

## 2023-09-06 DIAGNOSIS — C541 Malignant neoplasm of endometrium: Secondary | ICD-10-CM

## 2023-09-06 DIAGNOSIS — G620 Drug-induced polyneuropathy: Secondary | ICD-10-CM | POA: Diagnosis not present

## 2023-09-06 DIAGNOSIS — Z90722 Acquired absence of ovaries, bilateral: Secondary | ICD-10-CM | POA: Diagnosis not present

## 2023-09-06 DIAGNOSIS — Z923 Personal history of irradiation: Secondary | ICD-10-CM | POA: Insufficient documentation

## 2023-09-06 DIAGNOSIS — Z9221 Personal history of antineoplastic chemotherapy: Secondary | ICD-10-CM | POA: Diagnosis not present

## 2023-09-06 DIAGNOSIS — Z8542 Personal history of malignant neoplasm of other parts of uterus: Secondary | ICD-10-CM | POA: Diagnosis not present

## 2023-09-06 MED ORDER — TRAMADOL HCL 50 MG PO TABS: 50.0000 mg | ORAL_TABLET | Freq: Every evening | ORAL | 0 refills | Status: DC | PRN

## 2023-09-06 NOTE — Patient Instructions (Addendum)
 It was good seeing you today. Your exam was normal today with no concerning findings.  I have prescribed a short course of tramadol to see if this helps with your neuropathy symptoms. You can use this at bedtime as needed. Please keep us  posted on how this is working. If this causes constipation, you can use the laxative of choice.  Plan to follow up in one year or sooner if needed. Please call for any needs, concerns, or new symptoms.   Symptoms to report to your health care team include vaginal bleeding, rectal bleeding, bloating, weight loss without effort, new and persistent pain, new and  persistent fatigue, new leg swelling, new masses (i.e., bumps in your neck or groin), new and persistent cough, new and persistent nausea and vomiting, change in bowel or bladder habits, and any other concerns.

## 2023-09-08 ENCOUNTER — Encounter: Payer: Self-pay | Admitting: Gynecologic Oncology

## 2023-09-09 ENCOUNTER — Other Ambulatory Visit: Payer: Self-pay | Admitting: Gynecologic Oncology

## 2023-09-09 DIAGNOSIS — G620 Drug-induced polyneuropathy: Secondary | ICD-10-CM

## 2023-09-09 DIAGNOSIS — T451X5A Adverse effect of antineoplastic and immunosuppressive drugs, initial encounter: Secondary | ICD-10-CM

## 2023-09-09 MED ORDER — OXYCODONE HCL 5 MG PO TABS: 5.0000 mg | ORAL_TABLET | Freq: Every evening | ORAL | 0 refills | Status: DC | PRN

## 2023-09-09 NOTE — Progress Notes (Signed)
 See recent mychart message and recent note. Message has been sent to patient about possible palliative care consult for further evaluation and management of her severe peripheral neuropathy symptoms from past chemotherapy treatments.

## 2023-09-12 ENCOUNTER — Other Ambulatory Visit: Payer: Self-pay | Admitting: *Deleted

## 2023-09-12 DIAGNOSIS — G620 Drug-induced polyneuropathy: Secondary | ICD-10-CM

## 2023-09-12 DIAGNOSIS — C541 Malignant neoplasm of endometrium: Secondary | ICD-10-CM

## 2023-09-20 ENCOUNTER — Telehealth: Payer: Self-pay

## 2023-09-20 NOTE — Telephone Encounter (Signed)
 Attempted to call pt to schedule palliative appt, no answer, LVM and callback number.

## 2023-09-20 NOTE — Telephone Encounter (Signed)
 Pt called back and appt scheduled, no further needs at this time.

## 2023-09-22 ENCOUNTER — Encounter: Payer: Self-pay | Admitting: Nurse Practitioner

## 2023-09-22 ENCOUNTER — Inpatient Hospital Stay: Attending: Gynecologic Oncology | Admitting: Nurse Practitioner

## 2023-09-22 VITALS — BP 114/67 | HR 72 | Temp 97.7°F | Resp 20 | Wt 128.6 lb

## 2023-09-22 DIAGNOSIS — Z515 Encounter for palliative care: Secondary | ICD-10-CM

## 2023-09-22 DIAGNOSIS — T451X5A Adverse effect of antineoplastic and immunosuppressive drugs, initial encounter: Secondary | ICD-10-CM

## 2023-09-22 DIAGNOSIS — M792 Neuralgia and neuritis, unspecified: Secondary | ICD-10-CM

## 2023-09-22 DIAGNOSIS — G893 Neoplasm related pain (acute) (chronic): Secondary | ICD-10-CM | POA: Diagnosis not present

## 2023-09-22 DIAGNOSIS — G620 Drug-induced polyneuropathy: Secondary | ICD-10-CM

## 2023-09-22 MED ORDER — PREGABALIN 50 MG PO CAPS: 50.0000 mg | ORAL_CAPSULE | Freq: Every day | ORAL | 2 refills | Status: DC

## 2023-09-22 MED ORDER — OXYCODONE HCL 5 MG PO TABS: 5.0000 mg | ORAL_TABLET | Freq: Every evening | ORAL | 0 refills | Status: DC | PRN

## 2023-09-25 NOTE — Progress Notes (Signed)
 Palliative Medicine [facility]  Telephone:([phone] Fax:([phone]   Name: [name] Date: 09/25/2023 MRN: [id number]  DOB: [birth year]  Patient Care Team: Deborah Norris, NP as PCP - General (Obstetrics and Gynecology) [name], Deborah SAILOR, NP as Nurse Practitioner (Hospice and Palliative Medicine) Deborah Valrie SQUIBB, RN as Registered Nurse    REASON FOR CONSULTATION: [name] is a 73 y.o. 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  Palliative is seeing patient for symptom management .    SOCIAL HISTORY:     reports that she has never smoked. She has never used smokeless tobacco. She reports current alcohol use. She reports that she does not use drugs.  ADVANCE DIRECTIVES:  None on file   CODE STATUS: Full code  PAST MEDICAL HISTORY: Past Medical History:  Diagnosis Date   Cancer (HCC) 1998   thyroid  ca   Degenerative arthritis of toe joint, right    great toe   Endometrial cancer (HCC)    Endometrial mass 10/15/2016   History of radiation therapy 01/18/17-02/23/17   vaginal cuff 30 Gy in 5 fractions   Hypothyroidism    Neuromuscular disorder (HCC)    neuropathy   Neuropathy    feet   Postmenopausal bleeding 10/15/2016    PAST SURGICAL HISTORY:  Past Surgical History:  Procedure Laterality Date   ABDOMINAL HYSTERECTOMY     ABDOMINOPLASTY     APPENDECTOMY     BUNIONECTOMY WITH CHILECTOMY Right 03/03/2016   Procedure: ROMAYNE WITH CHILECTOMY;  Surgeon: Dempsey Sensor, MD;  Location: [facility];  Service: Orthopedics;  Laterality: Right;   CHEILECTOMY Right 03/03/2016   Procedure: CHEILECTOMY RIGHT GREAT TOE;  Surgeon: Dempsey Sensor, MD;  Location: [facility];  Service: Orthopedics;  Laterality: Right;   COLONOSCOPY     COSMETIC SURGERY      DILATATION & CURETTAGE/HYSTEROSCOPY WITH MYOSURE N/A 10/15/2016   Procedure: DILATATION & CURETTAGE/HYSTEROSCOPY WITH MYOSURE;  Surgeon: Barbette Knock, MD;  Location: [facility];  Service: Gynecology;  Laterality: N/A;  Requests Microdilators and Lacrimal Duct Dilators   IR FLUORO GUIDE PORT INSERTION RIGHT  12/23/2016   IR REMOVAL TUN ACCESS W/ PORT W/O FL MOD SED  05/20/2017   IR US  GUIDE VASC ACCESS RIGHT  12/23/2016   OVARIAN CYST REMOVAL  1976   ROBOTIC ASSISTED BILATERAL SALPINGO OOPHERECTOMY Bilateral 11/23/2016   Procedure: XI ROBOTIC ASSISTED LEFT OOPHORECTOMY;  Surgeon: Eloy Herring, MD;  Location: [facility];  Service: Gynecology;  Laterality: Bilateral;   ROBOTIC ASSISTED TOTAL HYSTERECTOMY Bilateral 11/23/2016   Procedure: XI ROBOTIC ASSISTED TOTAL HYSTERECTOMY;  Surgeon: Eloy Herring, MD;  Location: [facility];  Service: Gynecology;  Laterality: Bilateral;   SENTINEL NODE BIOPSY Bilateral 11/23/2016   Procedure: SENTINEL NODE BIOPSY;  Surgeon: Eloy Herring, MD;  Location: [facility];  Service: Gynecology;  Laterality: Bilateral;   SHOULDER SURGERY Left 03/2015   by dr sensor at [facility]   THYROIDECTOMY  04/1996    HEMATOLOGY/ONCOLOGY HISTORY:  Oncology History Overview Note  MSI stable   Endometrial cancer (HCC)  10/15/2016 Pathology Results   Endometrium, resection - ENDOMETRIOID ADENOCARCINOMA. - SEE COMMENT. Microscopic Comment The carcinoma appears FIGO grade II. There are foci of adenocarcinoma involving smooth muscle.    10/15/2016 Surgery   Preoperative diagnosis: Postmenopausal bleeding, endometrial mass/ thick endometrial stripe.  Cervical stenosis    Postop diagnosis: Same.   Procedure: Hysteroscopic resection of endometrial mass with Myosure Lite and dilatation and curettage Anesthesia General  Surgeon: Robbi Render, MD  IV fluids: LR 1 liter Estimated blood loss : 100 cc  Saline Fluid deficit: 1060 cc  Urine output: straight catheter preop 25 cc   Complications none  Condition stable  Disposition PACU  Specimen: Endometrial mass resected tissue, endometrial curetting, endometrial fluid   11/23/2016 Pathology Results   1. Lymph node, sentinel, biopsy, right external iliac - NO CARCINOMA IDENTIFIED IN ONE LYMPH NODE (0/1) - ENDOSALPINGIOSIS 2. Lymph node, sentinel, biopsy, left external iliac - METASTATIC CARCINOMA INVOLVING ONE LYMPH NODE 1/1 - ENDOSALPINGIOSIS 3. Uterus +/- tubes/ovaries, neoplastic, and left ovary, cervix UTERUS:    - ENDOMETRIOID ADENOCARCINOMA, FIGO GRADE 3 OF 3 - LEIOMYOMATA (1.6 CM; LARGEST) - ADENOMYOSIS CERVIX: - NO CARCINOMA IDENTIFIED LEFT OVARY: - NO CARCINOMA IDENTIFIED Microscopic Comment 3. ONCOLOGY TABLE-UTERUS, CARCINOMA OR CARCINOSARCOMA Specimen: Uterus, cervix, left ovary, and sentinel lymph nodes Procedure: Total hysterectomy, left oophorectomy and sentinel lymph node biopsies Maximum tumor size: 6 cm Histologic type: Endometrioid carcinoma, not otherwise specified Grade: FIGO 3 Myometrial invasion: 1.3 cm where myometrium is 1.9 cm in thickness Cervical stromal involvement: Not identified Extent of involvement of other organs: Not identified Lymph - vascular invasion: Not identified Peritoneal washings: Not submitted/found Lymph nodes: Examined: 2 Sentinel 0 Non-sentinel 2 Total Lymph nodes with metastasis: 1 Isolated tumor cells (< 0.2 mm): 0 Micrometastasis: (> 0.2 mm and < 2.0 mm): 0 Macrometastasis: (> 2.0 mm): 1 Extracapsular extension: Not identified TNM code: pT1b, snpN1a FIGO Stage (based on pathologic findings, needs clinical correlation): IIIC1   11/23/2016 Surgery   Operation: Robotic-assisted laparoscopic total hysterectomy with left salpingoophorectomy,  Sentinel lymph node biopsy.   Surgeon: Eloy Maurilio Fitch   Assistant Surgeon: Olam Leonce Ada MD Operative Findings:  : 6cm normal appearing uterus. Surgically absent right ovary and bilateral fallopian  tubes. No suspcious nodes. Adhesions between left ovary and left ovarian fossa.     12/09/2016 PET scan   Recent hysterectomy with small amount of free fluid in pelvis. No evidence of the residual or metastatic carcinoma.   Single 9 mm left level 2A cervical lymph node with low-grade metabolic activity, which is nonspecific but likely reactive in etiology.   Incidentally noted sigmoid diverticulosis and cholelithiasis.   12/23/2016 Procedure   Placement of a subcutaneous port device   12/27/2016 - 04/19/2017 Chemotherapy   She received carboplatin  and Taxol  x 6 cycles   01/18/2017 - 02/23/2017 Radiation Therapy   Radiation treatment dates:   01/18/17, 01/27/17, 02/09/17, 02/16/17, 02/23/17   Site/dose:   Vaginal cuff / 30 Gy delivered in 5 fractions   Beams/energy:   HDR Ir-192 Vaginal     05/20/2017 Procedure   Removal of implanted Port-A-Cath utilizing sharp and blunt dissection. The procedure was uncomplicated     ALLERGIES:  is allergic to celebrex [celecoxib], gabapentin , and meloxicam .  MEDICATIONS:  Current Outpatient Medications  Medication Sig Dispense Refill   pregabalin  (LYRICA ) 50 MG capsule Take 1 capsule (50 mg total) by mouth at bedtime. 30 capsule 2   cyclobenzaprine (FLEXERIL) 10 MG tablet SMARTSIG:0.5-1 Tablet(s) By Mouth 2-3 Times Daily PRN     levothyroxine  (SYNTHROID ) 137 MCG tablet Take by mouth.     OVER THE COUNTER MEDICATION Take 5 tablets by mouth 3 (three) times a week. Pyridoxal 1, 2, 3, and 4 otc supplement  Vitamin package  OVER THE COUNTER MEDICATION Take 1 tablet by mouth as directed. Sulfur 24 g7 otc supplement take 1 tablet daily 5 days out of the week  Adrenaline gland support     OVER THE COUNTER MEDICATION Take 1 capsule by mouth See admin instructions. Carbon 90 bx otc supplement take 1 capsule daily 5 days out of the week  Omega 3 supplement     oxyCODONE  (OXY IR/ROXICODONE ) 5 MG immediate release tablet Take 1 tablet (5 mg total) by mouth  at bedtime as needed for severe pain (pain score 7-10). Take at bedtime as needed for severe pain, do not take and drive, do not take with other pain medications 15 tablet 0   No current facility-administered medications for this visit.    VITAL SIGNS: BP 114/67   Pulse 72   Temp 97.7 F (36.5 C)   Resp 20   Wt 128 lb 9.6 oz (58.3 kg)   SpO2 98%   BMI 19.55 kg/m  Filed Weights   09/22/23 1116  Weight: 128 lb 9.6 oz (58.3 kg)    Estimated body mass index is 19.55 kg/m as calculated from the following:   Height as of 06/01/22: 5' 8 (1.727 m).   Weight as of this encounter: 128 lb 9.6 oz (58.3 kg).  LABS: CBC:    Component Value Date/Time   WBC 3.8 (L) 05/20/2017 0746   HGB 11.6 (L) 05/20/2017 0746   HGB 11.8 03/01/2017 1007   HCT 34.3 (L) 05/20/2017 0746   HCT 35.5 03/01/2017 1007   PLT 161 05/20/2017 0746   PLT 172 03/01/2017 1007   MCV 102.7 (H) 05/20/2017 0746   MCV 100.3 03/01/2017 1007   NEUTROABS 2.0 05/20/2017 0746   NEUTROABS 8.0 (H) 03/01/2017 1007   LYMPHSABS 1.3 05/20/2017 0746   LYMPHSABS 0.6 (L) 03/01/2017 1007   MONOABS 0.4 05/20/2017 0746   MONOABS 0.1 03/01/2017 1007   EOSABS 0.1 05/20/2017 0746   EOSABS 0.0 03/01/2017 1007   BASOSABS 0.0 05/20/2017 0746   BASOSABS 0.0 03/01/2017 1007   Comprehensive Metabolic Panel:    Component Value Date/Time   NA 139 05/12/2020 1339   NA 141 03/01/2017 1007   K 4.3 05/12/2020 1339   K 4.0 03/01/2017 1007   CL 105 05/12/2020 1339   CO2 26 05/12/2020 1339   CO2 21 (L) 03/01/2017 1007   BUN 24 (H) 05/12/2020 1339   BUN 26.9 (H) 03/01/2017 1007   CREATININE 0.77 05/12/2020 1339   CREATININE 0.8 03/01/2017 1007   GLUCOSE 95 05/12/2020 1339   GLUCOSE 208 (H) 03/01/2017 1007   CALCIUM 9.2 05/12/2020 1339   CALCIUM 9.2 03/01/2017 1007   AST 14 04/19/2017 0850   AST 13 03/01/2017 1007   ALT 16 04/19/2017 0850   ALT 16 03/01/2017 1007   ALKPHOS 83 04/19/2017 0850   ALKPHOS 85 03/01/2017 1007   BILITOT  0.3 04/19/2017 0850   BILITOT 0.37 03/01/2017 1007   PROT 6.9 04/19/2017 0850   PROT 7.2 03/01/2017 1007   ALBUMIN 4.2 04/19/2017 0850   ALBUMIN 4.2 03/01/2017 1007    RADIOGRAPHIC STUDIES: No results found.  PERFORMANCE STATUS (ECOG) : 1 - Symptomatic but completely ambulatory  Review of Systems  Neurological:        Bilateral feet numbness, pain   Unless otherwise noted, a complete review of systems is negative.  Physical Exam General: NAD Cardiovascular: regular rate and rhythm Pulmonary: clear ant fields Abdomen: soft, nontender, + bowel sounds Extremities: no edema, no  joint deformities Skin: no rashes Neurological: Alert and oriented x3  Discussed the use of AI scribe software for clinical note transcription with the patient, who gave verbal consent to proceed.  History of Present Illness ANGLEA GORDNER is a 73 year old female with history of endometrial cancer s/p surgery, chemoradiation, who presents for initial palliative visit specifically for pain management related to worsening neuropathy. No family present. Patient is alert and able to engage in discussions appropriately.   I introduced myself, Maygan RN, and Palliative's role in collaboration with the oncology team. Concept of Palliative Care was introduced as specialized medical care for people and their families living with serious illness.  It focuses on providing relief from the symptoms and stress of a serious illness.  The goal is to improve quality of life for both the patient and the family. Values and goals of care important to patient and family were attempted to be elicited.  Mrs. Grubb lives in the home with her husband of more than forty years, no children. 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 She maintains a positive attitude despite the challenges posed by her condition.  We discussed her current illness and what it means in the larger context of her on-going co-morbidities. Natural disease trajectory and expectations were discussed. Mrs. Swartout is realistic in her expressed understanding of overall health status expressing health overall is good with biggest concern related to her bilateral feet neuropathy which affects her quality of life.   She has a history of thyroid  cancer treated in 1998 and has been on thyroid  medication since. She experiences burning in her feet triggered by certain foods and oils, such as high fructose corn syrup, sugar, soy oil, flaxseed oil, and sunflower oil, which she avoids. Canola and olive oils are tolerated. She has adjusted her diet significantly to manage these symptoms, guided by a wellness doctor she has consulted with since 2003.  Natane has been experiencing severe neuropathy for the past six years following chemotherapy, which significantly disrupts her sleep, causing her to wake up once or twice a night due to intense pain. She has tried various medications including gabapentin , Celebrex, and ibuprofen. While ibuprofen provides some relief, it does not eliminate the pain.   She has a history of two recent ankle surgeries and was prescribed low-dose oxycodone  for postoperative pain, which she used sparingly, alternating with Tylenol . About six weeks ago, she began using oxycodone  at night to manage her neuropathy pain, which has improved her sleep quality. She is cautious about the potential for dependency and has discussed this with her husband, who is supportive and aware of her medication use.  Complete medication, chart, and social history review completed. Patient denies any alcohol or illicit drug use. Extensive discussions and explanation of palliative's role in collaboration with his oncology team to assist in patient's pain and symptom  management. Education provided on criteria for ongoing pain support via palliative team. Extensive education to allow for safe management.   I offered recommendations to manage neuropathy pain to include Pregabalin  for her symptoms. Patient expresses concerns of drowsiness and inability to function during the day with use of sedating medications. Education provided on use, efficacy, administration, and potential side effects of Lyrica . She verbalized understanding. She expressed willingness to try with  hopes of managing discomfort and eliminating or decreasing use of oxycodone . Patient however would only like to take at night initially. Acknowledged her request. Mrs. Mclamb is aware the goal is to hopeful decrease her symptoms allowing for improved quality of life and symptoms although with awareness symptoms may not completely resolve. Continue oxycodone  for now.   We will plan to closely monitor and adjust medications as needed. All questions answered and support provided.   Assessment & Plan Established therapeutic relationship. Education provided on palliative's role in collaboration with their Oncology/Radiation team.  Neuropathy Chronic neuropathy with severe pain exacerbated by certain triggers. Previous treatments ineffective. Current low-dose oxycodone  improves sleep but has long-term use concerns. Lyrica  considered to reduce oxycodone  reliance. - Prescribe pregabalin  50 mg at bedtime. - Continue oxycodone  as needed, ensuring at least a 2-hour gap between pregabalin  and oxycodone . - Monitor for pregabalin  side effects, including drowsiness and dizziness. - Advise to contact office or use MyChart for adverse effects or concerns. - Plan follow-up phone visit on July 16th at 11:00 AM to assess pregabalin  effectiveness and adjust treatment.  Patient expressed understanding and was in agreement with this plan. She also understands that She can call the clinic at any time with any questions,  concerns, or complaints.   Thank you for your referral and allowing Palliative to assist in Mrs. Nat RAMAN Obrien's care.   Number and complexity of problems addressed: HIGH - 1 or more chronic illnesses with SEVERE exacerbation, progression, or side effects of treatment - advanced cancer, pain. Any controlled substances utilized were prescribed in the context of palliative care.  Visit consisted of counseling and education dealing with the complex and emotionally intense issues of symptom management and palliative care in the setting of serious and potentially life-threatening illness.  Signed by: Levon Borer, AGPCNP-BC Palliative Medicine Team/[facility]

## 2023-10-05 ENCOUNTER — Inpatient Hospital Stay: Admitting: Nurse Practitioner

## 2023-10-06 NOTE — Progress Notes (Signed)
 Erroneous

## 2023-10-10 ENCOUNTER — Other Ambulatory Visit: Payer: Self-pay | Admitting: Nurse Practitioner

## 2023-10-10 DIAGNOSIS — G620 Drug-induced polyneuropathy: Secondary | ICD-10-CM

## 2023-10-10 DIAGNOSIS — Z515 Encounter for palliative care: Secondary | ICD-10-CM

## 2023-10-10 DIAGNOSIS — G893 Neoplasm related pain (acute) (chronic): Secondary | ICD-10-CM

## 2023-10-10 MED ORDER — OXYCODONE HCL 5 MG PO TABS: 5.0000 mg | ORAL_TABLET | Freq: Every evening | ORAL | 0 refills | Status: DC | PRN

## 2023-10-12 ENCOUNTER — Telehealth: Payer: Self-pay | Admitting: Nurse Practitioner

## 2023-10-12 NOTE — Telephone Encounter (Signed)
 Left patient a vm regarding upcoming appointment

## 2023-10-18 ENCOUNTER — Encounter: Payer: Self-pay | Admitting: Nurse Practitioner

## 2023-10-18 ENCOUNTER — Inpatient Hospital Stay (HOSPITAL_BASED_OUTPATIENT_CLINIC_OR_DEPARTMENT_OTHER): Admitting: Nurse Practitioner

## 2023-10-18 DIAGNOSIS — G893 Neoplasm related pain (acute) (chronic): Secondary | ICD-10-CM

## 2023-10-18 DIAGNOSIS — Z515 Encounter for palliative care: Secondary | ICD-10-CM | POA: Diagnosis not present

## 2023-10-18 DIAGNOSIS — C541 Malignant neoplasm of endometrium: Secondary | ICD-10-CM

## 2023-10-18 DIAGNOSIS — M792 Neuralgia and neuritis, unspecified: Secondary | ICD-10-CM | POA: Diagnosis not present

## 2023-10-18 MED ORDER — DULOXETINE HCL 30 MG PO CPEP: 30.0000 mg | ORAL_CAPSULE | Freq: Every day | ORAL | 3 refills | Status: DC

## 2023-10-18 NOTE — Progress Notes (Signed)
 Palliative Medicine [facility]  Telephone:(336) [phone] Fax:(336) [phone]   Name: [name] Date: 10/18/2023 MRN: [id number]  DOB: 07-Mar-1951  Patient Care Team: Deborah Norris, NP as PCP - General (Obstetrics and Gynecology) [place], Deborah SAILOR, NP as Nurse Practitioner (Hospice and Palliative Medicine) Deborah Valrie SQUIBB, RN as Registered Nurse   I connected with [name] on 10/18/23 at  3:30 PM EDT by telephone and verified that I am speaking with the correct person using two identifiers.   I discussed the limitations, risks, security and privacy concerns of performing an evaluation and management service by telemedicine and the availability of in-person appointments. I also discussed with the patient that there may be a patient responsible charge related to this service. The patient expressed understanding and agreed to proceed.   Other persons participating in the visit and their role in the encounter: N/A   Patient's location: Home   Provider's location: [facility]   INTERVAL HISTORY: [name] is a 73 y.o. 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  Palliative is seeing patient for symptom management .   SOCIAL HISTORY:     reports that she has never smoked. She has never used smokeless tobacco. She reports current alcohol use. She reports that she does not use drugs.  ADVANCE DIRECTIVES:  None on file   CODE STATUS: Full code  PAST MEDICAL HISTORY: Past Medical History:  Diagnosis Date   Cancer (HCC) 1998   thyroid  ca   Degenerative arthritis of toe joint, right    great toe   Endometrial cancer (HCC)    Endometrial mass 10/15/2016   History of radiation therapy 01/18/17-02/23/17   vaginal cuff 30 Gy in 5 fractions   Hypothyroidism    Neuromuscular disorder (HCC)     neuropathy   Neuropathy    feet   Postmenopausal bleeding 10/15/2016    ALLERGIES:  is allergic to celebrex [celecoxib], gabapentin , and meloxicam .  MEDICATIONS:  Current Outpatient Medications  Medication Sig Dispense Refill   DULoxetine  (CYMBALTA ) 30 MG capsule Take 1 capsule (30 mg total) by mouth daily. 30 capsule 3   cyclobenzaprine (FLEXERIL) 10 MG tablet SMARTSIG:0.5-1 Tablet(s) By Mouth 2-3 Times Daily PRN     levothyroxine  (SYNTHROID ) 137 MCG tablet Take by mouth.     OVER THE COUNTER MEDICATION Take 5 tablets by mouth 3 (three) times a week. Pyridoxal 1, 2, 3, and 4 otc supplement  Vitamin package     OVER THE COUNTER MEDICATION Take 1 tablet by mouth as directed. Sulfur 24 g7 otc supplement take 1 tablet daily 5 days out of the week  Adrenaline gland support     OVER THE COUNTER MEDICATION Take 1 capsule by mouth See admin instructions. Carbon 90 bx otc supplement take 1 capsule daily 5 days out of the week  Omega 3 supplement     oxyCODONE  (OXY IR/ROXICODONE ) 5 MG immediate release tablet Take 1 tablet (5 mg total) by mouth at bedtime as needed for severe pain (pain score 7-10). Take at bedtime as needed for severe pain, do not take and drive, do not take with other pain medications 15 tablet 0   No current facility-administered medications for this visit.    VITAL SIGNS: There were no vitals taken for this visit. There were no vitals filed for this visit.  Estimated body mass index is 19.55 kg/m  as calculated from the following:   Height as of 06/01/22: 5' 8 (1.727 m).   Weight as of 09/22/23: 128 lb 9.6 oz (58.3 kg).   PERFORMANCE STATUS (ECOG) : 1 - Symptomatic but completely ambulatory   IMPRESSION: Discussed the use of AI scribe software for clinical note transcription with the patient, who gave verbal consent to proceed.  History of Present Illness [name] is a 73 year old female with endometrial cancer who I connected with by phone for symptom management  follow-up. Patient is complaining of worsening neuropathic pain symptoms specifically at night time hours. She denies concerns for nausea, vomiting, constipation, or diarrhea. Remains as active as possible despite neuropathy.   She experiences persistent neuropathic pain that significantly worsens when lying down, occurring within 30 seconds of assuming a horizontal position. This has been a long-standing issue, causing fragmented sleep as she wakes multiple times to manage the pain.  Her current medication regimen includes ibuprofen taken around 10 or 10:30 PM before lying down, but she still wakes up around 2 AM to take an oxycodone  pill, and again around 5 AM to eat and take her additional medications. She also takes ibuprofen again after eating around 9 AM. Despite these efforts, she only manages to get a couple of hours of sleep at a time.  She has previously tried Lyrica , Celebrex, gabapentin , meloxicam , and tramadol  for her symptoms. Lyrica  initially seemed to help but then caused her feet to burn more intensely, leading her to discontinue it after about ten days.  Unfortunately she experienced similar side effects with the other mentioned medications also.   [name] wants to find an alternative to oxycodone  due to concerns about its use, although she does not feel at risk of dependency as she only uses it at night. Patient does not require frequent refills and is using medications responsibly.   I discussed at length additional options to attempt to manage her neuropathic pain which included Effexor or Cymbalta . Education provided on use, efficacy, administration, and potential side effects. She verbalized understanding and willing to trial with hopes of some improvement in symptoms.   We will continue to closely monitor and adjust regimen as needed. All questions answered and support provided.   Assessment & Plan Peripheral neuropathy Chronic peripheral neuropathy with exacerbation of  symptoms when lying down. Previous trials of Lyrica , Celebrex, gabapentin , meloxicam , and tramadol  have worsened symptoms. Considering alternative treatments due to limited efficacy and adverse effects of previous medications. Discussed the potential use of Effexor and Cymbalta , which are typically used for neuropathy pain in cancer patients despite being classified as antidepressants. Cymbalta  is preferred due to more frequent use in similar cases. She is willing to try Cymbalta  to manage symptoms and reduce reliance on oxycodone . - Prescribe Cymbalta  30 mg to be taken at bedtime. - Advise monitoring for changes in mood as a potential side effect. - Plan follow-up appointment on August 12th at 3:30 PM to assess efficacy and side effects. - Instruct to stop medication and contact provider if any severe side effects occur. - Send prescription to [facility] at [address]. - Advise contacting provider if insurance authorization is required. -Continue Oxycodone  as needed.   Patient expressed understanding and was in agreement with this plan. She also understands that She can call the clinic at any time with any questions, concerns, or complaints.   Any controlled substances utilized were prescribed in the context of palliative care. PDMP has been reviewed.   Visit  consisted of counseling and education dealing with the complex and emotionally intense issues of symptom management and palliative care in the setting of serious and potentially life-threatening illness.  Levon Borer, AGPCNP-BC  Palliative Medicine Team/[facility]

## 2023-10-19 ENCOUNTER — Telehealth

## 2023-10-21 DIAGNOSIS — T451X5A Adverse effect of antineoplastic and immunosuppressive drugs, initial encounter: Secondary | ICD-10-CM

## 2023-10-21 DIAGNOSIS — G893 Neoplasm related pain (acute) (chronic): Secondary | ICD-10-CM

## 2023-10-21 DIAGNOSIS — Z515 Encounter for palliative care: Secondary | ICD-10-CM

## 2023-10-22 MED ORDER — OXYCODONE HCL 5 MG PO TABS: 5.0000 mg | ORAL_TABLET | Freq: Two times a day (BID) | ORAL | 0 refills | Status: DC | PRN

## 2023-10-22 MED ORDER — VENLAFAXINE HCL ER 37.5 MG PO CP24: 37.5000 mg | ORAL_CAPSULE | Freq: Every day | ORAL | 0 refills | Status: DC

## 2023-10-31 ENCOUNTER — Telehealth: Payer: Self-pay

## 2023-10-31 NOTE — Telephone Encounter (Signed)
 Pt called to r/s palliative appointment due to a delay in starting her medication. Appts, changed as requested and NP notified.

## 2023-11-01 ENCOUNTER — Inpatient Hospital Stay

## 2023-11-03 ENCOUNTER — Other Ambulatory Visit: Payer: Self-pay | Admitting: Nurse Practitioner

## 2023-11-03 DIAGNOSIS — M792 Neuralgia and neuritis, unspecified: Secondary | ICD-10-CM

## 2023-11-03 DIAGNOSIS — G893 Neoplasm related pain (acute) (chronic): Secondary | ICD-10-CM

## 2023-11-03 DIAGNOSIS — Z515 Encounter for palliative care: Secondary | ICD-10-CM

## 2023-11-03 DIAGNOSIS — T451X5A Adverse effect of antineoplastic and immunosuppressive drugs, initial encounter: Secondary | ICD-10-CM

## 2023-11-08 ENCOUNTER — Inpatient Hospital Stay: Attending: Gynecologic Oncology

## 2023-11-16 DIAGNOSIS — M7672 Peroneal tendinitis, left leg: Secondary | ICD-10-CM | POA: Diagnosis not present

## 2023-11-17 ENCOUNTER — Other Ambulatory Visit: Payer: Self-pay

## 2023-11-17 DIAGNOSIS — Z515 Encounter for palliative care: Secondary | ICD-10-CM

## 2023-11-17 DIAGNOSIS — G893 Neoplasm related pain (acute) (chronic): Secondary | ICD-10-CM

## 2023-11-17 DIAGNOSIS — G620 Drug-induced polyneuropathy: Secondary | ICD-10-CM

## 2023-11-17 MED ORDER — OXYCODONE HCL 5 MG PO TABS
ORAL_TABLET | ORAL | 0 refills | Status: AC
Start: 2023-11-17 — End: 2023-12-02

## 2023-11-24 ENCOUNTER — Telehealth: Payer: Self-pay | Admitting: Nurse Practitioner

## 2023-11-24 NOTE — Telephone Encounter (Signed)
 Scheduled palliative care. Called and left the patient a voicemail with appointment details

## 2023-12-08 ENCOUNTER — Other Ambulatory Visit: Payer: Self-pay | Admitting: Gynecologic Oncology

## 2023-12-08 ENCOUNTER — Telehealth: Payer: Self-pay

## 2023-12-08 DIAGNOSIS — C541 Malignant neoplasm of endometrium: Secondary | ICD-10-CM

## 2023-12-08 MED ORDER — COVID-19 MRNA VAC-TRIS(PFIZER) 30 MCG/0.3ML IM SUSY: 0.3000 mL | PREFILLED_SYRINGE | Freq: Once | INTRAMUSCULAR | 0 refills | Status: AC

## 2023-12-08 NOTE — Telephone Encounter (Signed)
 Ms.Hannay called the office today requesting Deborah Epps NP send in a prescription for a COVID shot to pharmacy on file. She states her understanding is being a cancer survivor she will need a prescription for the shot.   Message sent to provider.

## 2023-12-08 NOTE — Telephone Encounter (Signed)
 I spoke to [name] at [facility], she states she did receive the prescription for the COVID vaccine  [name] is aware, via voicemail, ok to call pharmacy and schedule an appointment.

## 2023-12-21 ENCOUNTER — Other Ambulatory Visit: Payer: Self-pay | Admitting: Nurse Practitioner

## 2023-12-21 DIAGNOSIS — M792 Neuralgia and neuritis, unspecified: Secondary | ICD-10-CM

## 2023-12-21 DIAGNOSIS — C541 Malignant neoplasm of endometrium: Secondary | ICD-10-CM

## 2023-12-21 DIAGNOSIS — Z515 Encounter for palliative care: Secondary | ICD-10-CM

## 2023-12-21 DIAGNOSIS — G893 Neoplasm related pain (acute) (chronic): Secondary | ICD-10-CM

## 2023-12-21 MED ORDER — OXYCODONE HCL 5 MG PO TABS: 5.0000 mg | ORAL_TABLET | Freq: Three times a day (TID) | ORAL | 0 refills | Status: DC | PRN

## 2023-12-29 ENCOUNTER — Encounter

## 2024-01-03 ENCOUNTER — Inpatient Hospital Stay: Admitting: Nurse Practitioner

## 2024-01-23 ENCOUNTER — Other Ambulatory Visit: Payer: Self-pay

## 2024-01-23 DIAGNOSIS — C541 Malignant neoplasm of endometrium: Secondary | ICD-10-CM

## 2024-01-23 DIAGNOSIS — M792 Neuralgia and neuritis, unspecified: Secondary | ICD-10-CM

## 2024-01-23 DIAGNOSIS — Z515 Encounter for palliative care: Secondary | ICD-10-CM

## 2024-01-23 DIAGNOSIS — G893 Neoplasm related pain (acute) (chronic): Secondary | ICD-10-CM

## 2024-01-23 MED ORDER — OXYCODONE HCL 5 MG PO TABS: 5.0000 mg | ORAL_TABLET | Freq: Three times a day (TID) | ORAL | 0 refills | Status: DC | PRN

## 2024-01-26 DIAGNOSIS — E559 Vitamin D deficiency, unspecified: Secondary | ICD-10-CM | POA: Diagnosis not present

## 2024-01-26 DIAGNOSIS — I7090 Unspecified atherosclerosis: Secondary | ICD-10-CM | POA: Diagnosis not present

## 2024-01-26 DIAGNOSIS — R5383 Other fatigue: Secondary | ICD-10-CM | POA: Diagnosis not present

## 2024-01-26 DIAGNOSIS — R7989 Other specified abnormal findings of blood chemistry: Secondary | ICD-10-CM | POA: Diagnosis not present

## 2024-01-26 DIAGNOSIS — E039 Hypothyroidism, unspecified: Secondary | ICD-10-CM | POA: Diagnosis not present

## 2024-01-31 ENCOUNTER — Inpatient Hospital Stay: Attending: Nurse Practitioner | Admitting: Nurse Practitioner

## 2024-01-31 ENCOUNTER — Encounter: Payer: Self-pay | Admitting: Nurse Practitioner

## 2024-01-31 VITALS — BP 121/58 | HR 71 | Temp 97.7°F | Resp 16 | Ht 68.0 in | Wt 135.0 lb

## 2024-01-31 DIAGNOSIS — E039 Hypothyroidism, unspecified: Secondary | ICD-10-CM | POA: Diagnosis not present

## 2024-01-31 DIAGNOSIS — M792 Neuralgia and neuritis, unspecified: Secondary | ICD-10-CM

## 2024-01-31 DIAGNOSIS — C541 Malignant neoplasm of endometrium: Secondary | ICD-10-CM

## 2024-01-31 DIAGNOSIS — Z515 Encounter for palliative care: Secondary | ICD-10-CM

## 2024-01-31 NOTE — Progress Notes (Signed)
 Palliative Medicine [facility]  Telephone:([phone]) [phone] Fax:(336) [phone]   Name: [name] Date: 01/31/2024 MRN: [id number]  DOB: 06-29-1950  Patient Care Team: Stuart Norris, NP as PCP - General (Obstetrics and Gynecology) [place], Fannie SAILOR, NP as Nurse Practitioner (Hospice and Palliative Medicine) Silvano Valrie SQUIBB, RN as Registered Nurse   INTERVAL HISTORY: [name] is a 73 y.o. 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  Palliative is seeing patient for symptom management .   SOCIAL HISTORY:     reports that she has never smoked. She has never used smokeless tobacco. She reports current alcohol use. She reports that she does not use drugs.  ADVANCE DIRECTIVES:  None on file   CODE STATUS: Full code  PAST MEDICAL HISTORY: Past Medical History:  Diagnosis Date   Cancer (HCC) 1998   thyroid  ca   Degenerative arthritis of toe joint, right    great toe   Endometrial cancer (HCC)    Endometrial mass 10/15/2016   History of radiation therapy 01/18/17-02/23/17   vaginal cuff 30 Gy in 5 fractions   Hypothyroidism    Neuromuscular disorder (HCC)    neuropathy   Neuropathy    feet   Postmenopausal bleeding 10/15/2016    ALLERGIES:  is allergic to celebrex [celecoxib], gabapentin , and meloxicam .  MEDICATIONS:  Current Outpatient Medications  Medication Sig Dispense Refill   cyclobenzaprine (FLEXERIL) 10 MG tablet SMARTSIG:0.5-1 Tablet(s) By Mouth 2-3 Times Daily PRN     levothyroxine  (SYNTHROID ) 137 MCG tablet Take by mouth.     OVER THE COUNTER MEDICATION Take 5 tablets by mouth 3 (three) times a week. Pyridoxal 1, 2, 3, and 4 otc supplement  Vitamin package     OVER THE COUNTER MEDICATION Take 1 tablet by mouth as directed. Sulfur 24 g7 otc supplement take 1 tablet daily 5  days out of the week  Adrenaline gland support     OVER THE COUNTER MEDICATION Take 1 capsule by mouth See admin instructions. Carbon 90 bx otc supplement take 1 capsule daily 5 days out of the week  Omega 3 supplement     oxyCODONE  (OXY IR/ROXICODONE ) 5 MG immediate release tablet Take 1 tablet (5 mg total) by mouth every 8 (eight) hours as needed for severe pain (pain score 7-10) or moderate pain (pain score 4-6). 45 tablet 0   venlafaxine  XR (EFFEXOR -XR) 37.5 MG 24 hr capsule Take 1 capsule (37.5 mg total) by mouth daily with breakfast. 90 capsule 1   No current facility-administered medications for this visit.    VITAL SIGNS: BP (!) 121/58 (BP Location: Left Arm, Patient Position: Sitting) Comment: nurse is aware  Pulse 71   Temp 97.7 F (36.5 C) (Temporal)   Resp 16   Ht 5' 8 (1.727 m)   Wt 135 lb (61.2 kg)   SpO2 99%   BMI 20.53 kg/m  Filed Weights   01/31/24 0846  Weight: 135 lb (61.2 kg)    Estimated body mass index is 20.53 kg/m as calculated from the following:   Height as of this encounter: 5' 8 (1.727 m).   Weight as of this encounter: 135 lb (61.2 kg).   PERFORMANCE STATUS (ECOG) : 1 - Symptomatic but completely ambulatory  Assessment NAD RRR Normal breathing pattern AAo x3  IMPRESSION: Discussed the use of AI scribe software for clinical note transcription with the  patient, who gave verbal consent to proceed.  History of Present Illness [name] is a 73 year old female with endometrial cancer who presented to clinic for symptom management follow-up. She has been doing well overall. Continues to be challenged with neuropathy in feet and some ankle discomfort. Denies concerns of nausea, vomiting, constipation, or diarrhea. Occasional fatigue however remains active. She plays Bridge and has been able to attend as desired.   She has been experiencing persistent neuropathy, which she attributes to chemotherapy. Various treatments have been explored,  including a balm called Gattis Dub, which provided limited relief, and a new product (Nerve Balm) that seemed to work quickly. Additionally, she uses homeopathic Arnica tablets for further relief.  She is undergoing Sonowave treatment, involving shock wave therapy aimed at her weak ankles initially. The treatment consists of five sessions a week apart, followed by two more monthly sessions followed by a ten week period to stimulate her stem cell. Her ankles remain weak, and she experiences significant pain if she walks too far, necessitating the use of ice. An incident at a bridge tournament exacerbated her ankle pain due to a hard mattress, requiring her to seek ice in the middle of the night.  She has been taking alpha-lipoic acid (ALA) for neuropathy, which has allowed her to wiggle her toes after about thirty days of use. However, her feet have been hurting more in the last couple of weeks, which she attributes to wearing socks that cause discomfort. She manages the pain with ibuprofen and occasionally uses low-dose oxycodone  at night or in the middle of the night for relief. The oxycodone  reduces the pain but does not eliminate it, and she does not experience any significant side effects from the medication.   [name] wants to find an alternative to oxycodone  due to concerns about its use, although she does not feel at risk of dependency as she only uses it at night. Patient does not require frequent refills and is using medications responsibly.   I discussed at length additional options to attempt to manage her neuropathic pain which included Effexor  or Cymbalta . Education provided on use, efficacy, administration, and potential side effects. She verbalized understanding and willing to trial with hopes of some improvement in symptoms.   We will continue to closely monitor and adjust regimen as needed. All questions answered and support provided.   Assessment & Plan Peripheral neuropathy with  chronic pain of feet and ankles Chronic peripheral neuropathy with significant pain in feet and ankles, exacerbated by wearing socks and physical activity. Pain rated 3-4/10 during the day, increasing at night. Previous treatments include homeopathic Arnica tablets and topical balm with limited relief. Exploring Sonowave therapy, a shockwave treatment, for potential pain relief and tissue strengthening. Concerns about weak connective tissue around the ankle affecting mobility. Currently using low-dose oxycodone  for pain management, providing some relief without significant side effects. She is cautious about medication use but finds oxycodone  helpful for nighttime pain. - Continue Sonowave therapy for ankle pain and tissue strengthening. - Continue low-dose oxycodone  for pain management as needed, primarily at night. - Scheduled follow-up appointment in six weeks to reassess pain management and treatment efficacy.  Patient expressed understanding and was in agreement with this plan. She also understands that She can call the clinic at any time with any questions, concerns, or complaints.   Any controlled substances utilized were prescribed in the context of palliative care. PDMP has been reviewed.   Visit consisted of counseling and  education dealing with the complex and emotionally intense issues of symptom management and palliative care in the setting of serious and potentially life-threatening illness.  [name], AGPCNP-BC  Palliative Medicine Team/[facility]

## 2024-02-13 ENCOUNTER — Other Ambulatory Visit: Payer: Self-pay | Admitting: Nurse Practitioner

## 2024-02-13 DIAGNOSIS — C541 Malignant neoplasm of endometrium: Secondary | ICD-10-CM

## 2024-02-13 DIAGNOSIS — Z515 Encounter for palliative care: Secondary | ICD-10-CM

## 2024-02-13 DIAGNOSIS — M792 Neuralgia and neuritis, unspecified: Secondary | ICD-10-CM

## 2024-02-13 DIAGNOSIS — G893 Neoplasm related pain (acute) (chronic): Secondary | ICD-10-CM

## 2024-02-13 MED ORDER — OXYCODONE HCL 5 MG PO TABS: 5.0000 mg | ORAL_TABLET | Freq: Three times a day (TID) | ORAL | 0 refills | Status: DC | PRN

## 2024-02-29 ENCOUNTER — Encounter: Payer: Self-pay | Admitting: Gynecologic Oncology

## 2024-03-12 ENCOUNTER — Telehealth: Payer: Self-pay | Admitting: Nurse Practitioner

## 2024-03-13 ENCOUNTER — Inpatient Hospital Stay: Attending: Nurse Practitioner

## 2024-03-14 ENCOUNTER — Encounter: Payer: Self-pay | Admitting: Nurse Practitioner

## 2024-03-14 ENCOUNTER — Inpatient Hospital Stay: Attending: Nurse Practitioner | Admitting: Nurse Practitioner

## 2024-03-14 DIAGNOSIS — M792 Neuralgia and neuritis, unspecified: Secondary | ICD-10-CM

## 2024-03-14 DIAGNOSIS — Z515 Encounter for palliative care: Secondary | ICD-10-CM

## 2024-03-14 DIAGNOSIS — G629 Polyneuropathy, unspecified: Secondary | ICD-10-CM

## 2024-03-14 DIAGNOSIS — C541 Malignant neoplasm of endometrium: Secondary | ICD-10-CM | POA: Diagnosis not present

## 2024-03-14 DIAGNOSIS — G893 Neoplasm related pain (acute) (chronic): Secondary | ICD-10-CM

## 2024-03-14 MED ORDER — OXYCODONE HCL 5 MG PO TABS: 5.0000 mg | ORAL_TABLET | Freq: Three times a day (TID) | ORAL | 0 refills | Status: DC | PRN

## 2024-03-14 NOTE — Progress Notes (Signed)
 "    Palliative Medicine [facility]  Telephone:(336) [phone] Fax:(336) [phone]   Name: [name] Date: 03/14/2024 MRN: [id number]  DOB: 04-18-1950  Patient Care Team: Stuart Norris, NP as PCP - General (Obstetrics and Gynecology) [place], Fannie SAILOR, NP as Nurse Practitioner (Hospice and Palliative Medicine) Silvano Valrie SQUIBB, RN as Registered Nurse   I connected with Nat GORMAN Myron on 03/14/2024 at  9:15 AM EST by telephone and verified that I am speaking with the correct person using two identifiers.   I discussed the limitations, risks, security and privacy concerns of performing an evaluation and management service by telemedicine and the availability of in-person appointments. I also discussed with the patient that there may be a patient responsible charge related to this service. The patient expressed understanding and agreed to proceed.   Other persons participating in the visit and their role in the encounter: N/A   Patients location: Home   Providers location: [facility]    INTERVAL HISTORY: KORENA NASS is a 73 y.o. 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  Palliative is seeing patient for symptom management .   SOCIAL HISTORY:     reports that she has never smoked. She has never used smokeless tobacco. She reports current alcohol use. She reports that she does not use drugs.  ADVANCE DIRECTIVES:  None on file   CODE STATUS: Full code  PAST MEDICAL HISTORY: Past Medical History:  Diagnosis Date   Cancer (HCC) 1998   thyroid  ca   Degenerative arthritis of toe joint, right    great toe   Endometrial cancer (HCC)    Endometrial mass 10/15/2016   History of radiation therapy 01/18/17-02/23/17   vaginal cuff 30 Gy in 5 fractions   Hypothyroidism    Neuromuscular disorder (HCC)     neuropathy   Neuropathy    feet   Postmenopausal bleeding 10/15/2016    ALLERGIES:  is allergic to celebrex [celecoxib], gabapentin , and meloxicam .  MEDICATIONS:  Current Outpatient Medications  Medication Sig Dispense Refill   ondansetron  (ZOFRAN -ODT) 8 MG disintegrating tablet Take 8 mg by mouth every 8 (eight) hours as needed for nausea.     cyclobenzaprine (FLEXERIL) 10 MG tablet SMARTSIG:0.5-1 Tablet(s) By Mouth 2-3 Times Daily PRN     levothyroxine  (SYNTHROID ) 137 MCG tablet Take by mouth.     OVER THE COUNTER MEDICATION Take 5 tablets by mouth 3 (three) times a week. Pyridoxal 1, 2, 3, and 4 otc supplement  Vitamin package     OVER THE COUNTER MEDICATION Take 1 tablet by mouth as directed. Sulfur 24 g7 otc supplement take 1 tablet daily 5 days out of the week  Adrenaline gland support     OVER THE COUNTER MEDICATION Take 1 capsule by mouth See admin instructions. Carbon 90 bx otc supplement take 1 capsule daily 5 days out of the week  Omega 3 supplement     oxyCODONE  (OXY IR/ROXICODONE ) 5 MG immediate release tablet Take 1 tablet (5 mg total) by mouth every 8 (eight) hours as needed for severe pain (pain score 7-10) or moderate pain (pain score 4-6). 60 tablet 0   venlafaxine  XR (EFFEXOR -XR) 37.5 MG 24 hr capsule Take 1 capsule (37.5 mg total) by mouth daily with breakfast. 90 capsule 1   No current facility-administered medications for this visit.    VITAL SIGNS: There were no vitals taken for  this visit. There were no vitals filed for this visit.   Estimated body mass index is 20.53 kg/m as calculated from the following:   Height as of 01/31/24: 5' 8 (1.727 m).   Weight as of 01/31/24: 135 lb (61.2 kg).   PERFORMANCE STATUS (ECOG) : 1 - Symptomatic but completely ambulatory  Assessment NAD RRR Normal breathing pattern AAo x3  IMPRESSION: Discussed the use of AI scribe software for clinical note transcription with the patient, who gave verbal consent to  proceed.  History of Present Illness [name] is a 73 year old female with endometrial cancer who I connected with by phone for symptom management follow-up. She has been doing well overall. Continues to be challenged with neuropathy in feet and some ankle discomfort. Denies concerns of nausea, vomiting, constipation, or diarrhea. Occasional fatigue however remains active.  She has been experiencing nausea, which she suspects may be related to her long-term use of ibuprofen. She has been taking ibuprofen for several years to manage inflammation, particularly during the daytime, as she prefers not to use stronger pain medications like oxycodone  while driving. Recently, she stopped taking ibuprofen to see if her symptoms would improve, which has been challenging as it was effective in managing her inflammation. During a recent visit the provider advised use of Tylenol  which she tried and also experienced some nausea. Symptoms are controlled with anti-emetics (Zofran ).   She has recently completed a treatment (Sonowave) involving sound waves for her left ankle, which she initially started to address her neuropathy. She is currently in a 10-week period where stem cells are expected to aid in the healing of her left ankle.   Mrs. Point is managing her neuropathic discomfort with oxycodone  as needed. She is also using the Nerve Balm which does feel as thought it is providing some relief. No adjustments to current regimen at this time. We will continue to closely monitor and adjust regimen as needed. All questions answered and support provided.   Assessment & Plan Peripheral neuropathy with chronic pain of feet and ankles Chronic peripheral neuropathy with significant pain in feet and ankles, exacerbated by wearing socks and physical activity.  Currently using low-dose oxycodone  for pain management, providing some relief without significant side effects. She is cautious about medication use but finds  oxycodone  helpful for nighttime pain. - Recently completed sessions of Sonowave therapy for ankle pain and tissue strengthening. - Continue low-dose oxycodone  for pain management as needed, primarily at night. - Scheduled follow-up appointment in six weeks to reassess pain management and treatment efficacy.  Patient expressed understanding and was in agreement with this plan. She also understands that She can call the clinic at any time with any questions, concerns, or complaints.   Any controlled substances utilized were prescribed in the context of palliative care. PDMP has been reviewed.   I provided 30 minutes of non face-to-face telephone visit time during this encounter, and > 50% was spent counseling as documented under my assessment & plan. Visit consisted of counseling and education dealing with the complex and emotionally intense issues of symptom management and palliative care in the setting of serious and potentially life-threatening illness.  Levon Borer, AGPCNP-BC  Palliative Medicine Team/[facility]    "

## 2024-04-10 ENCOUNTER — Other Ambulatory Visit: Payer: Self-pay | Admitting: Nurse Practitioner

## 2024-04-10 DIAGNOSIS — M792 Neuralgia and neuritis, unspecified: Secondary | ICD-10-CM

## 2024-04-10 DIAGNOSIS — C541 Malignant neoplasm of endometrium: Secondary | ICD-10-CM

## 2024-04-10 DIAGNOSIS — G893 Neoplasm related pain (acute) (chronic): Secondary | ICD-10-CM

## 2024-04-10 DIAGNOSIS — Z515 Encounter for palliative care: Secondary | ICD-10-CM

## 2024-04-10 MED ORDER — OXYCODONE HCL 5 MG PO TABS: 5.0000 mg | ORAL_TABLET | Freq: Three times a day (TID) | ORAL | 0 refills | Status: AC | PRN

## 2024-04-24 ENCOUNTER — Inpatient Hospital Stay: Attending: Nurse Practitioner | Admitting: Nurse Practitioner

## 2024-09-04 ENCOUNTER — Ambulatory Visit: Admitting: Gynecologic Oncology
